# Patient Record
Sex: Male | Born: 1950 | Race: White | Hispanic: No | Marital: Married | Smoking: Never smoker
Health system: Southern US, Community
[De-identification: ages and names within clinical notes are randomized; demographics above are authoritative.]

## PROBLEM LIST (undated history)

## (undated) DIAGNOSIS — K2270 Barrett's esophagus without dysplasia: Secondary | ICD-10-CM

## (undated) DIAGNOSIS — E119 Type 2 diabetes mellitus without complications: Secondary | ICD-10-CM

## (undated) DIAGNOSIS — M1990 Unspecified osteoarthritis, unspecified site: Secondary | ICD-10-CM

## (undated) DIAGNOSIS — H409 Unspecified glaucoma: Secondary | ICD-10-CM

## (undated) DIAGNOSIS — K589 Irritable bowel syndrome without diarrhea: Secondary | ICD-10-CM

## (undated) DIAGNOSIS — I2699 Other pulmonary embolism without acute cor pulmonale: Secondary | ICD-10-CM

## (undated) DIAGNOSIS — Z9889 Other specified postprocedural states: Secondary | ICD-10-CM

## (undated) DIAGNOSIS — R0600 Dyspnea, unspecified: Secondary | ICD-10-CM

## (undated) DIAGNOSIS — F32A Depression, unspecified: Secondary | ICD-10-CM

## (undated) DIAGNOSIS — G8929 Other chronic pain: Secondary | ICD-10-CM

## (undated) DIAGNOSIS — C801 Malignant (primary) neoplasm, unspecified: Secondary | ICD-10-CM

## (undated) DIAGNOSIS — K5790 Diverticulosis of intestine, part unspecified, without perforation or abscess without bleeding: Secondary | ICD-10-CM

## (undated) DIAGNOSIS — E041 Nontoxic single thyroid nodule: Secondary | ICD-10-CM

## (undated) DIAGNOSIS — F329 Major depressive disorder, single episode, unspecified: Secondary | ICD-10-CM

## (undated) DIAGNOSIS — G4730 Sleep apnea, unspecified: Secondary | ICD-10-CM

## (undated) DIAGNOSIS — I499 Cardiac arrhythmia, unspecified: Secondary | ICD-10-CM

## (undated) DIAGNOSIS — M549 Dorsalgia, unspecified: Secondary | ICD-10-CM

## (undated) DIAGNOSIS — I4891 Unspecified atrial fibrillation: Secondary | ICD-10-CM

## (undated) DIAGNOSIS — K219 Gastro-esophageal reflux disease without esophagitis: Secondary | ICD-10-CM

## (undated) DIAGNOSIS — E162 Hypoglycemia, unspecified: Secondary | ICD-10-CM

## (undated) DIAGNOSIS — K5792 Diverticulitis of intestine, part unspecified, without perforation or abscess without bleeding: Secondary | ICD-10-CM

## (undated) DIAGNOSIS — I10 Essential (primary) hypertension: Secondary | ICD-10-CM

## (undated) DIAGNOSIS — E079 Disorder of thyroid, unspecified: Secondary | ICD-10-CM

## (undated) DIAGNOSIS — R011 Cardiac murmur, unspecified: Secondary | ICD-10-CM

## (undated) DIAGNOSIS — J189 Pneumonia, unspecified organism: Secondary | ICD-10-CM

## (undated) DIAGNOSIS — Z87442 Personal history of urinary calculi: Secondary | ICD-10-CM

## (undated) DIAGNOSIS — E785 Hyperlipidemia, unspecified: Secondary | ICD-10-CM

## (undated) DIAGNOSIS — J329 Chronic sinusitis, unspecified: Secondary | ICD-10-CM

## (undated) DIAGNOSIS — E0590 Thyrotoxicosis, unspecified without thyrotoxic crisis or storm: Secondary | ICD-10-CM

## (undated) HISTORY — PX: CARDIAC ELECTROPHYSIOLOGY MAPPING AND ABLATION: SHX1292

## (undated) HISTORY — PX: OTHER SURGICAL HISTORY: SHX169

## (undated) HISTORY — PX: TOOTH EXTRACTION: SUR596

## (undated) HISTORY — PX: COLON SURGERY: SHX602

## (undated) HISTORY — DX: Unspecified atrial fibrillation: I48.91

## (undated) HISTORY — DX: Gastro-esophageal reflux disease without esophagitis: K21.9

## (undated) HISTORY — DX: Type 2 diabetes mellitus without complications: E11.9

## (undated) HISTORY — DX: Essential (primary) hypertension: I10

## (undated) HISTORY — DX: Hyperlipidemia, unspecified: E78.5

## (undated) HISTORY — DX: Disorder of thyroid, unspecified: E07.9

## (undated) HISTORY — PX: TONSILLECTOMY: SUR1361

## (undated) HISTORY — PX: COLONOSCOPY: SHX174

## (undated) HISTORY — DX: Cardiac murmur, unspecified: R01.1

---
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
## 2006-09-28 ENCOUNTER — Other Ambulatory Visit: Payer: Self-pay

## 2006-09-28 ENCOUNTER — Inpatient Hospital Stay: Payer: Self-pay | Admitting: Unknown Physician Specialty

## 2006-10-27 ENCOUNTER — Ambulatory Visit: Payer: Self-pay | Admitting: Cardiology

## 2006-11-19 ENCOUNTER — Ambulatory Visit: Payer: Self-pay

## 2009-02-02 ENCOUNTER — Ambulatory Visit: Payer: Self-pay | Admitting: Family Medicine

## 2009-02-20 ENCOUNTER — Ambulatory Visit: Payer: Self-pay | Admitting: Unknown Physician Specialty

## 2009-03-13 ENCOUNTER — Inpatient Hospital Stay: Payer: Self-pay | Admitting: Internal Medicine

## 2009-04-18 ENCOUNTER — Ambulatory Visit: Payer: Self-pay | Admitting: Family Medicine

## 2009-06-12 ENCOUNTER — Ambulatory Visit: Payer: Self-pay | Admitting: Gastroenterology

## 2009-06-29 ENCOUNTER — Ambulatory Visit: Payer: Self-pay | Admitting: Gastroenterology

## 2010-02-05 ENCOUNTER — Inpatient Hospital Stay: Payer: Self-pay | Admitting: Internal Medicine

## 2010-05-23 ENCOUNTER — Ambulatory Visit: Payer: Self-pay | Admitting: Gastroenterology

## 2010-06-11 ENCOUNTER — Ambulatory Visit: Payer: Self-pay | Admitting: Unknown Physician Specialty

## 2010-07-24 ENCOUNTER — Ambulatory Visit: Payer: Self-pay | Admitting: Gastroenterology

## 2010-07-29 LAB — PATHOLOGY REPORT

## 2010-08-14 ENCOUNTER — Ambulatory Visit: Payer: Self-pay | Admitting: Gastroenterology

## 2011-06-26 ENCOUNTER — Ambulatory Visit: Payer: Self-pay | Admitting: Internal Medicine

## 2011-10-02 ENCOUNTER — Ambulatory Visit: Payer: Self-pay | Admitting: Gastroenterology

## 2012-05-16 HISTORY — PX: OTHER SURGICAL HISTORY: SHX169

## 2012-05-26 ENCOUNTER — Inpatient Hospital Stay: Payer: Self-pay | Admitting: Surgery

## 2012-05-26 LAB — COMPREHENSIVE METABOLIC PANEL
Alkaline Phosphatase: 80 U/L (ref 50–136)
Anion Gap: 8 (ref 7–16)
BUN: 9 mg/dL (ref 7–18)
Bilirubin,Total: 1.2 mg/dL — ABNORMAL HIGH (ref 0.2–1.0)
Chloride: 101 mmol/L (ref 98–107)
Co2: 27 mmol/L (ref 21–32)
Creatinine: 0.88 mg/dL (ref 0.60–1.30)
Potassium: 3.7 mmol/L (ref 3.5–5.1)

## 2012-05-26 LAB — URINALYSIS, COMPLETE
Bacteria: NONE SEEN
Blood: NEGATIVE
Glucose,UR: NEGATIVE mg/dL (ref 0–75)
Leukocyte Esterase: NEGATIVE
RBC,UR: <1 /HPF (ref 0–5)
Squamous Epithelial: NONE SEEN
WBC UR: <1 /HPF (ref 0–5)

## 2012-05-26 LAB — CBC
HCT: 45.6 % (ref 40.0–52.0)
HGB: 15.2 g/dL (ref 13.0–18.0)
MCH: 33.6 pg (ref 26.0–34.0)
MCHC: 33.4 g/dL (ref 32.0–36.0)
Platelet: 331 10*3/uL (ref 150–440)
RDW: 13.8 % (ref 11.5–14.5)

## 2012-05-26 LAB — LIPASE, BLOOD: Lipase: 58 U/L — ABNORMAL LOW (ref 73–393)

## 2012-05-27 LAB — CBC WITH DIFFERENTIAL/PLATELET
Basophil %: 0.2 %
Eosinophil #: 0 10*3/uL (ref 0.0–0.7)
Eosinophil %: 0 %
Lymphocyte %: 8.1 %
MCH: 33.2 pg (ref 26.0–34.0)
MCHC: 33.1 g/dL (ref 32.0–36.0)
MCV: 100 fL (ref 80–100)
Monocyte #: 1.1 x10 3/mm — ABNORMAL HIGH (ref 0.2–1.0)
Monocyte %: 6 %
Neutrophil #: 15.5 10*3/uL — ABNORMAL HIGH (ref 1.4–6.5)
Neutrophil %: 85.7 %
RDW: 13.6 % (ref 11.5–14.5)
WBC: 18.1 10*3/uL — ABNORMAL HIGH (ref 3.8–10.6)

## 2012-05-27 LAB — BASIC METABOLIC PANEL
Anion Gap: 10 (ref 7–16)
BUN: 8 mg/dL (ref 7–18)
Chloride: 101 mmol/L (ref 98–107)
Creatinine: 0.9 mg/dL (ref 0.60–1.30)
Glucose: 133 mg/dL — ABNORMAL HIGH (ref 65–99)
Potassium: 3.2 mmol/L — ABNORMAL LOW (ref 3.5–5.1)

## 2012-05-28 LAB — CBC WITH DIFFERENTIAL/PLATELET
Basophil #: 0.1 10*3/uL (ref 0.0–0.1)
Basophil %: 0.4 %
Eosinophil #: 0.1 10*3/uL (ref 0.0–0.7)
Eosinophil %: 0.3 %
HCT: 36 % — ABNORMAL LOW (ref 40.0–52.0)
HGB: 12.3 g/dL — ABNORMAL LOW (ref 13.0–18.0)
Lymphocyte #: 2.2 10*3/uL (ref 1.0–3.6)
MCH: 34.7 pg — ABNORMAL HIGH (ref 26.0–34.0)
MCV: 101 fL — ABNORMAL HIGH (ref 80–100)
Monocyte %: 5.2 %
Neutrophil %: 85.2 %
Platelet: 245 10*3/uL (ref 150–440)
RBC: 3.56 10*6/uL — ABNORMAL LOW (ref 4.40–5.90)

## 2012-05-28 LAB — BASIC METABOLIC PANEL
Anion Gap: 9 (ref 7–16)
BUN: 6 mg/dL — ABNORMAL LOW (ref 7–18)
Calcium, Total: 8.4 mg/dL — ABNORMAL LOW (ref 8.5–10.1)
Chloride: 104 mmol/L (ref 98–107)
Creatinine: 0.8 mg/dL (ref 0.60–1.30)
EGFR (African American): >60
Glucose: 102 mg/dL — ABNORMAL HIGH (ref 65–99)
Sodium: 136 mmol/L (ref 136–145)

## 2012-05-28 LAB — T4, FREE: Free Thyroxine: 0.99 ng/dL (ref 0.76–1.46)

## 2012-05-28 LAB — MAGNESIUM: Magnesium: 1.6 mg/dL — ABNORMAL LOW

## 2012-05-28 LAB — TROPONIN I: Troponin-I: <0.02 ng/mL

## 2012-05-29 LAB — CK TOTAL AND CKMB (NOT AT ARMC)
CK, Total: 80 U/L (ref 35–232)
CK, Total: 79 U/L (ref 35–232)
CK-MB: <0.5 ng/mL — ABNORMAL LOW (ref 0.5–3.6)
CK-MB: <0.5 ng/mL — ABNORMAL LOW (ref 0.5–3.6)

## 2012-05-29 LAB — CBC WITH DIFFERENTIAL/PLATELET
Eosinophil #: 0.1 10*3/uL (ref 0.0–0.7)
Eosinophil %: 0.8 %
HCT: 36.7 % — ABNORMAL LOW (ref 40.0–52.0)
HGB: 12 g/dL — ABNORMAL LOW (ref 13.0–18.0)
MCH: 33.3 pg (ref 26.0–34.0)
MCV: 102 fL — ABNORMAL HIGH (ref 80–100)
Monocyte #: 1.1 x10 3/mm — ABNORMAL HIGH (ref 0.2–1.0)
Monocyte %: 6.1 %
Neutrophil #: 15.8 10*3/uL — ABNORMAL HIGH (ref 1.4–6.5)
RDW: 13.9 % (ref 11.5–14.5)
WBC: 18.8 10*3/uL — ABNORMAL HIGH (ref 3.8–10.6)

## 2012-05-29 LAB — BASIC METABOLIC PANEL
Chloride: 103 mmol/L (ref 98–107)
Co2: 24 mmol/L (ref 21–32)
Creatinine: 0.69 mg/dL (ref 0.60–1.30)
Glucose: 101 mg/dL — ABNORMAL HIGH (ref 65–99)
Osmolality: 270 (ref 275–301)
Sodium: 136 mmol/L (ref 136–145)

## 2012-05-29 LAB — TROPONIN I: Troponin-I: <0.02 ng/mL

## 2012-05-30 LAB — BASIC METABOLIC PANEL
Anion Gap: 9 (ref 7–16)
BUN: 8 mg/dL (ref 7–18)
Chloride: 100 mmol/L (ref 98–107)
Creatinine: 0.72 mg/dL (ref 0.60–1.30)
Glucose: 91 mg/dL (ref 65–99)
Osmolality: 264 (ref 275–301)
Potassium: 3.9 mmol/L (ref 3.5–5.1)

## 2012-05-31 LAB — CBC WITH DIFFERENTIAL/PLATELET
Basophil #: 0.1 10*3/uL (ref 0.0–0.1)
Basophil %: 0.5 %
Eosinophil #: 0 10*3/uL (ref 0.0–0.7)
Eosinophil %: 0.2 %
Lymphocyte %: 8.9 %
MCH: 34.2 pg — ABNORMAL HIGH (ref 26.0–34.0)
MCHC: 33.5 g/dL (ref 32.0–36.0)
Monocyte #: 1.4 x10 3/mm — ABNORMAL HIGH (ref 0.2–1.0)
Monocyte %: 8.6 %
Neutrophil #: 13.2 10*3/uL — ABNORMAL HIGH (ref 1.4–6.5)
Neutrophil %: 81.8 %
Platelet: 339 10*3/uL (ref 150–440)
RBC: 3.85 10*6/uL — ABNORMAL LOW (ref 4.40–5.90)
WBC: 16.2 10*3/uL — ABNORMAL HIGH (ref 3.8–10.6)

## 2012-05-31 LAB — COMPREHENSIVE METABOLIC PANEL
Albumin: 2 g/dL — ABNORMAL LOW (ref 3.4–5.0)
BUN: 10 mg/dL (ref 7–18)
Bilirubin,Total: 1 mg/dL (ref 0.2–1.0)
Calcium, Total: 7.9 mg/dL — ABNORMAL LOW (ref 8.5–10.1)
Co2: 24 mmol/L (ref 21–32)
Creatinine: 0.6 mg/dL (ref 0.60–1.30)
SGOT(AST): 19 U/L (ref 15–37)
Sodium: 137 mmol/L (ref 136–145)
Total Protein: 6 g/dL — ABNORMAL LOW (ref 6.4–8.2)

## 2012-06-01 LAB — CBC WITH DIFFERENTIAL/PLATELET
Basophil: 2 %
Eosinophil: 1 %
HCT: 36.9 % — ABNORMAL LOW (ref 40.0–52.0)
Lymphocytes: 14 %
Monocytes: 12 %
Myelocyte: 3 %
Other Cells Blood: 1
Platelet: 419 10*3/uL (ref 150–440)
RBC: 3.65 10*6/uL — ABNORMAL LOW (ref 4.40–5.90)
WBC: 16.6 10*3/uL — ABNORMAL HIGH (ref 3.8–10.6)

## 2012-06-01 LAB — BASIC METABOLIC PANEL
Calcium, Total: 7.9 mg/dL — ABNORMAL LOW (ref 8.5–10.1)
Creatinine: 0.68 mg/dL (ref 0.60–1.30)
EGFR (Non-African Amer.): >60
Glucose: 113 mg/dL — ABNORMAL HIGH (ref 65–99)
Osmolality: 273 (ref 275–301)
Potassium: 4 mmol/L (ref 3.5–5.1)

## 2012-06-03 LAB — PATHOLOGY REPORT

## 2012-06-05 DIAGNOSIS — R079 Chest pain, unspecified: Secondary | ICD-10-CM

## 2012-06-05 LAB — COMPREHENSIVE METABOLIC PANEL
Albumin: 2.1 g/dL — ABNORMAL LOW (ref 3.4–5.0)
Alkaline Phosphatase: 117 U/L (ref 50–136)
Anion Gap: 9 (ref 7–16)
BUN: 4 mg/dL — ABNORMAL LOW (ref 7–18)
Calcium, Total: 7.9 mg/dL — ABNORMAL LOW (ref 8.5–10.1)
Chloride: 103 mmol/L (ref 98–107)
EGFR (Non-African Amer.): >60
Potassium: 2.9 mmol/L — ABNORMAL LOW (ref 3.5–5.1)
SGPT (ALT): 34 U/L
Sodium: 140 mmol/L (ref 136–145)

## 2012-06-05 LAB — CBC WITH DIFFERENTIAL/PLATELET
Basophil #: 0.2 10*3/uL — ABNORMAL HIGH (ref 0.0–0.1)
Basophil %: 0.6 %
Eosinophil #: 0.7 10*3/uL (ref 0.0–0.7)
Eosinophil %: 2.3 %
HCT: 35.9 % — ABNORMAL LOW (ref 40.0–52.0)
HGB: 11.7 g/dL — ABNORMAL LOW (ref 13.0–18.0)
Lymphocyte #: 1.5 10*3/uL (ref 1.0–3.6)
MCV: 101 fL — ABNORMAL HIGH (ref 80–100)
Monocyte #: 1.3 x10 3/mm — ABNORMAL HIGH (ref 0.2–1.0)
Monocyte %: 4.4 %
Neutrophil %: 87.7 %
Platelet: 762 10*3/uL — ABNORMAL HIGH (ref 150–440)
RBC: 3.56 10*6/uL — ABNORMAL LOW (ref 4.40–5.90)
WBC: 29.7 10*3/uL — ABNORMAL HIGH (ref 3.8–10.6)

## 2012-06-06 LAB — CBC WITH DIFFERENTIAL/PLATELET
Basophil %: 0.4 %
Eosinophil #: 0.9 10*3/uL — ABNORMAL HIGH (ref 0.0–0.7)
Lymphocyte #: 1.7 10*3/uL (ref 1.0–3.6)
Lymphocyte %: 7.5 %
MCH: 34.1 pg — ABNORMAL HIGH (ref 26.0–34.0)
MCV: 100 fL (ref 80–100)
Platelet: 791 10*3/uL — ABNORMAL HIGH (ref 150–440)
WBC: 22.4 10*3/uL — ABNORMAL HIGH (ref 3.8–10.6)

## 2012-06-06 LAB — BASIC METABOLIC PANEL
BUN: 4 mg/dL — ABNORMAL LOW (ref 7–18)
Calcium, Total: 8.2 mg/dL — ABNORMAL LOW (ref 8.5–10.1)
Co2: 27 mmol/L (ref 21–32)
Creatinine: 0.93 mg/dL (ref 0.60–1.30)
EGFR (African American): >60
EGFR (Non-African Amer.): >60
Osmolality: 278 (ref 275–301)
Potassium: 3.5 mmol/L (ref 3.5–5.1)
Sodium: 141 mmol/L (ref 136–145)

## 2012-06-07 LAB — CBC WITH DIFFERENTIAL/PLATELET
Basophil #: 0.1 10*3/uL (ref 0.0–0.1)
Basophil %: 0.5 %
Eosinophil #: 0.6 10*3/uL (ref 0.0–0.7)
Eosinophil %: 3.4 %
HCT: 33.1 % — ABNORMAL LOW (ref 40.0–52.0)
Lymphocyte #: 1.9 10*3/uL (ref 1.0–3.6)
MCH: 33.3 pg (ref 26.0–34.0)
MCV: 100 fL (ref 80–100)
Monocyte %: 9.5 %
Neutrophil #: 13.1 10*3/uL — ABNORMAL HIGH (ref 1.4–6.5)
Neutrophil %: 75.6 %
Platelet: 811 10*3/uL — ABNORMAL HIGH (ref 150–440)
RBC: 3.31 10*6/uL — ABNORMAL LOW (ref 4.40–5.90)
WBC: 17.3 10*3/uL — ABNORMAL HIGH (ref 3.8–10.6)

## 2012-06-07 LAB — BASIC METABOLIC PANEL
BUN: 4 mg/dL — ABNORMAL LOW (ref 7–18)
Calcium, Total: 8.1 mg/dL — ABNORMAL LOW (ref 8.5–10.1)
Chloride: 104 mmol/L (ref 98–107)
Creatinine: 0.97 mg/dL (ref 0.60–1.30)
EGFR (African American): >60
EGFR (Non-African Amer.): >60
Glucose: 103 mg/dL — ABNORMAL HIGH (ref 65–99)
Osmolality: 275 (ref 275–301)
Sodium: 139 mmol/L (ref 136–145)

## 2012-06-09 LAB — CBC WITH DIFFERENTIAL/PLATELET
Basophil #: 0.2 10*3/uL — ABNORMAL HIGH (ref 0.0–0.1)
Basophil %: 1.2 %
Eosinophil #: 0.6 10*3/uL (ref 0.0–0.7)
HCT: 35.5 % — ABNORMAL LOW (ref 40.0–52.0)
Lymphocyte %: 20.3 %
MCH: 33.2 pg (ref 26.0–34.0)
MCV: 101 fL — ABNORMAL HIGH (ref 80–100)
Monocyte #: 1.5 x10 3/mm — ABNORMAL HIGH (ref 0.2–1.0)
Monocyte %: 10.4 %
Neutrophil #: 9.1 10*3/uL — ABNORMAL HIGH (ref 1.4–6.5)
Neutrophil %: 63.7 %
Platelet: 944 10*3/uL — ABNORMAL HIGH (ref 150–440)
RDW: 14.5 % (ref 11.5–14.5)

## 2012-06-13 LAB — WOUND AEROBIC CULTURE

## 2012-06-15 HISTORY — PX: OTHER SURGICAL HISTORY: SHX169

## 2012-06-17 ENCOUNTER — Ambulatory Visit: Payer: Self-pay | Admitting: Surgery

## 2012-06-17 ENCOUNTER — Inpatient Hospital Stay: Payer: Self-pay | Admitting: Surgery

## 2012-06-17 LAB — CBC WITH DIFFERENTIAL/PLATELET
Basophil #: 0.2 10*3/uL — ABNORMAL HIGH (ref 0.0–0.1)
Eosinophil %: 3.9 %
HCT: 37 % — ABNORMAL LOW (ref 40.0–52.0)
HGB: 12.3 g/dL — ABNORMAL LOW (ref 13.0–18.0)
Lymphocyte %: 18.3 %
Monocyte %: 7.8 %
Neutrophil #: 8.8 10*3/uL — ABNORMAL HIGH (ref 1.4–6.5)
RBC: 3.74 10*6/uL — ABNORMAL LOW (ref 4.40–5.90)
RDW: 13.8 % (ref 11.5–14.5)

## 2012-06-17 LAB — BASIC METABOLIC PANEL
BUN: 8 mg/dL (ref 7–18)
Chloride: 103 mmol/L (ref 98–107)
Glucose: 97 mg/dL (ref 65–99)
Osmolality: 272 (ref 275–301)
Potassium: 4.2 mmol/L (ref 3.5–5.1)

## 2012-06-18 LAB — BASIC METABOLIC PANEL
Anion Gap: 8 (ref 7–16)
BUN: 6 mg/dL — ABNORMAL LOW (ref 7–18)
Calcium, Total: 8.4 mg/dL — ABNORMAL LOW (ref 8.5–10.1)
Chloride: 104 mmol/L (ref 98–107)
Co2: 26 mmol/L (ref 21–32)
Creatinine: 0.89 mg/dL (ref 0.60–1.30)
Osmolality: 273 (ref 275–301)
Potassium: 3.7 mmol/L (ref 3.5–5.1)
Sodium: 138 mmol/L (ref 136–145)

## 2012-06-18 LAB — CBC WITH DIFFERENTIAL/PLATELET
Basophil #: 0.2 10*3/uL — ABNORMAL HIGH (ref 0.0–0.1)
Basophil %: 2 %
Eosinophil %: 5.8 %
HCT: 33.9 % — ABNORMAL LOW (ref 40.0–52.0)
HGB: 11.5 g/dL — ABNORMAL LOW (ref 13.0–18.0)
Lymphocyte #: 3.4 10*3/uL (ref 1.0–3.6)
Lymphocyte %: 28.1 %
MCV: 98 fL (ref 80–100)
Monocyte #: 0.8 x10 3/mm (ref 0.2–1.0)
Monocyte %: 6.5 %
Neutrophil %: 57.6 %
Platelet: 553 10*3/uL — ABNORMAL HIGH (ref 150–440)
RBC: 3.47 10*6/uL — ABNORMAL LOW (ref 4.40–5.90)
RDW: 13.7 % (ref 11.5–14.5)
WBC: 12 10*3/uL — ABNORMAL HIGH (ref 3.8–10.6)

## 2012-06-21 LAB — WOUND CULTURE

## 2012-06-22 LAB — PLATELET COUNT: Platelet: 413 10*3/uL (ref 150–440)

## 2012-06-23 LAB — CBC WITH DIFFERENTIAL/PLATELET
Basophil #: 0.1 10*3/uL (ref 0.0–0.1)
Basophil %: 1.4 %
Eosinophil #: 1.1 10*3/uL — ABNORMAL HIGH (ref 0.0–0.7)
HCT: 36.1 % — ABNORMAL LOW (ref 40.0–52.0)
HGB: 12.3 g/dL — ABNORMAL LOW (ref 13.0–18.0)
Lymphocyte %: 19.1 %
MCHC: 34.1 g/dL (ref 32.0–36.0)
Monocyte %: 11.2 %
Neutrophil %: 57.6 %
Platelet: 425 10*3/uL (ref 150–440)
RBC: 3.72 10*6/uL — ABNORMAL LOW (ref 4.40–5.90)
RDW: 14.1 % (ref 11.5–14.5)
WBC: 10.1 10*3/uL (ref 3.8–10.6)

## 2012-06-23 LAB — APTT: Activated PTT: 32 secs (ref 23.6–35.9)

## 2012-06-24 LAB — CBC WITH DIFFERENTIAL/PLATELET
Basophil #: 0.1 10*3/uL (ref 0.0–0.1)
Basophil %: 1.3 %
Eosinophil #: 1.3 10*3/uL — ABNORMAL HIGH (ref 0.0–0.7)
Eosinophil %: 12.9 %
HGB: 11.3 g/dL — ABNORMAL LOW (ref 13.0–18.0)
Lymphocyte #: 3.1 10*3/uL (ref 1.0–3.6)
Lymphocyte %: 31 %
MCH: 32.6 pg (ref 26.0–34.0)
MCV: 98 fL (ref 80–100)
Monocyte %: 11.2 %
Neutrophil %: 43.6 %
Platelet: 378 10*3/uL (ref 150–440)
RBC: 3.48 10*6/uL — ABNORMAL LOW (ref 4.40–5.90)
WBC: 9.9 10*3/uL (ref 3.8–10.6)

## 2012-06-24 LAB — PROTIME-INR
INR: 1
Prothrombin Time: 14 secs (ref 11.5–14.7)

## 2012-06-25 LAB — CBC WITH DIFFERENTIAL/PLATELET
Basophil %: 1.2 %
Eosinophil %: 14.8 %
HCT: 35.8 % — ABNORMAL LOW (ref 40.0–52.0)
HGB: 11.7 g/dL — ABNORMAL LOW (ref 13.0–18.0)
Lymphocyte %: 28.2 %
MCHC: 32.8 g/dL (ref 32.0–36.0)
Monocyte %: 11.4 %
Neutrophil #: 4.2 10*3/uL (ref 1.4–6.5)
Neutrophil %: 44.4 %
Platelet: 409 10*3/uL (ref 150–440)
RBC: 3.64 10*6/uL — ABNORMAL LOW (ref 4.40–5.90)
WBC: 9.5 10*3/uL (ref 3.8–10.6)

## 2012-06-25 LAB — BASIC METABOLIC PANEL
Calcium, Total: 8.7 mg/dL (ref 8.5–10.1)
Co2: 27 mmol/L (ref 21–32)
EGFR (Non-African Amer.): >60
Glucose: 97 mg/dL (ref 65–99)
Osmolality: 274 (ref 275–301)
Potassium: 3.7 mmol/L (ref 3.5–5.1)

## 2012-06-25 LAB — CK TOTAL AND CKMB (NOT AT ARMC)
CK, Total: 35 U/L (ref 35–232)
CK, Total: 40 U/L (ref 35–232)
CK-MB: <0.5 ng/mL — ABNORMAL LOW (ref 0.5–3.6)

## 2012-06-25 LAB — APTT: Activated PTT: 49.2 secs — ABNORMAL HIGH (ref 23.6–35.9)

## 2012-06-25 LAB — TROPONIN I: Troponin-I: <0.02 ng/mL

## 2012-06-26 LAB — BASIC METABOLIC PANEL
Anion Gap: 8 (ref 7–16)
BUN: 3 mg/dL — ABNORMAL LOW (ref 7–18)
Calcium, Total: 8.8 mg/dL (ref 8.5–10.1)
Chloride: 104 mmol/L (ref 98–107)
Co2: 28 mmol/L (ref 21–32)
Creatinine: 0.7 mg/dL (ref 0.60–1.30)
EGFR (African American): >60
EGFR (Non-African Amer.): >60
Glucose: 102 mg/dL — ABNORMAL HIGH (ref 65–99)
Osmolality: 276 (ref 275–301)
Potassium: 3.7 mmol/L (ref 3.5–5.1)
Sodium: 140 mmol/L (ref 136–145)

## 2012-06-26 LAB — CBC WITH DIFFERENTIAL/PLATELET
Basophil #: 0.2 x10 3/mm 3 — ABNORMAL HIGH
Basophil %: 2.6 %
Eosinophil #: 1.2 x10 3/mm 3 — ABNORMAL HIGH
Eosinophil %: 14.2 %
HCT: 35.1 % — ABNORMAL LOW
HGB: 12.2 g/dL — ABNORMAL LOW
Lymphocyte %: 29.3 %
Lymphs Abs: 2.5 x10 3/mm 3
MCH: 33.5 pg
MCHC: 34.7 g/dL
MCV: 97 fL
Monocyte #: 1 "x10 3/mm "
Monocyte %: 10.9 %
Neutrophil #: 3.7 x10 3/mm 3
Neutrophil %: 43 %
Platelet: 397 x10 3/mm 3
RBC: 3.64 x10 6/mm 3 — ABNORMAL LOW
RDW: 14.2 %
WBC: 8.7 x10 3/mm 3

## 2012-06-26 LAB — APTT
Activated PTT: 51.2 s — ABNORMAL HIGH
Activated PTT: 68.3 secs — ABNORMAL HIGH (ref 23.6–35.9)

## 2012-06-26 LAB — CK TOTAL AND CKMB (NOT AT ARMC): CK-MB: <0.5 ng/mL — ABNORMAL LOW (ref 0.5–3.6)

## 2012-06-26 LAB — TROPONIN I: Troponin-I: <0.02 ng/mL

## 2012-12-14 LAB — URINALYSIS, COMPLETE
Bacteria: NONE SEEN
Bilirubin,UR: NEGATIVE
Blood: NEGATIVE
Glucose,UR: NEGATIVE mg/dL (ref 0–75)
Ketone: NEGATIVE
Nitrite: NEGATIVE
Protein: NEGATIVE
RBC,UR: <1 /HPF (ref 0–5)
Specific Gravity: 1.003 (ref 1.003–1.030)
Squamous Epithelial: <1
WBC UR: 1 /HPF (ref 0–5)

## 2012-12-14 LAB — LIPASE, BLOOD: Lipase: 66 U/L — ABNORMAL LOW (ref 73–393)

## 2012-12-14 LAB — COMPREHENSIVE METABOLIC PANEL
Anion Gap: 10 (ref 7–16)
BUN: 12 mg/dL (ref 7–18)
Calcium, Total: 9 mg/dL (ref 8.5–10.1)
Chloride: 103 mmol/L (ref 98–107)
Co2: 24 mmol/L (ref 21–32)
EGFR (African American): >60
EGFR (Non-African Amer.): >60
Potassium: 3.8 mmol/L (ref 3.5–5.1)
SGOT(AST): 36 U/L (ref 15–37)
Sodium: 137 mmol/L (ref 136–145)

## 2012-12-14 LAB — APTT: Activated PTT: 28.1 secs (ref 23.6–35.9)

## 2012-12-14 LAB — CBC
HGB: 13.3 g/dL (ref 13.0–18.0)
MCH: 32.4 pg (ref 26.0–34.0)
MCV: 97 fL (ref 80–100)
RBC: 4.1 10*6/uL — ABNORMAL LOW (ref 4.40–5.90)
RDW: 13.8 % (ref 11.5–14.5)
WBC: 13.3 10*3/uL — ABNORMAL HIGH (ref 3.8–10.6)

## 2012-12-15 ENCOUNTER — Inpatient Hospital Stay: Payer: Self-pay

## 2012-12-15 LAB — HEMOGLOBIN
HGB: 12.4 g/dL — ABNORMAL LOW (ref 13.0–18.0)
HGB: 13.5 g/dL (ref 13.0–18.0)

## 2012-12-16 LAB — CBC WITH DIFFERENTIAL/PLATELET
Eosinophil #: 0.6 10*3/uL (ref 0.0–0.7)
Eosinophil %: 6.2 %
HGB: 12.4 g/dL — ABNORMAL LOW (ref 13.0–18.0)
Lymphocyte #: 3.2 10*3/uL (ref 1.0–3.6)
Lymphocyte %: 32.8 %
MCHC: 33.4 g/dL (ref 32.0–36.0)
Monocyte #: 0.9 x10 3/mm (ref 0.2–1.0)
Neutrophil #: 4.9 10*3/uL (ref 1.4–6.5)
Platelet: 284 10*3/uL (ref 150–440)

## 2012-12-16 LAB — BASIC METABOLIC PANEL
Anion Gap: 10 (ref 7–16)
BUN: 8 mg/dL (ref 7–18)
Chloride: 104 mmol/L (ref 98–107)
Co2: 24 mmol/L (ref 21–32)
EGFR (African American): >60
EGFR (Non-African Amer.): >60
Osmolality: 273 (ref 275–301)
Sodium: 138 mmol/L (ref 136–145)

## 2012-12-28 ENCOUNTER — Ambulatory Visit: Payer: Self-pay | Admitting: Surgery

## 2013-01-16 HISTORY — PX: OTHER SURGICAL HISTORY: SHX169

## 2013-01-19 ENCOUNTER — Ambulatory Visit: Payer: Self-pay | Admitting: Surgery

## 2013-01-19 LAB — PROTIME-INR
INR: 1.1
Prothrombin Time: 14.9 secs — ABNORMAL HIGH (ref 11.5–14.7)

## 2013-01-26 ENCOUNTER — Inpatient Hospital Stay: Payer: Self-pay | Admitting: Surgery

## 2013-01-26 LAB — PROTIME-INR: INR: 1

## 2013-01-27 LAB — CBC WITH DIFFERENTIAL/PLATELET
Basophil #: 0 10*3/uL (ref 0.0–0.1)
Basophil %: 0.3 %
Eosinophil #: 0 10*3/uL (ref 0.0–0.7)
Eosinophil %: 0.1 %
HCT: 37.7 % — ABNORMAL LOW (ref 40.0–52.0)
Lymphocyte #: 1.7 10*3/uL (ref 1.0–3.6)
MCH: 32.4 pg (ref 26.0–34.0)
Monocyte #: 1.6 x10 3/mm — ABNORMAL HIGH (ref 0.2–1.0)
Monocyte %: 11 %
Neutrophil #: 11.2 10*3/uL — ABNORMAL HIGH (ref 1.4–6.5)
Neutrophil %: 76.6 %
RDW: 14.1 % (ref 11.5–14.5)
WBC: 14.6 10*3/uL — ABNORMAL HIGH (ref 3.8–10.6)

## 2013-01-27 LAB — BASIC METABOLIC PANEL
BUN: 7 mg/dL (ref 7–18)
Chloride: 105 mmol/L (ref 98–107)
Co2: 24 mmol/L (ref 21–32)
EGFR (African American): >60
Glucose: 128 mg/dL — ABNORMAL HIGH (ref 65–99)
Potassium: 3.2 mmol/L — ABNORMAL LOW (ref 3.5–5.1)

## 2013-01-28 LAB — CBC WITH DIFFERENTIAL/PLATELET
Basophil #: 0.1 10*3/uL (ref 0.0–0.1)
Basophil %: 0.4 %
Eosinophil #: 0 10*3/uL (ref 0.0–0.7)
HGB: 12.3 g/dL — ABNORMAL LOW (ref 13.0–18.0)
Lymphocyte %: 13.4 %
MCV: 97 fL (ref 80–100)
Monocyte #: 2.9 x10 3/mm — ABNORMAL HIGH (ref 0.2–1.0)
Monocyte %: 14.8 %
Neutrophil %: 71.3 %
RDW: 14.2 % (ref 11.5–14.5)
WBC: 19.3 10*3/uL — ABNORMAL HIGH (ref 3.8–10.6)

## 2013-01-28 LAB — BASIC METABOLIC PANEL WITH GFR
Anion Gap: 6 — ABNORMAL LOW (ref 7–16)
BUN: 5 mg/dL — ABNORMAL LOW (ref 7–18)
Calcium, Total: 8.3 mg/dL — ABNORMAL LOW (ref 8.5–10.1)
Chloride: 105 mmol/L (ref 98–107)
Co2: 25 mmol/L (ref 21–32)
Creatinine: 0.8 mg/dL (ref 0.60–1.30)
EGFR (African American): >60
EGFR (Non-African Amer.): >60
Glucose: 112 mg/dL — ABNORMAL HIGH (ref 65–99)
Osmolality: 270 (ref 275–301)
Potassium: 3.7 mmol/L (ref 3.5–5.1)
Sodium: 136 mmol/L (ref 136–145)

## 2013-01-28 LAB — MAGNESIUM: Magnesium: 1.4 mg/dL — ABNORMAL LOW

## 2013-01-29 LAB — CBC WITH DIFFERENTIAL/PLATELET
Eosinophil #: 0.4 10*3/uL (ref 0.0–0.7)
Eosinophil %: 2.5 %
HGB: 11.8 g/dL — ABNORMAL LOW (ref 13.0–18.0)
Lymphocyte %: 11.8 %
MCV: 98 fL (ref 80–100)
Monocyte #: 1.6 x10 3/mm — ABNORMAL HIGH (ref 0.2–1.0)
Neutrophil #: 11.5 10*3/uL — ABNORMAL HIGH (ref 1.4–6.5)
Platelet: 283 10*3/uL (ref 150–440)
RDW: 14.1 % (ref 11.5–14.5)

## 2013-01-29 LAB — BASIC METABOLIC PANEL
Anion Gap: 7 (ref 7–16)
Calcium, Total: 8.3 mg/dL — ABNORMAL LOW (ref 8.5–10.1)
Chloride: 107 mmol/L (ref 98–107)
EGFR (African American): >60
EGFR (Non-African Amer.): >60
Glucose: 122 mg/dL — ABNORMAL HIGH (ref 65–99)
Osmolality: 272 (ref 275–301)

## 2013-01-29 LAB — PATHOLOGY REPORT

## 2013-01-30 LAB — BASIC METABOLIC PANEL
Calcium, Total: 8.3 mg/dL — ABNORMAL LOW (ref 8.5–10.1)
Chloride: 107 mmol/L (ref 98–107)
Co2: 24 mmol/L (ref 21–32)
Creatinine: 0.7 mg/dL (ref 0.60–1.30)
EGFR (African American): >60

## 2013-01-30 LAB — CBC WITH DIFFERENTIAL/PLATELET
Basophil #: 0.2 10*3/uL — ABNORMAL HIGH (ref 0.0–0.1)
Basophil %: 1.3 %
Eosinophil #: 0.7 10*3/uL (ref 0.0–0.7)
Eosinophil %: 5.5 %
HGB: 11.9 g/dL — ABNORMAL LOW (ref 13.0–18.0)
MCH: 33.3 pg (ref 26.0–34.0)
MCHC: 34.1 g/dL (ref 32.0–36.0)
MCV: 98 fL (ref 80–100)
Neutrophil %: 61.3 %
Platelet: 327 10*3/uL (ref 150–440)
RDW: 14.3 % (ref 11.5–14.5)

## 2013-04-29 ENCOUNTER — Ambulatory Visit: Payer: Self-pay | Admitting: Surgery

## 2013-04-29 LAB — COMPREHENSIVE METABOLIC PANEL
Albumin: 3.4 g/dL (ref 3.4–5.0)
Alkaline Phosphatase: 87 U/L (ref 50–136)
BUN: 8 mg/dL (ref 7–18)
Bilirubin,Total: 0.3 mg/dL (ref 0.2–1.0)
Chloride: 103 mmol/L (ref 98–107)
Co2: 28 mmol/L (ref 21–32)
Creatinine: 0.88 mg/dL (ref 0.60–1.30)
EGFR (Non-African Amer.): >60
SGPT (ALT): 17 U/L (ref 12–78)
Sodium: 140 mmol/L (ref 136–145)

## 2013-04-29 LAB — CBC WITH DIFFERENTIAL/PLATELET
Basophil #: 0.2 10*3/uL — ABNORMAL HIGH (ref 0.0–0.1)
Basophil %: 1.7 %
Lymphocyte %: 22.1 %
Monocyte #: 0.8 x10 3/mm (ref 0.2–1.0)
Monocyte %: 7.6 %
Neutrophil %: 59.5 %
RDW: 17.3 % — ABNORMAL HIGH (ref 11.5–14.5)
WBC: 10.5 10*3/uL (ref 3.8–10.6)

## 2013-04-30 ENCOUNTER — Ambulatory Visit: Payer: Self-pay | Admitting: Surgery

## 2013-08-03 ENCOUNTER — Ambulatory Visit: Payer: Self-pay | Admitting: Gastroenterology

## 2013-08-04 LAB — PATHOLOGY REPORT

## 2013-11-16 IMAGING — CT CT ABD-PELV W/ CM
1 of 4 series · 11 of 32 positions shown, 17 images · IV contrast (isovue)
Comparison: none

REASON FOR EXAM: (1) LLQ PAIN; (2) LLQ PAIN
COMMENTS:

PROCEDURE:     CT  - CT ABDOMEN / PELVIS  W  - May 26, 2012  [DATE]
RESULT:     Comparison:  05/23/2010
TECHNIQUE: Multiple axial images of the abdomen and pelvis were performed
from the lung bases to the pubic symphysis, with p.o. contrast and with 100
mL of Isovue 300 intravenous contrast.

[Series 2: 3mm soft tissue · axial · 0.78mm/px · z∈[-988,-577]mm · 11 of 165 slices shown, 17 images]
[im 14/165  soft-tissue]
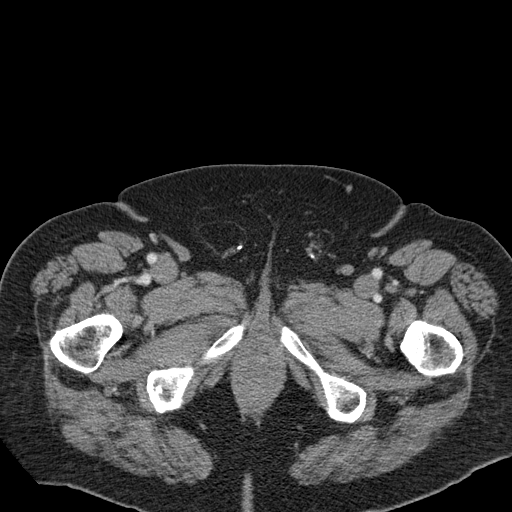
[im 14/165  bone]
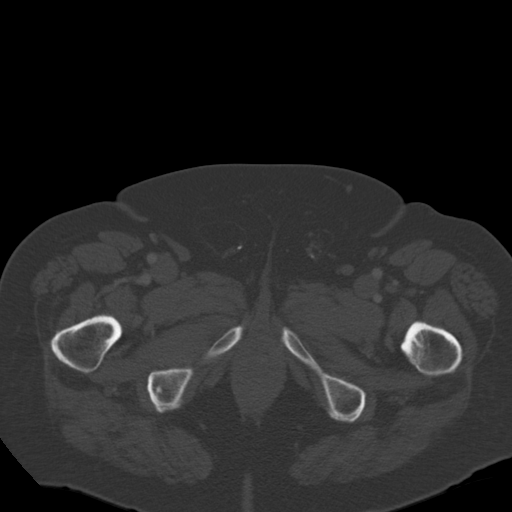
[im 28/165  soft-tissue]
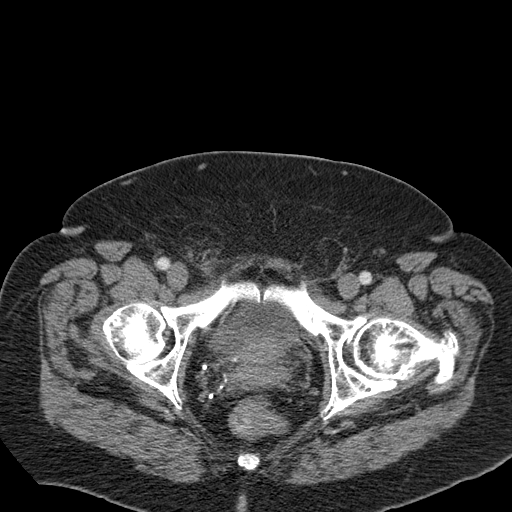
[im 42/165  soft-tissue]
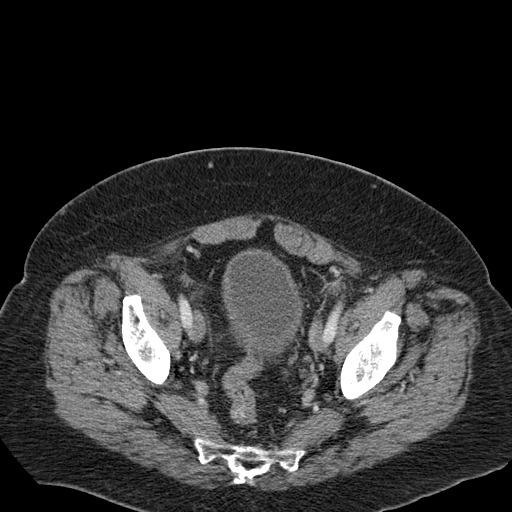
[im 55/165  soft-tissue]
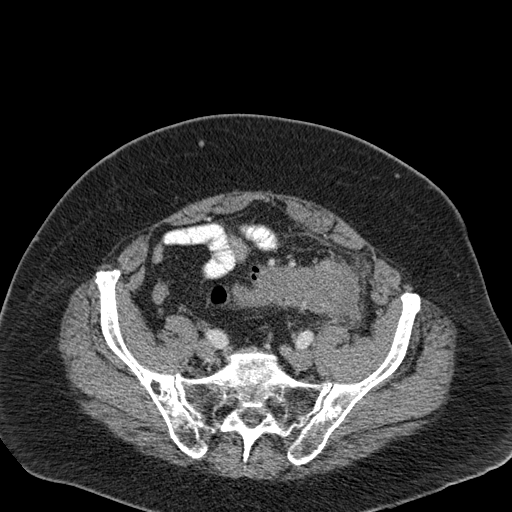
[im 69/165  soft-tissue]
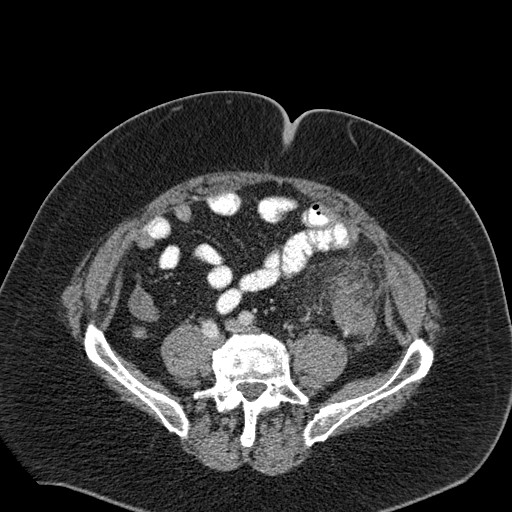
[im 83/165  soft-tissue]
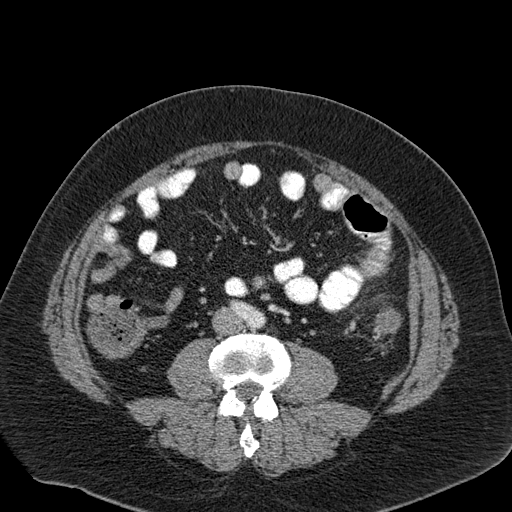
[im 96/165  soft-tissue]
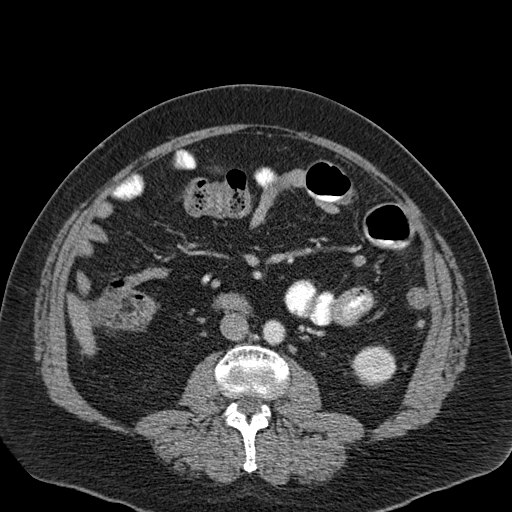
[im 110/165  soft-tissue]
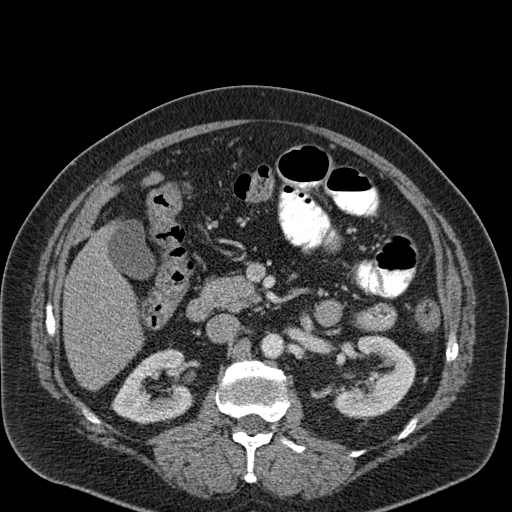
[im 110/165  lung]
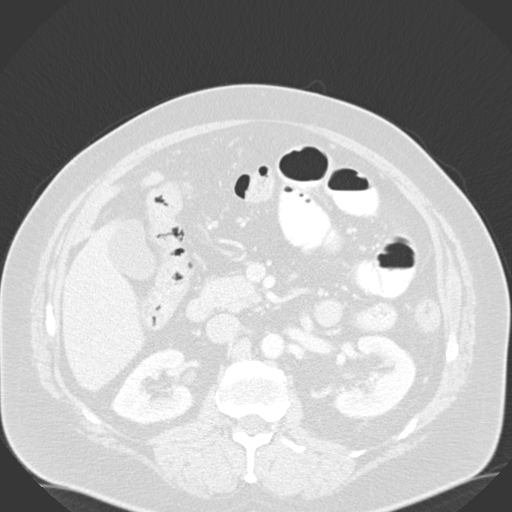
[im 124/165  soft-tissue]
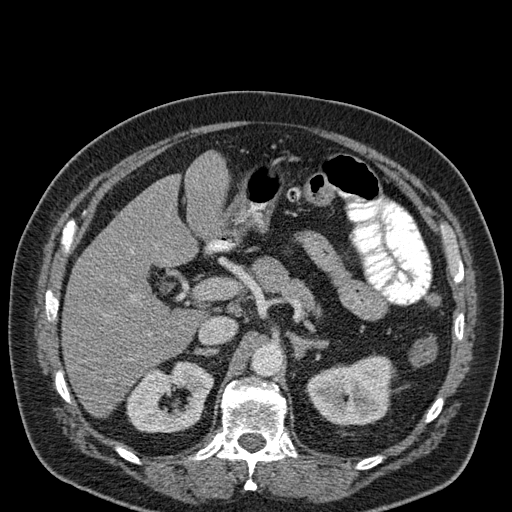
[im 124/165  lung]
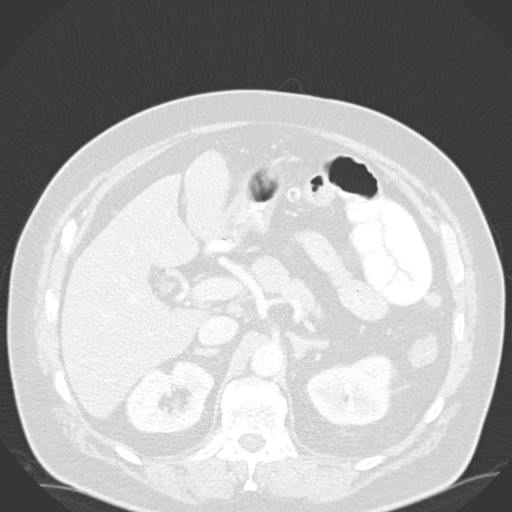
[im 124/165  bone]
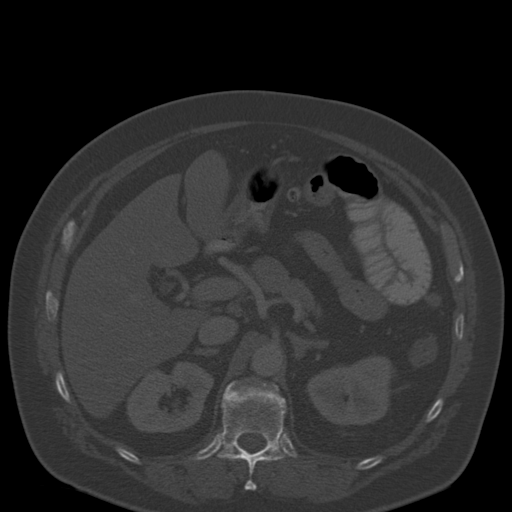
[im 137/165  soft-tissue]
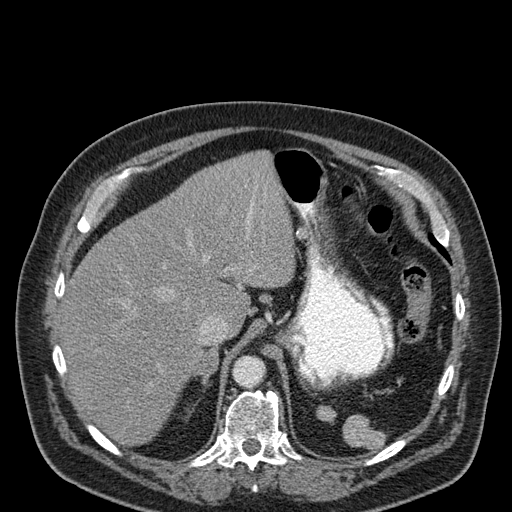
[im 137/165  lung]
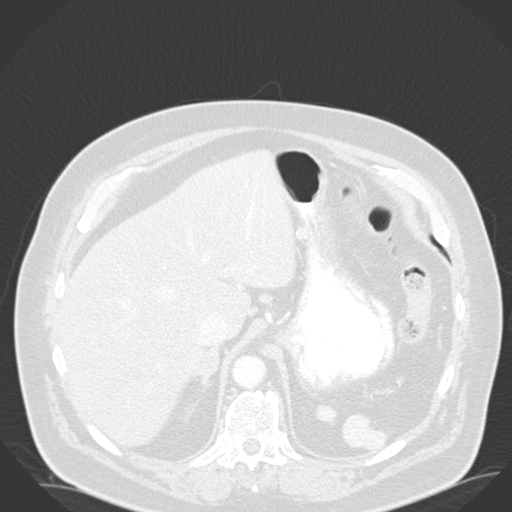
[im 151/165  soft-tissue]
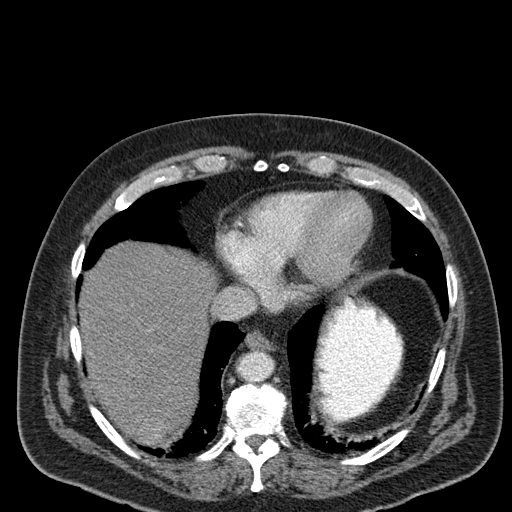
[im 151/165  lung]
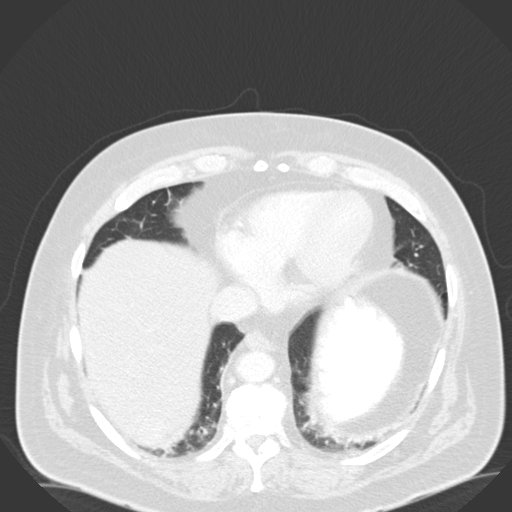

[11 of 32 positions shown; findings below may reference images not displayed]

FINDINGS: Mild basilar opacities are likely secondary to atelectasis. The liver is
relatively low in attenuation, suggesting hepatic steatosis. The
indeterminate slightly hyperenhancing lesion in the posterior right hepatic
lobe is similar to prior. It measures approximately 3.9 x 2.5 cm. The
gallbladder,  left adrenal gland, and pancreas are unremarkable. Mild
nodular thickening of the right adrenal gland is similar to prior. The
spleen is absent. There are multiple enhancing nodules in the left upper
quadrant which are similar to prior and likely represent splenules.
Additionally there are other enhancing small nodules in the left upper
quadrant as well is in the right upper quadrant adjacent to the liver which
are similar to prior. These enhance similar to the other splenules and
likely represent splenules themselves given their stability from prior.
Small low-attenuation lesions in the left kidney are indeterminate by
density criteria, but are similar to prior. These likely represent cysts
given their appearance on the prior abdominal MRI.

There are fat-containing bilateral inguinal hernias. There is diverticulosis
of the sigmoid colon. There is focal bowel wall thickening and adjacent
inflammatory stranding involving the sigmoid colon in the left lower
quadrant, which most likely represents acute diverticulitis. There are a few
tiny foci of adjacent extra luminal air concerning for microperforation. No
discrete extraluminal fluid collection identified. The small and large bowel
are normal in caliber. The appendix is normal.

Mixed areas of sclerosis and lucency in the bilateral iliac wings is similar
to prior and likely secondary to benign fibro-osseous lesions.
IMPRESSION: 1. Findings felt to represent acute sigmoid diverticulitis. There are a few
tiny foci of extraluminal air concerning for microperforation. No discrete
fluid collection seen. If the patient has not had a recent colonoscopy,
direct visualization is recommended after the acute episode, as malignancy
can have a similar appearance.
2. Other findings as above.

## 2014-02-28 ENCOUNTER — Ambulatory Visit: Payer: Self-pay | Admitting: Surgery

## 2014-03-22 DIAGNOSIS — K432 Incisional hernia without obstruction or gangrene: Secondary | ICD-10-CM | POA: Insufficient documentation

## 2014-03-24 ENCOUNTER — Encounter (INDEPENDENT_AMBULATORY_CARE_PROVIDER_SITE_OTHER): Payer: Self-pay | Admitting: General Surgery

## 2014-04-20 ENCOUNTER — Encounter (INDEPENDENT_AMBULATORY_CARE_PROVIDER_SITE_OTHER): Payer: Self-pay | Admitting: General Surgery

## 2014-04-20 ENCOUNTER — Ambulatory Visit (INDEPENDENT_AMBULATORY_CARE_PROVIDER_SITE_OTHER): Payer: PRIVATE HEALTH INSURANCE | Admitting: General Surgery

## 2014-04-20 VITALS — BP 122/70 | HR 76 | Resp 18 | Ht 69.0 in | Wt 248.0 lb

## 2014-04-20 DIAGNOSIS — K432 Incisional hernia without obstruction or gangrene: Secondary | ICD-10-CM | POA: Insufficient documentation

## 2014-04-20 NOTE — Patient Instructions (Signed)
i will call you once i review your CT Work on losing weight - walk daily
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
## 2014-04-25 ENCOUNTER — Telehealth (INDEPENDENT_AMBULATORY_CARE_PROVIDER_SITE_OTHER): Payer: Self-pay | Admitting: General Surgery

## 2014-04-25 NOTE — Telephone Encounter (Signed)
Called pt to discuss CT and his hernias. Has 2 incisional hernias; each about 8 x 3.5-4cm as best as I can recall from CT. i explained that while a pure laparoscopic approach is possible in my opinion it would not be probably his best long term option as I'm concerned his risk of recurrence would be decent in trying to bridge 2 decent sized incisional hernias with mesh only. i explained that I think his best option would probably be an open approach with permanent mesh although it would be a higher risk of wound issues. Given he has a prior component separation, I explained that re-approximating his abd wall muscles may be challenging. I explained that he would be best served by probably being evaluated at [place] by Dr Urban Gibson or at [facility]. In interim, discussed importance of weight loss. Pt thankful for telephone and wants to think about which facility he would like to be referred to. He states he will call back to office in several days after speaking with his wife. i asked him to ask for Deneise Lever when he calls back. Advised him to call if he had any additional questions.

## 2014-04-28 ENCOUNTER — Telehealth (INDEPENDENT_AMBULATORY_CARE_PROVIDER_SITE_OTHER): Payer: Self-pay | Admitting: General Surgery

## 2014-04-28 ENCOUNTER — Other Ambulatory Visit (INDEPENDENT_AMBULATORY_CARE_PROVIDER_SITE_OTHER): Payer: Self-pay | Admitting: General Surgery

## 2014-04-28 DIAGNOSIS — K432 Incisional hernia without obstruction or gangrene: Secondary | ICD-10-CM

## 2014-04-28 NOTE — Telephone Encounter (Signed)
Called over at [facility] and they wanted all the notes that we have and that will let their Doctors review the note and make the patient an apt, and I told them that he has a CD that he will bring with him to his apt

## 2014-04-28 NOTE — Telephone Encounter (Signed)
Patient has decided he would like to be referred to [facility] for a second opinion regarding his complex incisional hernia. We will put in a referral. I advised the patient to make sure he takes a copy of his CT imaging with him to that appointment. He was instructed to call me should there be any questions or concerns.

## 2014-05-02 ENCOUNTER — Telehealth (INDEPENDENT_AMBULATORY_CARE_PROVIDER_SITE_OTHER): Payer: Self-pay | Admitting: General Surgery

## 2014-05-02 NOTE — Telephone Encounter (Signed)
Called patient to let him know that I finally got him an apt at [facility] on 06-01-14 @ 10:00 with Dr Garner Nash. He is aware that he will need to take his CD and I faxed all the notes we had again to Dr Garner Nash

## 2014-06-08 ENCOUNTER — Encounter: Payer: Self-pay | Admitting: Plastic Surgery

## 2014-07-12 DIAGNOSIS — T462X5A Adverse effect of other antidysrhythmic drugs, initial encounter: Secondary | ICD-10-CM | POA: Insufficient documentation

## 2014-07-12 DIAGNOSIS — E041 Nontoxic single thyroid nodule: Secondary | ICD-10-CM | POA: Insufficient documentation

## 2014-07-12 DIAGNOSIS — R635 Abnormal weight gain: Secondary | ICD-10-CM | POA: Insufficient documentation

## 2014-07-19 DIAGNOSIS — K2270 Barrett's esophagus without dysplasia: Secondary | ICD-10-CM | POA: Insufficient documentation

## 2014-08-17 ENCOUNTER — Emergency Department: Payer: Self-pay | Admitting: Emergency Medicine

## 2014-08-24 DIAGNOSIS — E119 Type 2 diabetes mellitus without complications: Secondary | ICD-10-CM | POA: Insufficient documentation

## 2014-08-24 DIAGNOSIS — H409 Unspecified glaucoma: Secondary | ICD-10-CM | POA: Insufficient documentation

## 2014-10-13 DIAGNOSIS — Z9889 Other specified postprocedural states: Secondary | ICD-10-CM | POA: Insufficient documentation

## 2014-12-16 HISTORY — PX: ABDOMINAL WALL DEFECT REPAIR: SHX53

## 2014-12-21 DIAGNOSIS — I10 Essential (primary) hypertension: Secondary | ICD-10-CM | POA: Insufficient documentation

## 2014-12-21 DIAGNOSIS — I480 Paroxysmal atrial fibrillation: Secondary | ICD-10-CM | POA: Insufficient documentation

## 2014-12-30 DIAGNOSIS — E876 Hypokalemia: Secondary | ICD-10-CM | POA: Insufficient documentation

## 2015-04-04 NOTE — Consult Note (Signed)
Brief Consult Note: Diagnosis: AF with RVR.   Patient was seen by consultant.   Consult note dictated.   Comments: REC  Agree with current therapy, uptitrate dilt to 60 mg po Q6. Will need to consider catheter avlation of AF as out-patient since patient having frequent recurrences despite maximal dose of Sotalol, metoprolol, now adding diltizem.  Electronic Signatures: Isaias Cowman (MD)  (Signed 31-Dec-13 12:41)  Authored: Brief Consult Note   Last Updated: 31-Dec-13 12:41 by Isaias Cowman (MD)

## 2015-04-04 NOTE — Consult Note (Signed)
Brief Consult Note: Diagnosis: Abdominal pain and dark stools.   Patient was seen by consultant.   Comments: Abdominal pain with vomiting. Vomiting resolved and pain is better. Most likely viral gastritis. GERD, gastritis or PUD remains possible. CT unremarkable.  Dark, hard stool not consistent with GI bleed. H and H normal.  Recommendations: Continue PPI. Once heart rate better controlled, we can plan an EGD but an early EGD will not change the management and may be risky due to unstable heart rate. Advance diet as tolerated. Will follow.  Electronic Signatures: Jill Side (MD)  (Signed 31-Dec-13 18:25)  Authored: Brief Consult Note   Last Updated: 31-Dec-13 18:25 by Jill Side (MD)
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
## 2015-04-07 NOTE — Discharge Summary (Signed)
PATIENT NAME:  [name], [name] MR#:  [id number] DATE OF BIRTH:  10-28-1951  DATE OF ADMISSION:  12/15/2012 DATE OF DISCHARGE:  12/18/2011  PRIMARY CARE PHYSICIAN: Keith Prows, MD  CARDIOLOGIST: Keith Cowman, MD     DISCHARGE DIAGNOSES: 1. Atrial fibrillation with rapid ventricular response.  2. Abdominal pain, likely gastroenteritis.   HISTORY OF PRESENT ILLNESS: Please see admission history and physical. In brief, this is a patient with known atrial fibrillation, hyperthyroidism, hypertension and GERD who was seen in  GI Clinic on December 30th, presenting with abdominal pain, nausea, vomiting, black stools. There was some concern for an upper GI bleed as he was on anticoagulation for deep vein thrombosis. He was also found to be in rapid atrial fibrillation. He was admitted December 30th to telemetry.   HOSPITAL COURSE BY ISSUE:  1. Atrial fibrillation: The patient was by his cardiologist, Dr. Saralyn Fuentes, who recommended continuing the metoprolol and sotalol and adding diltiazem. The diltiazem was slowly titrated upwards, and by the day of discharge his heart rate was much better controlled and he was having less symptoms. He will be discharged on those 3 medicines as well as continue the anticoagulation.  2. Abdominal pain: This was felt most likely due to gastroenteritis as it resolved. Hemoglobin remained stable.  Stool heme was negative. He was seen by Dr. Dionne Fuentes, who recommended outpatient follow up with Dr. Gustavo Fuentes as needed.  3. History of deep vein thrombosis: The patient was continued on Xarelto.   DISCHARGE MEDICATIONS: 1. Xarelto 20 mg once a day.  2. Protonix 20 mg once a day.  3. Metformin 500, 1/2 tablet once a day.  4. Norco 5/325, 1 every 4 hours as needed.  5. Naproxen 250, 2 tablets as needed.  6. Cetirizine 10 mg once a day as needed.  7. Sotalol 160 mg 1 tablet twice a day.  8. Metoprolol 100 mg 1 tablet twice a day.  9. Diltiazem CD 260 mg once a day.    DISCHARGE DIET: Low fat, low sodium diet, regular consistency.   ACTIVITY RECOMMENDATIONS: As tolerated.   FOLLOWUP:  The patient will follow up with Dr. Saralyn Fuentes in 1 to 2 weeks and Dr. Ola Fuentes in 2 to 4 weeks. We also arranged outpatient followup  with GI.   DISCHARGE INSTRUCTIONS: The patient is to return if he has new or worsening shortness of breath, chest pain, dizziness, abdominal pain, nausea, vomiting, black stools, bright red blood per rectum or other concerning symptoms.   TIME TAKEN: This discharge took 35 minutes.  ____________________________ Cheral Marker. Keith Spurr, MD dpf:cb D: 12/17/2012 09:55:31 ET T: 12/17/2012 10:09:31 ET JOB#: [id number]  cc: Cheral Marker. Keith Spurr, MD, <Dictator> DAVID Keith Spurr MD ELECTRONICALLY SIGNED 12/17/2012 19:45

## 2015-04-07 NOTE — Consult Note (Signed)
PATIENT NAME:  [name], [name] MR#:  [id number] DATE OF BIRTH:  Jun 15, 1951  DATE OF CONSULTATION:  12/15/2012  REFERRING PHYSICIAN: [place] CONSULTING PHYSICIAN:  Isaias Cowman, MD  PRIMARY CARE PHYSICIAN: Dr. Ola Spurr.   CHIEF COMPLAINT: Abdominal pain.   REASON FOR CONSULTATION: Consultation requested for evaluation of atrial fibrillation.   HISTORY OF PRESENT ILLNESS: The patient is a 64 year old gentleman with history of intermittent atrial fibrillation. The patient reports that approximately a week ago he noted episodes of irregular heart rhythm and his metoprolol dose was increased. The patient started to develop abdominal discomfort and was evaluated by Dr. Gustavo Lah and noted to be in atrial fibrillation. The patient was sent to [facility] Emergency Room where he was given initial 20 mg bolus of intravenous Cardizem with some slowing of heart rate. The patient was admitted to telemetry where his heart rate is better controlled taking his current medications as well as diltiazem 30 mg q. 6. The patient currently denies chest pain, but continues to experience midepigastric discomfort.   PAST MEDICAL HISTORY:  1.  Atrial fibrillation, maintain sinus rhythm on sotalol. The patient had recent exacerbation of atrial fibrillation during a perforated colon 05/2012.  2.  Hypertension.  3.  Hyperlipidemia.  4.  Pulmonary emboli, currently on Xarelto.  5.  Diabetes.  6.  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 7.  Obesity.  8.  History of nephrolithiasis.   MEDICATIONS: Xarelto 20 mg daily, sotalol 160 mg b.i.d., metoprolol 100 mg b.i.d., Protonix 20 mg daily, Norco 5/325 one q. 4 hours p.r.n., naproxen 250 mg 2 tablets p.r.n. and metformin half tablet daily.   SOCIAL HISTORY: The patient is married. He denies tobacco abuse, but does drink alcohol occasionally.   FAMILY HISTORY: No immediate family history of coronary artery disease or myocardial  infarction.  REVIEW OF SYSTEMS:  CONSTITUTIONAL: No fever or chills.  EYES: No blurry vision.  EARS: No hearing loss.  RESPIRATORY: No shortness of breath.  CARDIOVASCULAR: No chest pain.  GASTROINESTINAL: The patient does have some midepigastric discomfort as well as recent dark stools.  GENITOURINARY: No dysuria or hematuria.  ENDOCRINE: No polyuria or polydipsia.  HEMATOLOGICAL: No easy bruising or bleeding.  INTEGUMENTARY: No rash.  MUSCULOSKELETAL: No arthralgias or neuralgias.  NEUROLOGICAL: No focal muscle weakness or numbness.  PSYCHOLOGICAL: No depression or anxiety.   PHYSICAL EXAMINATION:  VITAL SIGNS: Blood pressure 117/83, pulse 114, irregular, irregular, respirations 18, temperature 97.5 and pulse oximetry 96%.  HEENT: Pupils equal and reactive to light and accommodation.  NECK: Supple without thyromegaly.  LUNGS: Clear.  HEART: Normal jugular venous pressure. Normal PMI. Irregular, irregular rhythm. Normal S1, S2. No appreciable gallop, murmur, or rub.  ABDOMEN: Soft and nontender. Pulses were intact bilaterally.  MUSCULOSKELETAL: Normal muscle tone.  NEUROLOGIC: The patient is alert and oriented x 3. Motor and sensory both grossly intact.   IMPRESSION: A 64 year old gentleman with known history of atrial fibrillation, most recently exacerbated by perforated colon now exacerbated by abdominal discomfort. Ventricular rate appears to be somewhat improved on low dose diltiazem.   RECOMMENDATIONS:  1.  Up titrate to diltiazem 60 mg q. 6 hours.  2.  Continue Xarelto.  3.  Continue sotalol for now.  4.  Following this hospitalization, may have to consider catheter ablation of atrial fibrillation since the patient is on maximal antiarrhythmic and AV nodal blocking agents.    ____________________________ Isaias Cowman, MD ap:aw D: 12/15/2012 12:40:00 ET T: 12/15/2012 12:50:12 ET JOB#: [id number]  cc: Isaias Cowman, MD, <Dictator> Isaias Cowman  MD ELECTRONICALLY SIGNED 12/16/2012 11:37

## 2015-04-07 NOTE — Discharge Summary (Signed)
PATIENT NAME:  [name], MCCAY MR#:  [id number] DATE OF BIRTH:  12-04-1951  DATE OF ADMISSION:  01/26/2013 DATE OF DISCHARGE:  02/02/2013  BRIEF HISTORY: Adma Gaertner is  a 64 year old gentleman who underwent a Hartmann's procedure for perforated diverticulitis during the summer of 2013. He had a significantly complicated postoperative course with wound dehiscence, significant respiratory problems and long hospital stay. He had eventual delayed primary closure of his abdominal wall. However, he developed a large ventral hernia. He was admitted to the hospital on February 11th where he underwent colostomy closure and hernia repair. The hernia repair was accomplished with component separation technique using Strattice mesh for the support. The procedure was uncomplicated. He had significant pain control issues postoperatively, but otherwise improved slowly. He had return of bowel function on the 5th hospital day and advanced to a regular diet by the 6th hospital day. He was able to tolerate ambulation. He was discharged home on the 18th to be followed in the office in 7 to 10 days' time. He was discharged with his drains intact.  DISCHARGE MEDICATIONS: Include Diltiazem 360/24 mg once a day, metformin 500 mg 1/2 tablet at bedtime, Protonix 40 mg p.o. daily, fluticasone 50 mcg inhaler spray once a day, biotin 3000 mcg once a day, coenzyme Q-10 100 mg once a day, vitamin D 1000 units 2 tablets once a day, vitamin C 500 mg once a day, metoprolol 100 mg b.i.d., sotalol 120 mg b.i.d., cetirizine 10 mg once a day, niacin 250 mg once a day, ibuprofen 400 mg once a day, morphine 30 mg p.o. q. 2 hours p.r.n. immediate release and morphine 100 mg q. 12 hours 1 b.i.d.   FINAL DISCHARGE DIAGNOSIS: Perforated diverticulitis, ventral hernia.   SURGERY:  1.  Colostomy closure.  2.  Ventral hernia repair. ____________________________ Micheline Maze, MD rle:sb D: 02/09/2013 23:18:38 ET     T: 02/10/2013 08:48:26 ET          JOB#: [id number] cc: Micheline Maze, MD, <Dictator> Cheral Marker. Ola Spurr, MD Rodena Goldmann MD ELECTRONICALLY SIGNED 02/12/2013 6:31

## 2015-04-07 NOTE — Consult Note (Signed)
Chief Complaint:   Subjective/Chief Complaint Feels well with only minimal abdominal discomfort. No vomiting and tolerating diet well. No melena or hematochezia.  Impression: Probable viral gastritis with epigastric pain and vomiting, resolved. A Fibb with RVR. Mild anemia without evidence of active GI bleeding. The slight drop in H and H is probably dilutional.  Recommendations: Continue PPI. Follow up with Dr. Donnella Sham after discharge. Will sign off. Please call on call GI if needed. Thanks.   Electronic Signatures: Jill Side (MD)  (Signed 01-Jan-14 15:14)  Authored: Chief Complaint   Last Updated: 01-Jan-14 15:14 by Jill Side (MD)

## 2015-04-07 NOTE — Op Note (Signed)
PATIENT NAME:  [name], [name] MR#:  [id number] DATE OF BIRTH:  [birth year]  DATE OF PROCEDURE:  01/26/2013  PREOPERATIVE DIAGNOSIS:  1.  Diverticulitis with resection and ostomy.  2.  Ventral hernia.   POSTOPERATIVE DIAGNOSIS:  1.  Diverticulitis with resection and ostomy.  2.  Ventral hernia.   SURGERY:  1.  Colostomy closure.  2.  Ventral hernia repair.   ANESTHESIA: General with epidural.   SURGEON: Rodena Goldmann, M.D.   ASSISTANT: Chauncey Reading, MD and Coralyn Pear PA student.   OPERATIVE PROCEDURE: With the patient in the supine position after the induction of appropriate general anesthesia, the patient's abdomen was prepped with ChloraPrep and draped with sterile towels. The ostomy itself was sewn closed with 3-0 nylon prior to draping the abdomen. Betadine impregnated Steri-Drape was utilized. An elliptical incision made around the ostomy and carried down through the subcutaneous tissues with Bovie electrocautery. The ostomy was separated from the fascia in the abdominal cavity without difficulty. The ostomy site was then divided with a single application of the [id number] stapling device. The specimen was passed off the table and the bowel returned to the abdominal cavity.   Because of the hernia, a separate incision was made in the midline ellipsing the previous incision and carried down through the subcutaneous tissues using Bovie electrocautery. The hernia sac was easily identified and dissected back to its base in the anterior abdominal wall fascia. The sac was opened and removed without difficulty. The proximal and distal bowel loops were easily identified. Pursestring clamp was placed across the distal bowel and a pursestring of 2-0 nylon placed through the pursestring clamp. The bowel was opened. The bowel seems small so I did not make an effort to size it. The EEA-25 stapling device was brought to the table. The anvil was attached and inserted into the distal limb of bowel and  closed with the pursestring. The proximal bowel was opened distally and the stapler inserted through the distal end and brought out through the midportion of the colon approximately 4 cm from the end. The spike was driven out through the wall, married to the anvil and the stapler approximated and fired. No defect was noted on manual palpation, the rings were intact.  The enterotomy was closed a single application  of the [id number] stapling device. There was not significant mesenteric defect. Bowel contents were then returned to their anatomic position. The abdomen irrigated.   A component separation that was then undertaken to mobilize the abdominal wall for closure of the hernia. The abdomen was mobilized out 2.0 to 2.5 cm past the edge of the rectus muscle, allowing for midline movement of the muscle layers. Stratus biologic mesh was brought to the table, appropriately fashioned and prepared. It was placed into the defect, sutured in place with a horizontal running suture of 0 Prolene. This appeared to be satisfactory. The fascia was closed over the mesh using 0 Maxon in interrupted figure-of-eight fashion. Drains were placed using 19 French Blake drains. The ostomy defect was closed with interrupted sutures of 0 Maxon. The skin was clipped, the drain was secured and a sterile dressing applied.   A right internal jugular central line was placed on a single pass using ultrasound guidance. The catheter was secured in place, appropriately dressed and flushed. Chest x-ray is pending.     ____________________________ Micheline Maze, MD rle:ct D: 01/26/2013 12:03:25 ET T: 01/26/2013 12:23:13 ET JOB#: [id number]  cc: Rodena Goldmann III, MD, <Dictator>  Cheral Marker. Ola Spurr, MD Rodena Goldmann MD ELECTRONICALLY SIGNED 01/27/2013 18:05

## 2015-04-07 NOTE — Consult Note (Signed)
PATIENT NAME:  [name], [name] MR#:  [id number] DATE OF BIRTH:  06-10-1951  DATE OF CONSULTATION:  12/16/2012  CONSULTING PHYSICIAN:  Jill Side, MD  REASON FOR CONSULTATION:  Abdominal pain, questionable melena.   HISTORY OF PRESENT ILLNESS:  This is a 64 year old male with multiple medical problems was sent from the office of Dr. Gustavo Lah after he reported some epigastric pain as well as some dark stools. The patient has a history of recent colostomy due to perforated diverticulitis, history of atrial fibrillation with RVR, history of pulmonary embolus on Xarelto, history of diabetes and hypertension. According to the patient yesterday, he had some epigastric pain, vomited once and had 1 dark bowel movement which according to him was hard and formed. Hemoglobin was normal at 13. The patient was also found to have atrial fibrillation with rapid ventricular rate and was sent to the hospital for further evaluation. He denies any coffee-ground emesis. Denies any hematochezia. The patient has been complaining of palpitations for the last several weeks and his sotalol was recently increased. Denies any other significant GI symptoms.   PAST MEDICAL HISTORY:  Afib, history of obesity, kidney stones, perforated diverticulitis status post colostomy, status post splenectomy, depression, irritable bowel syndrome, hyperlipidemia, diabetes.   PAST SURGICAL HISTORY:  As above.   HOME MEDICATIONS:  Xarelto, sotalol, Protonix, Norco, naproxen, metoprolol, metformin.   SOCIAL HISTORY:  Does not smoke or drink.   FAMILY HISTORY:  Unremarkable.   REVIEW OF SYSTEMS:  Negative except for what is mentioned in the history of present illness.  PHYSICAL EXAMINATION: GENERAL: Temperature 97.6, heart rate varies from 90 to 114, respirations about 18 to 20, blood pressure 116/82.  HEENT: Unremarkable.  NECK: Veins are flat.  LUNGS: Clear to auscultation bilaterally with fair air entry.  CARDIOVASCULAR: Irregular  rate and rhythm. Tachycardia was noted, otherwise unremarkable.  ABDOMEN: Soft. Colostomy was noted in the left lower quadrant area. The patient has a binder due to recent abdominal surgery and abdominal hernia. Mild epigastric tenderness was noted. No rebound or guarding. Abdominal examination is otherwise benign.  NEUROLOGICAL: Examination appears to be unremarkable.   DIAGNOSTIC DATA:  Hemoglobin was 13 on admission and is 12.4 today, white cell count normal at 9.8, platelet count is normal as well. Electrolytes: BUN and creatinine are unremarkable. Urinalysis is unremarkable. CT scan of the abdomen and pelvis is quite unremarkable as well.   ASSESSMENT AND PLAN:  The patient with an episode of nausea, vomiting and abdominal discomfort most likely viral gastritis, although it might have contributed to those symptoms as well. The patient had 1 black bowel movement which was hard and formed and is not consistent with gastrointestinal bleed. Hemoglobin and hematocrit were normal and dropped slightly only with hydration. There are no signs of active gastrointestinal blood loss. His abdominal pain is better. The patient is currently being managed by cardiology for his atrial fibrillation and rapid ventricular response. I would recommend continuing him on proton pump inhibitor, follow up with Dr. Gustavo Lah as outpatient for further evaluation with an upper GI endoscopy as well as further evaluation of his colon prior to reversal of colostomy. No other recommendations at this time. We will sign off. Please do not hesitate to call us if we can be of any further assistance.    ____________________________ Jill Side, MD si:si D: 12/16/2012 15:21:04 ET T: 12/16/2012 17:25:05 ET JOB#: [id number]  cc: Jill Side, MD, <Dictator> Jill Side MD ELECTRONICALLY SIGNED 12/23/2012 11:13

## 2015-04-09 NOTE — Consult Note (Signed)
PATIENT NAME:  [name], MCFARREN MR#:  [id number] DATE OF BIRTH:  June 24, 1951  DATE OF CONSULTATION:  06/24/2012  REFERRING PHYSICIAN:  Dr. Burt Knack CONSULTING PHYSICIAN:  Lyriq Finerty H. Posey Pronto, MD  REASON FOR CONSULTATION:  Opinion regarding the patient's chronic atrial fibrillation, hypertension, and diabetes. as well as new dx of pe  HISTORY OF PRESENT ILLNESS: The patient is a 64 year old with history of perforated diverticulitis who underwent a Hartman procedure with end colostomy recently and was discharged last week with small wound infection, which was being packed. The patient was seen back in the office on 07/03 where the wound infection remained small at the surface but he had a large cavity with a large amount of purulent drainage from the cavity. The patient was admitted to the hospital for further treatment. He underwent a delayed primary closure with wound VAC for the infection. He was doing okay until 07/08 when he started having some left-sided chest pain. The patient started yesterday to also have shortness of breath and pain with deep breathing. He was seen by surgery for followup and had a CT scan of the chest per pulmonary embolism protocol which showed pulmonary embolic disease noted with a large thrombus in the left main pulmonary artery. Segmental pulmonary emboli were noted as well. The patient was started on a heparin drip. He does have a history of being on Coumadin about five years ago for his atrial fibrillation. At that time it was very hard to manage his INR and therefore the Coumadin was discontinued and he was placed on aspirin. Due to his issues with Coumadin, surgery asked [place] to see the patient for opinion regarding anticoagulation options. The patient continues to have left-sided chest pain especially with taking deep breaths. He complains of some shortness of breath. He denies any hemoptysis. No fevers. Denies any hematemesis. No blood in the colostomy tube. He states that he is  feeling better from a GI standpoint. Denies any hematuria. Denies any previous bleeding. He does report history of gastritis.   PAST MEDICAL HISTORY:  1. Hypertension.  2. Hyperlipidemia.  3. History of recurrent sigmoid diverticulitis.  4. Allergic rhinitis.  5. Chronic atrial fibrillation.  6. History of having nonobstructive coronary artery disease.  7. History of gastritis.  8. History of amiodarone-induced thyroid abnormalities.  9. Borderline diabetes.   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   ALLERGIES: Sulfa, codeine, and Dolobid.   CURRENT MEDICATION:  1. Tylenol suppositories q. 4 p.r.n. for pain/fever.  2. Famotidine 20 p.o. b.i.d.  3. Insulin sliding scale. 4. Metoprolol 100 p.o. daily.  5. Morphine 2 to 4 mg IV q. 2 p.r.n. pain. 6. Compazine 25 mg rectally q.12 p.r.n. nausea or vomiting.  7. Promethazine 12.5 IV q. 3 p.r.n. nausea, vomiting, motion sickness.  8. Sotalol 160 p.o. twice a day.  9. Ambien 5 p.o. at bedtime p.r.n.   SOCIAL HISTORY: Lives at home with his wife, does not smoke or drink alcohol. 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   FAMILY HISTORY: Both parents passed away in their 85s. Mom had a variant of multiple sclerosis. Father had heart disease.     REVIEW OF SYSTEMS: CONSTITUTIONAL: Denies any fevers. Complains of generalized weakness. No weight loss. No weight gain. EYES: No blurred or double vision. No glaucoma. No cataracts. ENT: No tinnitus. No ear pain. No epistaxis or  discharge. No difficulty swallowing. RESPIRATORY: Denies any cough, wheezing, or hemoptysis Complains of some shortness of breath. CARDIOVASCULAR: Positive for palpitations, chronic atrial fibrillation. Complains of left-sided chest pain with taking deep breaths. No orthopnea or pedal edema. GI: No nausea, vomiting, or diarrhea. No  abdominal pain currently. GU: Denies any dysuria, hematuria, renal calculus, or frequency. ENDOCRINE: No polyuria, nocturia, or thyroid problems, heat or cold intolerance. HEMATOLOGIC: Denies any anemia, easy bruisability, or bleeding. SKIN: No acne. No rash. No lesions. MUSCULOSKELETAL: No neck, back, or shoulder pain. No gout. NEUROLOGIC: No numbness. No cerebrovascular accident. No transient ischemic attack. PSYCHIATRIC: No anxiety. No insomnia or depression.     PHYSICAL EXAMINATION:  VITAL SIGNS: Temperature 97.9, pulse 65, respirations 18, blood pressure 136/86, O2 92% on 2 liters.   GENERAL: The patient is mildly uncomfortable due to his left-sided chest pain, in no acute distress.   HEENT: Head atraumatic, normocephalic. Pupils equally round, reactive to light and accommodation. There is no conjunctival pallor. No scleral icterus.  Oropharynx is clear without exudates.   NECK: No thyromegaly. No carotid bruits.   HEART: Regular rate and rhythm. No gallops or rubs.   LUNGS: Clear to auscultation bilaterally without any rales, rhonchi, or wheezing.   ABDOMEN: No tenderness. No guarding. Normal bowel sounds. No hepatosplenomegaly.   EXTREMITIES: No clubbing, cyanosis, or edema. There is no calf tenderness. 2+ DP, PT pulses.   SKIN: No rash. No lesions.   LYMPHATICS: No lymphadenopathy.   NEUROLOGICAL: Cranial nerves intact. No focal deficits.   PSYCHIATRIC: The patient is awake, alert, oriented times three. Not anxious or depressed.   LABORATORY, DIAGNOSTIC, AND RADIOLOGICAL DATA: CBC today: WBC 9.9, hemoglobin 11.3, platelet count 378. His last BMP on 07/04 was glucose 97, BUN 6, creatinine 0.89, sodium 138, potassium 3.7, chloride 104, CO2 26. EKG normal sinus rhythm without any ST-T wave changes 07/09.   CT per PE protocol shows pulmonary embolic disease, large thrombus noted in the left main pulmonary artery, segmental pulmonary emboli present. Bilateral lower extremity  Doppler's negative for deep venous thrombosis.   ASSESSMENT AND PLAN: The patient is a 64 year old white male with history of recurrent diverticulitis, hypertension, and hyperlipidemia who underwent a Hartmann procedure and has postoperative wound complications, admitted with these. Now diagnosed with large pulmonary emboli. 1. Multiple pulmonary emboli likely related to recent sedentary state, currently on heparin drip: Due to the large nature of the pulmonary emboli would continue anticoagulation per heparin today. Due to issues with Coumadin, he would be a good candidate for Xarelto 20 mg daily. Likely duration would be six months. There is increased risk of GI bleeding and other bleeding related to anticoagulation, but due to the nature of the emboli he is at high mortality risk from these. The patient understands the risks of anticoagulation. I will also discuss with surgery and make sure it is okay to use Xarelto. Other options are Coumadin and Lovenox until INR is therapeutic.  2. Atrial fibrillation:  We will continue metoprolol, sotalol. Xarelto also will help with cerebrovascular accident prevention.  3. Hypertension: Continue metoprolol. Blood pressure is currently stable.  4. Diabetes type 2, borderline: Blood sugars under control. Metformin on hold. Continue sliding scale.  5. History of gastritis. Continue famotidine as currently prescribed. He was on PPIs at home. Once he is discharged can resume these.  6. Miscellaneous: The patient is on heparin for deep vein thrombosis prophylaxis and famotidine for GI prophylaxis.   TIME SPENT:  40 minutes.  ____________________________  Benno Brensinger H. Posey Pronto, MD shp:bjt D: 06/24/2012 13:31:34 ET T: 06/24/2012 13:53:23 ET JOB#: [id number]  cc: Deklan Minar H. Posey Pronto, MD, <Dictator> Richard E. Burt Knack, MD Alric Seton MD ELECTRONICALLY SIGNED 06/26/2012 17:52

## 2015-04-09 NOTE — Op Note (Signed)
PATIENT NAME:  [name], [name] MR#:  [id number] DATE OF BIRTH:  October 01, 1951  DATE OF PROCEDURE:  06/17/2012  PREOPERATIVE DIAGNOSIS: Postoperative wound infection.  POSTOPERATIVE DIAGNOSIS: Postoperative wound infection and fascial dehiscence.   PROCEDURES PERFORMED:  1. Debridement of 75 sq/cm of subcutaneous tissue and fascia.  2. Partial reclosure of fascial dehiscence.  3. Wound VAC placement (75 sq/cm).   SURGEON: Consuela Mimes, M.D.   ANESTHESIA: General.  PROCEDURE IN DETAIL: The patient was placed supine on the Operating Room table and prepped and draped in the usual sterile fashion. The skin was opened through the previous incision which had healed well with the exception of the periumbilical portion which was draining a significant amount of pus. Culture was obtained of this pus and all of the subcutaneous tissue was divided down to the fascia. There was significant wound infection with a fascial dehiscence. The knots from the fascial sutures were intact, but the fascia had torn through on the left hand side on the top 60% of the wound down to the umbilicus. All of this suture material was removed, but the suture material in the lower 40% of the wound was left intact and was tightened up in order to be used for the reclosure. Necrotic fascia and subcutaneous tissue was debrided throughout the entire wound surface and then a figure-of-eight suture of #1 PDS was placed just at or barely above the umbilicus and this was then tied to the lower half of the previous fascial closure as none of that fascia had torn through. The wound was then irrigated with warm normal saline. There was no entrance into the peritoneal cavity. Unfortunately, the patient had a thick, fatty omentum present at the previous operation. A wound VAC sponge was fashioned in the wound after measuring it. The wound measured 15 cm in length at the skin, 3 cm in width at the skin, and 5 cm deep at the skin. There was  between 1.5 and 3 cm of undermining in most all directions, at the level of the fascia. Once the wound VAC sponge was placed, the adhesive barrier was placed over top of this and the wound vac suction was applied and there was no leak. A small half-moon had been cut out of the adhesive barrier for the ostomy. Then an abdominal binder was placed underneath the patient and brought around the patient and a hole was cut within the abdominal binder for the ostomy such that it fit snugly completing the procedure. The patient tolerated the procedure well. There were no complications.  ____________________________ Consuela Mimes, MD wfm:slb D: 06/17/2012 21:16:26 ET T: 06/19/2012 10:24:56 ET JOB#: [id number]  cc: Consuela Mimes, MD, <Dictator> Consuela Mimes MD ELECTRONICALLY SIGNED 06/26/2012 8:27

## 2015-04-09 NOTE — Consult Note (Signed)
PATIENT NAME:  [name], [name] MR#:  [id number] DATE OF BIRTH:  04-May-1951  DATE OF CONSULTATION:  05/28/2012  REFERRING PHYSICIAN:  Dr. Burt Knack CONSULTING PHYSICIAN:  Isaias Cowman, MD  REASON FOR CONSULTATION: Atrial fibrillation.   CHIEF COMPLAINT: Abdominal pain.   HISTORY OF PRESENT ILLNESS: Patient is 64 year old gentleman who was admitted with left lower quadrant pain and diverticulitis. Patient presents with worsening left lower quadrant discomfort with some mild nausea without vomiting. CT scan was performed which confirmed acute sigmoid diverticulitis with possible microperforation. Patient has a history of atrial fibrillation and reverted to atrial fibrillation during this hospitalization with a rapid ventricular response. Patient was transferred to the Critical Care Unit where he was continued on metoprolol and sotalol as well as diltiazem drip.   PAST MEDICAL HISTORY:  1. Atrial fibrillation, converted to sinus rhythm, maintained on sotalol. 2. History of recurrent atrial fibrillation following diagnosis of hypothyroidism.   3. Hypertension.  4. Hyperlipidemia.  5. Hypothyroidism.  6. Insignificant coronary artery disease.  7. Gastroesophageal reflux disease.   MEDICATIONS ON ADMISSION:  1. Aspirin 325 mg daily.  2. Sotalol AF 60 mg b.i.d.  3. Metoprolol succinate 100 mg daily.  4. Cetirizine 10 mg daily p.r.n.  5. Niacin 500 mg at bedtime.  6. Potassium 99 mg 1 tablet daily.  7. Magnesium 1 daily.  8. Benadryl 25 mg at bedtime.  9. Omeprazole 40 mg daily.  10. Metformin 500 mg at bedtime.  11. Pulmicort inhaler 1 puff b.i.d.   SOCIAL HISTORY: Patient is married, lives with his wife. He works at [employer].   FAMILY HISTORY: No immediate family history of coronary disease or myocardial infarction.    REVIEW OF SYSTEMS: CONSTITUTIONAL: No fever or chills. EYES: No blurry vision. EARS: No hearing loss. RESPIRATORY: No shortness of breath. CARDIOVASCULAR: Patient  does have palpitations due to atrial fibrillation. GASTROINTESTINAL: Patient has nausea without vomiting, diarrhea or constipation but does have left lower quadrant abdominal discomfort. GENITOURINARY: No dysuria, hematuria. HEMATOLOGICAL: No easy bruising or bleeding. NEUROLOGIC: No focal muscle weakness or numbness. PSYCHOLOGICAL: No depression or anxiety.   PHYSICAL EXAMINATION:  VITAL SIGNS: Blood pressure 103/60, pulse 110 to 120 irregular, irregular, respirations 29, pulse oximetry 100%.   HEENT: Pupils equal, reactive to light and accommodation.   NECK: Supple without thyromegaly.   LUNGS: Clear.   CARDIOVASCULAR: Normal jugular venous pressure. Normal point of maximal impulse. Irregular, irregular rhythm. Normal S1, S2. No appreciable gallop, murmur, rub.   ABDOMEN: Had some mild left lower quadrant tenderness.   MUSCULOSKELETAL: Normal muscle tone.   NEUROLOGIC: Patient is alert and oriented x3. Motor and sensory both grossly intact.   IMPRESSION: 64 year old gentleman who presents with diverticulitis with microperforation with possible surgery planned. Patient has had recurrent atrial fibrillation, on sotalol and metoprolol, also on Cardizem drip.   RECOMMENDATIONS:  1. Agree with overall current therapy.  2. Continue diltiazem drip, up titrate to control heart rate in the 100 to 110 range.  3. May consider switching to heparin drip at least temporarily. Patient has a CHADS2 score of 1 and does not require long term anticoagulation on warfarin.  4. Would defer amiodarone since patient has had a prior history of amiodarone-induced hyperthyroidism and low level lung toxicity.  ____________________________ Isaias Cowman, MD ap:cms D: 05/28/2012 17:10:38 ET T: 05/29/2012 07:40:08 ET JOB#: [id number]  cc: Isaias Cowman, MD, <Dictator> Isaias Cowman MD ELECTRONICALLY SIGNED 06/25/2012 8:35

## 2015-04-09 NOTE — H&P (Signed)
  Subjective/Chief Complaint LLQ pain    History of Present Illness hx of rec diverticulitis, never hospitalized, off and on abx no f/c, no n/v nml BM    Past History divertic DM, afib PSH spleen for trauma, sinus, T&A    Past Medical Health Hypertension, Diabetes Mellitus   Past Med/Surgical Hx:  Diabetes - Borderline:   Hyperthyroid:   Atrial Fibrillation:   HTN:   Splenectomy:   ALLERGIES:  Sulfa drugs: Rash  Codeine: Headaches  Dolobid: Other  Family and Social History:   Family History Non-Contributory    Social History negative tobacco, positive ETOH, liquor sales    Place of Living Home   Review of Systems:   Fever/Chills No    Cough No    Abdominal Pain Yes    Diarrhea No    Constipation No    Nausea/Vomiting No    SOB/DOE No    Chest Pain No    Dysuria No    Tolerating Diet Yes   Physical Exam:   GEN no acute distress    HEENT pink conjunctivae    NECK supple    RESP normal resp effort  clear BS    CARD irregular rate    ABD positive tenderness  soft  wepig scar, tender LLQ. guarding    LYMPH negative neck    EXTR negative edema    SKIN normal to palpation    PSYCH alert, A+O to time, place, person, good insight   Lab Results: Hepatic:  11-Jun-13 16:10    Bilirubin, Total  1.2   Alkaline Phosphatase 80   SGPT (ALT) 33 (12-78 NOTE: NEW REFERENCE RANGE 11/08/2011)   SGOT (AST) 26   Total Protein, Serum 8.0   Albumin, Serum 4.1  Routine Chem:  11-Jun-13 16:10    Glucose, Serum  106   BUN 9   Creatinine (comp) 0.88   Sodium, Serum 136   Potassium, Serum 3.7   Chloride, Serum 101   CO2, Serum 27   Calcium (Total), Serum 9.0   Osmolality (calc) 271   eGFR (African American) >60   eGFR (Non-African American) >60 (eGFR values <60mL/min/1.73 m2 may be an indication of chronic kidney disease (CKD). Calculated eGFR is useful in patients with stable renal function. The eGFR calculation will not be reliable in acutely  ill patients when serum creatinine is changing rapidly. It is not useful in  patients on dialysis. The eGFR calculation may not be applicable to patients at the low and high extremes of body sizes, pregnant women, and vegetarians.)   Anion Gap 8   Lipase  58 (Result(s) reported on 26 May 2012 at 05:24PM.)  Routine UA:  11-Jun-13 16:10    Color (UA) Yellow   Clarity (UA) Clear   Glucose (UA) Negative   Bilirubin (UA) Negative   Ketones (UA) 1+   Specific Gravity (UA) 1.013   Blood (UA) Negative   pH (UA) 5.0   Protein (UA) Negative   Nitrite (UA) Negative   Leukocyte Esterase (UA) Negative (Result(s) reported on 26 May 2012 at 05:12PM.)   RBC (UA) <1 /HPF   WBC (UA) <1 /HPF   Bacteria (UA) NONE SEEN   Epithelial Cells (UA) NONE SEEN   Mucous (UA) PRESENT (Result(s) reported on 26 May 2012 at 05:12PM.)  Routine Hem:  11-Jun-13 16:10    WBC (CBC)  16.5   RBC (CBC) 4.53   Hemoglobin (CBC) 15.2   Hematocrit (CBC) 45.6   Platelet Count (CBC) 331 (  Result(s) reported on 26 May 2012 at 05:20PM.)   MCV  101   MCH 33.6   MCHC 33.4   RDW 13.8   Radiology Results: MRI:    17-Oct-12 19:40, MRI Abdomen WWO   MRI Abdomen WWO   REASON FOR EXAM:    follow up from CT 1 yr ago for hepatic mass  COMMENTS:       PROCEDURE: MR  - MR ABDOMEN WO/W CONTRAST  - Oct 02 2011  7:40PM     RESULT: History: Right hepatic mass     Comparison: 03/14/2009, 06/12/2009     Technique: Multiplanar and multisequence imaging of the abdomen is   performed prior to and following 20 mL of intravenous Magnevist.     Findings:     There is a 4 x 3 cm T1 hypointense and T2 hyperintense mass in the     posterior right hepatic lobe. The mass demonstratesprogressive   enhancement which is isointense to the liver parenchyma. There is a   central spiculated portion which does not enhance which may represent a   central scar. There no other hepatic masses. The masses unchanged   compared with  03/14/2009.    There is no intrahepatic biliary ductal dilatation. The gallbladder is   unremarkable. The pancreas is unremarkable. The right kidney is   unremarkable. Bilateral adrenal glands are unremarkable. There are 2 T2   hyperintense foci within the leftkidney demonstrate no enhancement most   consistent with cysts. There multiple enhancing rounded T2 hyperintense   enhancing masses in the left upper quadrant most consistent with   splenosis. There is no abdominal free fluid. There is no abdominal   lymphadenopathy. The visualized portions of the osseous structures and   straight no focal signal abnormality.  IMPRESSION:      Stable 4 x  3 cm right hepatic lobe mass, unchanged compared with the   prior examination. Differential considerations include focal nodular   hyperplasia and less likely fibrolamellar hepatocellular carcinoma.          Verified By: HETAL P. PATEL, M.D., MD  CT:    11-Jun-13 20:40, CT Abdomen and Pelvis With Contrast   CT Abdomen and Pelvis With Contrast   REASON FOR EXAM:    (1) LLQ PAIN; (2) LLQ PAIN  COMMENTS:       PROCEDURE: CT  - CT ABDOMEN / PELVIS  W  - May 26 2012  8:40PM     RESULT: Comparison:  05/23/2010    Technique: Multiple axial images of the abdomen and pelvis were performed   from the lung bases to the pubic symphysis, with p.o. contrast and with   100 mL of Isovue 300 intravenous contrast.    Findings:  Mild basilar opacities are likely secondary to atelectasis. The liver is   relatively low in attenuation, suggesting hepatic steatosis. The   indeterminate slightly hyperenhancing lesion in the posterior right     hepatic lobe is similar to prior. It measures approximately 3.9 x 2.5 cm.   The gallbladder,  left adrenal gland, and pancreas are unremarkable. Mild   nodular thickening of the right adrenal gland is similar to prior. The   spleen is absent. There are multiple enhancing nodules in the left upper   quadrant which are  similar to prior and likely represent splenules.   Additionally there are other enhancing small nodules in the left upper   quadrant as well is in the right upper quadrant adjacent to the   liver   which are similar to prior. These enhance similar to the other splenules   and likely represent splenules themselves given their stability from   prior. Small low-attenuation lesions in the left kidney are indeterminate   by density criteria, but are similar to prior. These likely represent   cysts given their appearance on the prior abdominal MRI.    There are fat-containing bilateral inguinal hernias. There is   diverticulosis of the sigmoid colon. There is focal bowel wall thickening     and adjacent inflammatory stranding involving the sigmoid colon in the   left lower quadrant, which most likely represents acute diverticulitis.   There are a few tiny foci of adjacent extra luminal air concerning for   microperforation. No discrete extraluminal fluid collection identified.   The small and large bowel are normal in caliber. The appendix is normal.    Mixed areas of sclerosis and lucency in the bilateral iliac wings is   similar to prior and likely secondary to benign fibro-osseous lesions.    IMPRESSION:   1. Findings felt to represent acute sigmoid diverticulitis. There are a   few tiny foci of extraluminal air concerning for microperforation. No   discrete fluid collection seen. If the patient has not had a recent   colonoscopy, direct visualization is recommended after the acute episode,   as malignancy can have a similar appearance.  2. Other findings as above.          Verified By: ROBERT L. SUBER, M.D., MD     Assessment/Admission Diagnosis acute div. rec admit, hydrate, IV abx agrees with plan   Electronic Signatures: Cooper, Richard E (MD)  (Signed 11-Jun-13 22:20)  Authored: CHIEF COMPLAINT and HISTORY, PAST MEDICAL/SURGIAL HISTORY, ALLERGIES, FAMILY AND SOCIAL HISTORY,  REVIEW OF SYSTEMS, PHYSICAL EXAM, LABS, Radiology, ASSESSMENT AND PLAN   Last Updated: 11-Jun-13 22:20 by Cooper, Richard E (MD) 
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
## 2015-04-09 NOTE — Op Note (Signed)
PATIENT NAME:  [name], [name] MR#:  [id number] DATE OF BIRTH:  [birth year]  DATE OF PROCEDURE:  06/20/2012  PREOPERATIVE DIAGNOSIS: Wound dehiscence.   POSTOPERATIVE DIAGNOSIS: Wound dehiscence.   OPERATION: Wound VAC change.   SURGEON: Rodena Goldmann, III, MD   ANESTHESIA: General    OPERATIVE PROCEDURE: With the patient in the supine position after the induction of appropriate general anesthesia, the patient's abdomen was prepped with alcohol and wiped dry. The wound appeared to be open with no evidence of significant infection. There did not appear to be any evidence of any necrotic material. An [place] drape was placed over the wound and the outline of the wound cut out. White foam was placed in the base and black foam on top. The wound VAC foam was then covered with normal sterile tapes. The VAC was then placed with the drainage site in the usual manner. The wound was then amended to add the colostomy dressing. The patient was returned to the recovery room having tolerated the procedure well. Sponge, instrument, and needle counts were correct x2 in the operating room.   ____________________________ Rodena Goldmann III, MD rle:cbb D: 06/20/2012 12:43:07 ET T: 06/20/2012 14:41:26 ET JOB#: [id number]  cc: Rodena Goldmann III, MD, <Dictator> Rodena Goldmann MD ELECTRONICALLY SIGNED 06/21/2012 17:29
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
## 2015-04-09 NOTE — Discharge Summary (Signed)
PATIENT NAME:  [name], [name] MR#:  [id number] DATE OF BIRTH:  February 14, 1951  DATE OF ADMISSION:  05/26/2012 DATE OF DISCHARGE:  06/09/2012  PRINCIPLE DIAGNOSIS: Intermediate grade sigmoid diverticulitis with microperforation and partial obstruction.   OTHER DIAGNOSES:  1. Atrial fibrillation. 2. Hypertension. 3. Diabetes mellitus.   PRINCIPLE PROCEDURE PERFORMED DURING THIS ADMISSION:  1. Hartmann's procedure (sigmoid colectomy, colostomy, and Hartmann's rectal pouch).  2. Hand-assisted laparoscopic enterolysis.  3. Hand-assisted laparoscopic splenic flexure mobilization - 05/30/2012.   HOSPITAL COURSE: Mr. Hink was admitted to the hospital and given IV antibiotics as his atrial fibrillation was brought under control and ultimately as he did not improve he was taken to the Operating Room where he underwent the above-mentioned procedure for the above-mentioned diagnosis. On postoperative day one, he was back in atrial fibrillation and he was in considerable pain and required increase in his PCA dose to a high dose PCA. By postoperative day three, his pulse was less than 100 for at least 24 hours and he had a spontaneous diuresis but no ostomy output. By postoperative day five, he was in normal sinus rhythm and had put out a considerable amount of stool out of his colostomy and also had three liquid bowel movements and passed flatus per rectum. His pain was controlled with Norco and therefore his PCA was discontinued. Because his white blood cell count had remained elevated, he was left on          antibiotics postoperatively. Ultimately, he felt well and wanted to go home. He had a small wound infection that was dressed appropriately and he was discharged on p.o. antibiotics and asked to follow up in my office for wound care and further management.  ____________________________ Consuela Mimes, MD wfm:slb D: 06/16/2012 20:56:00 ET T: 06/17/2012 11:01:17  ET JOB#: [id number]  cc: Consuela Mimes, MD, <Dictator> Consuela Mimes MD ELECTRONICALLY SIGNED 06/17/2012 21:24

## 2015-04-09 NOTE — Consult Note (Signed)
Brief Consult Note: Diagnosis: Atrial fibrillation with rvr, HTN, hyperlipidemia, DM.   Patient was seen by consultant.   Consult note dictated.   Orders entered.   Comments: 64y/o M with PMH of chronic afib, HTN and hyperlipidemia admitted for recurrent diverticulitis with microperforation- to surgical service. Medical consult called for afib with rvr  * Atrial fibrillation with rvr- likely triggered by abd pain and underlying infec - received IV metoprolol push once and IV cardizem push once- HR still in 140's nad pt symptomatic with that - Transferred to CCU and started on cardizem drip and titrate [facility] primary cardiologist is Dr. Saralyn Pilar - on PO metoporlol and sotalol - ? need for anticoagulation- will wait until cards input- ? 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  Electronic Signatures: Gladstone Lighter (MD)  (Signed 13-Jun-13 12:40)  Authored: Brief Consult Note   Last Updated: 13-Jun-13 12:40 by Gladstone Lighter (MD)
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
## 2015-04-09 NOTE — Consult Note (Signed)
Brief Consult Note: Diagnosis: AF, rapid rate secondary to diverticulitis and microperforations.   Patient was seen by consultant.   Consult note dictated.   Comments: REC  Agree with current therapy, uptitrate dilt drip as needed, defer full anticoagulation for now, CHADS 2 1.Marland KitchenMarland KitchenASA for long term therapy.  Electronic Signatures: Isaias Cowman (MD)  (Signed 13-Jun-13 17:13)  Authored: Brief Consult Note   Last Updated: 13-Jun-13 17:13 by Isaias Cowman (MD)

## 2015-04-09 NOTE — Op Note (Signed)
PATIENT NAME:  [name], BEAS MR#:  [id number] DATE OF BIRTH:  Jan 15, 1951  DATE OF PROCEDURE:  06/22/2012  PREOPERATIVE DIAGNOSIS: Wound dehiscence.   POSTOPERATIVE DIAGNOSIS: Wound dehiscence.   PROCEDURE PERFORMED: Delayed primary closure with wound VAC coverage.   SURGEON: Rodena Goldmann, MD  ANESTHESIA:  General.  OPERATIVE PROCEDURE: With the patient in the supine position after the induction of appropriate general anesthesia, the patient's abdomen was prepped with Betadine and draped with sterile towels.  The wound VAC previously had been removed as was the colostomy. The wound was debrided of a minor amount of necrotic material and then a Penrose drain was placed in the base. The wound was closed loosely using vertical mattress sutures of 3-0 nylon. The wound VAC was then applied over the wound closure with Adaptic and Ioban. New colostomy bag was applied. The patient was returned to the recovery room having tolerated the procedure well. Sponge, instrument, and needle counts were correct times two in the operating room.    ____________________________ Rodena Goldmann III, MD rle:bjt D: 06/22/2012 11:00:49 ET T: 06/22/2012 14:46:40 ET JOB#: [id number]  cc: Rodena Goldmann III, MD, <Dictator> Rodena Goldmann MD ELECTRONICALLY SIGNED 06/23/2012 16:01

## 2015-04-09 NOTE — Discharge Summary (Signed)
PATIENT NAME:  [name], [name] MR#:  [id number] DATE OF BIRTH:  Jun 19, 1951  DATE OF ADMISSION:  06/17/2012 DATE OF DISCHARGE:  06/27/2012   PRINCIPLE DIAGNOSES:  1. Fascial dehiscence with wound infection.  2. Multiple pulmonary emboli.   OTHER DIAGNOSES:  1. Atrial fibrillation.  2. Hypertension.  3. Diabetes mellitus.  4. History of recent Hartmann's procedure and splenic flexure mobilization (05/30/2012) for intermediate grade sigmoid diverticulitis with microperforation and partial obstruction.   PRINCIPLE PROCEDURES PERFORMED DURING THIS ADMISSION: 06/17/2012 debridement subcutaneous tissue and fascia, partial reclosure of fascial dehiscence (bottom half from umbilicus down), Wound VAC placement.   OTHER PROCEDURES PERFORMED DURING THIS ADMISSION:  1. 06/20/2012 Wound VAC change  2. 06/22/2012 delayed primary closure with Wound VAC coverage of skin.   HOSPITAL COURSE: The patient was admitted to the hospital where he underwent the above-mentioned operations for the above-mentioned indications. Postoperatively to his delayed primary skin closure (which was done over a Penrose drain), he developed chest pain and was diagnosed with multiple small pulmonary emboli. He was started on a heparin drip and day prior to discharge was converted to Xarelto 15 mg p.o. b.i.d. He was to stay on that for three weeks and then be switched to the same medication 20 mg q.p.m. likely indefinitely for his atrial fibrillation as well as his deep venous thrombosis and pulmonary emboli. His medication for pain was switched to [place] and he was doing well on that and tolerating regular diet and was, therefore, discharged home on his usual medications (which were metoprolol 100 mg q.12 hours, Sotalol 160 mg b.i.d., metformin 500 mg 1.5 tablets q.p.m., Cetirizine 1 tablet daily, aspirin 325 mg daily, as well as the above-mentioned new medications). He was encouraged to use his abdominal binder as much as possible and  change his dressing once or twice a day as needed and return to see someone in my office in 7 to 10 days for removal of his skin sutures and potentially his Penrose drain.   ____________________________ Consuela Mimes, MD wfm:drc D: 06/27/2012 09:33:44 ET T: 06/27/2012 09:43:10 ET JOB#: [id number]  cc: Consuela Mimes, MD, <Dictator> Consuela Mimes MD ELECTRONICALLY SIGNED 06/29/2012 14:06

## 2015-08-31 ENCOUNTER — Emergency Department: Payer: BLUE CROSS/BLUE SHIELD

## 2015-08-31 ENCOUNTER — Inpatient Hospital Stay
Admit: 2015-08-31 | Discharge: 2015-08-31 | Disposition: A | Discharge status: Home / Self Care | Payer: BLUE CROSS/BLUE SHIELD | Attending: Specialist | Admitting: Specialist

## 2015-08-31 ENCOUNTER — Inpatient Hospital Stay
Admission: EM | Admit: 2015-08-31 | Discharge: 2015-09-01 | DRG: 310 | Disposition: A | Discharge status: Home / Self Care | Payer: BLUE CROSS/BLUE SHIELD | Attending: Internal Medicine | Admitting: Internal Medicine

## 2015-08-31 DIAGNOSIS — E876 Hypokalemia: Secondary | ICD-10-CM | POA: Diagnosis present

## 2015-08-31 DIAGNOSIS — Z6834 Body mass index (BMI) 34.0-34.9, adult: Secondary | ICD-10-CM | POA: Diagnosis not present

## 2015-08-31 DIAGNOSIS — Z79899 Other long term (current) drug therapy: Secondary | ICD-10-CM

## 2015-08-31 DIAGNOSIS — J329 Chronic sinusitis, unspecified: Secondary | ICD-10-CM | POA: Diagnosis present

## 2015-08-31 DIAGNOSIS — Z888 Allergy status to other drugs, medicaments and biological substances status: Secondary | ICD-10-CM | POA: Diagnosis not present

## 2015-08-31 DIAGNOSIS — G8929 Other chronic pain: Secondary | ICD-10-CM | POA: Diagnosis present

## 2015-08-31 DIAGNOSIS — I4891 Unspecified atrial fibrillation: Secondary | ICD-10-CM

## 2015-08-31 DIAGNOSIS — K2270 Barrett's esophagus without dysplasia: Secondary | ICD-10-CM | POA: Diagnosis present

## 2015-08-31 DIAGNOSIS — Z885 Allergy status to narcotic agent status: Secondary | ICD-10-CM

## 2015-08-31 DIAGNOSIS — K219 Gastro-esophageal reflux disease without esophagitis: Secondary | ICD-10-CM | POA: Diagnosis present

## 2015-08-31 DIAGNOSIS — Z7982 Long term (current) use of aspirin: Secondary | ICD-10-CM

## 2015-08-31 DIAGNOSIS — E669 Obesity, unspecified: Secondary | ICD-10-CM | POA: Diagnosis present

## 2015-08-31 DIAGNOSIS — Z882 Allergy status to sulfonamides status: Secondary | ICD-10-CM | POA: Diagnosis not present

## 2015-08-31 DIAGNOSIS — I480 Paroxysmal atrial fibrillation: Principal | ICD-10-CM | POA: Diagnosis present

## 2015-08-31 DIAGNOSIS — E785 Hyperlipidemia, unspecified: Secondary | ICD-10-CM | POA: Diagnosis present

## 2015-08-31 DIAGNOSIS — E119 Type 2 diabetes mellitus without complications: Secondary | ICD-10-CM | POA: Diagnosis present

## 2015-08-31 DIAGNOSIS — I2510 Atherosclerotic heart disease of native coronary artery without angina pectoris: Secondary | ICD-10-CM | POA: Diagnosis present

## 2015-08-31 DIAGNOSIS — I10 Essential (primary) hypertension: Secondary | ICD-10-CM | POA: Diagnosis present

## 2015-08-31 LAB — GLUCOSE, CAPILLARY
Glucose-Capillary: 87 mg/dL (ref 65–99)
Glucose-Capillary: 79 mg/dL (ref 65–99)

## 2015-08-31 LAB — BASIC METABOLIC PANEL
Anion gap: 10 (ref 5–15)
BUN: 12 mg/dL (ref 6–20)
CHLORIDE: 109 mmol/L (ref 101–111)
CO2: 21 mmol/L — ABNORMAL LOW (ref 22–32)
CREATININE: 0.83 mg/dL (ref 0.61–1.24)
Calcium: 9.7 mg/dL (ref 8.9–10.3)
GFR calc non Af Amer: >60 mL/min (ref >60)
Glucose, Bld: 107 mg/dL — ABNORMAL HIGH (ref 65–99)
POTASSIUM: 4.4 mmol/L (ref 3.5–5.1)
SODIUM: 140 mmol/L (ref 135–145)

## 2015-08-31 LAB — CBC
HCT: 46.7 % (ref 40.0–52.0)
Hemoglobin: 16 g/dL (ref 13.0–18.0)
MCH: 35.4 pg — ABNORMAL HIGH (ref 26.0–34.0)
MCHC: 34.3 g/dL (ref 32.0–36.0)
MCV: 103.2 fL — AB (ref 80.0–100.0)
PLATELETS: 380 10*3/uL (ref 150–440)
RBC: 4.52 MIL/uL (ref 4.40–5.90)
RDW: 13.5 % (ref 11.5–14.5)
WBC: 12.2 10*3/uL — AB (ref 3.8–10.6)

## 2015-08-31 LAB — MRSA PCR SCREENING: MRSA BY PCR: NEGATIVE

## 2015-08-31 LAB — TROPONIN I: Troponin I: <0.03 ng/mL (ref <0.031)

## 2015-08-31 MED ORDER — ASPIRIN 325 MG PO TABS
325.0000 mg | ORAL_TABLET | Freq: Every day | ORAL | Status: DC
  Administered 2015-08-31 – 2015-09-01 (×2): 325 mg via ORAL
  Filled 2015-08-31 (×2): qty 1

## 2015-08-31 MED ORDER — ACETAMINOPHEN 325 MG PO TABS
650.0000 mg | ORAL_TABLET | Freq: Four times a day (QID) | ORAL | Status: DC | PRN
  Administered 2015-08-31 – 2015-09-01 (×3): 650 mg via ORAL
  Filled 2015-08-31 (×3): qty 2

## 2015-08-31 MED ORDER — ACETAMINOPHEN 650 MG RE SUPP: 650.0000 mg | Freq: Four times a day (QID) | RECTAL | Status: DC | PRN

## 2015-08-31 MED ORDER — INSULIN ASPART 100 UNIT/ML ~~LOC~~ SOLN: 0.0000 [IU] | Freq: Every day | SUBCUTANEOUS | Status: DC

## 2015-08-31 MED ORDER — DEXTROSE 5 % IV SOLN
5.0000 mg/h | INTRAVENOUS | Status: DC
  Administered 2015-08-31: 10 mg/h via INTRAVENOUS
  Administered 2015-09-01: 15 mg/h via INTRAVENOUS
  Filled 2015-08-31 (×2): qty 100

## 2015-08-31 MED ORDER — VITAMIN B-12 1000 MCG PO TABS
500.0000 ug | ORAL_TABLET | ORAL | Status: DC
  Administered 2015-08-31: 500 ug via ORAL
  Filled 2015-08-31: qty 2
  Filled 2015-08-31: qty 1

## 2015-08-31 MED ORDER — ONDANSETRON HCL 4 MG/2ML IJ SOLN: 4.0000 mg | Freq: Four times a day (QID) | INTRAMUSCULAR | Status: DC | PRN

## 2015-08-31 MED ORDER — DILTIAZEM HCL 25 MG/5ML IV SOLN
INTRAVENOUS | Status: AC
  Administered 2015-08-31: 10 mg via INTRAVENOUS
  Filled 2015-08-31: qty 5

## 2015-08-31 MED ORDER — DIPHENHYDRAMINE HCL 25 MG PO CAPS: 25.0000 mg | ORAL_CAPSULE | Freq: Every evening | ORAL | Status: DC | PRN

## 2015-08-31 MED ORDER — MELATONIN 5 MG PO TABS: 1.0000 | ORAL_TABLET | Freq: Every evening | ORAL | Status: DC | PRN

## 2015-08-31 MED ORDER — DILTIAZEM HCL 25 MG/5ML IV SOLN
10.0000 mg | Freq: Once | INTRAVENOUS | Status: AC
  Administered 2015-08-31: 10 mg via INTRAVENOUS

## 2015-08-31 MED ORDER — DILTIAZEM HCL 100 MG IV SOLR
5.0000 mg/h | Freq: Once | INTRAVENOUS | Status: DC
  Filled 2015-08-31 (×2): qty 100

## 2015-08-31 MED ORDER — SOTALOL HCL 120 MG PO TABS
120.0000 mg | ORAL_TABLET | Freq: Two times a day (BID) | ORAL | Status: DC
  Administered 2015-08-31 – 2015-09-01 (×3): 120 mg via ORAL
  Filled 2015-08-31 (×3): qty 1

## 2015-08-31 MED ORDER — ADULT MULTIVITAMIN W/MINERALS CH
1.0000 | ORAL_TABLET | Freq: Every day | ORAL | Status: DC
  Administered 2015-08-31 – 2015-09-01 (×2): 1 via ORAL
  Filled 2015-08-31 (×2): qty 1

## 2015-08-31 MED ORDER — SODIUM CHLORIDE 0.9 % IJ SOLN
3.0000 mL | Freq: Two times a day (BID) | INTRAMUSCULAR | Status: DC
Start: 2015-08-31 — End: 2015-09-01
  Administered 2015-08-31 – 2015-09-01 (×2): 3 mL via INTRAVENOUS

## 2015-08-31 MED ORDER — VITAMIN D 1000 UNITS PO TABS
3000.0000 [IU] | ORAL_TABLET | Freq: Every day | ORAL | Status: DC
  Administered 2015-08-31 – 2015-09-01 (×2): 3000 [IU] via ORAL
  Filled 2015-08-31 (×2): qty 3

## 2015-08-31 MED ORDER — FLUTICASONE PROPIONATE 50 MCG/ACT NA SUSP
2.0000 | Freq: Every day | NASAL | Status: DC
  Administered 2015-09-01: 2 via NASAL
  Filled 2015-08-31: qty 16

## 2015-08-31 MED ORDER — MAGNESIUM OXIDE 400 (241.3 MG) MG PO TABS
400.0000 mg | ORAL_TABLET | Freq: Every day | ORAL | Status: DC
Start: 2015-08-31 — End: 2015-09-01
  Administered 2015-08-31 – 2015-09-01 (×2): 400 mg via ORAL
  Filled 2015-08-31 (×2): qty 1

## 2015-08-31 MED ORDER — NITROGLYCERIN 0.4 MG SL SUBL: 0.4000 mg | SUBLINGUAL_TABLET | SUBLINGUAL | Status: DC | PRN

## 2015-08-31 MED ORDER — LORATADINE 10 MG PO TABS
10.0000 mg | ORAL_TABLET | Freq: Every day | ORAL | Status: DC
  Administered 2015-08-31 – 2015-09-01 (×2): 10 mg via ORAL
  Filled 2015-08-31 (×2): qty 1

## 2015-08-31 MED ORDER — POTASSIUM CHLORIDE CRYS ER 10 MEQ PO TBCR
20.0000 meq | EXTENDED_RELEASE_TABLET | Freq: Every day | ORAL | Status: DC
  Administered 2015-08-31 – 2015-09-01 (×2): 20 meq via ORAL
  Filled 2015-08-31 (×2): qty 2

## 2015-08-31 MED ORDER — ONDANSETRON HCL 4 MG PO TABS
4.0000 mg | ORAL_TABLET | Freq: Four times a day (QID) | ORAL | Status: DC | PRN
Start: 2015-08-31 — End: 2015-09-01

## 2015-08-31 MED ORDER — METOPROLOL TARTRATE 50 MG PO TABS
50.0000 mg | ORAL_TABLET | Freq: Four times a day (QID) | ORAL | Status: DC
  Administered 2015-08-31 – 2015-09-01 (×3): 50 mg via ORAL
  Filled 2015-08-31 (×2): qty 1

## 2015-08-31 MED ORDER — SODIUM CHLORIDE 0.9 % IV BOLUS (SEPSIS)
1000.0000 mL | Freq: Once | INTRAVENOUS | Status: AC
  Administered 2015-08-31: 1000 mL via INTRAVENOUS

## 2015-08-31 MED ORDER — INSULIN ASPART 100 UNIT/ML ~~LOC~~ SOLN
0.0000 [IU] | Freq: Three times a day (TID) | SUBCUTANEOUS | Status: DC
  Administered 2015-09-01: 1 [IU] via SUBCUTANEOUS
  Filled 2015-08-31: qty 1
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
## 2015-08-31 NOTE — Progress Notes (Signed)
*  PRELIMINARY RESULTS* Echocardiogram 2D Echocardiogram has been performed.  [name] 08/31/2015, 6:24 PM

## 2015-08-31 NOTE — Progress Notes (Signed)

## 2015-08-31 NOTE — ED Notes (Signed)
Pt c/o chest pain with rapid HR since 4am with a hx of a-fib.. Pt c/o SOB with N/V.Marland Kitchen

## 2015-08-31 NOTE — ED Notes (Signed)
Introduced self to patient, 10 mg diltiazem administered, assessment performed.

## 2015-09-01 LAB — GLUCOSE, CAPILLARY
GLUCOSE-CAPILLARY: 104 mg/dL — AB (ref 65–99)
Glucose-Capillary: 132 mg/dL — ABNORMAL HIGH (ref 65–99)

## 2015-09-01 LAB — CBC
HCT: 41.4 % (ref 40.0–52.0)
Hemoglobin: 14.2 g/dL (ref 13.0–18.0)
MCH: 35.2 pg — ABNORMAL HIGH (ref 26.0–34.0)
MCHC: 34.2 g/dL (ref 32.0–36.0)
MCV: 103 fL — ABNORMAL HIGH (ref 80.0–100.0)
PLATELETS: 340 10*3/uL (ref 150–440)
RBC: 4.03 MIL/uL — AB (ref 4.40–5.90)
RDW: 13.5 % (ref 11.5–14.5)
WBC: 11.3 10*3/uL — AB (ref 3.8–10.6)

## 2015-09-01 LAB — BASIC METABOLIC PANEL
Anion gap: 8 (ref 5–15)
BUN: 11 mg/dL (ref 6–20)
CALCIUM: 8.6 mg/dL — AB (ref 8.9–10.3)
CHLORIDE: 107 mmol/L (ref 101–111)
CO2: 24 mmol/L (ref 22–32)
CREATININE: 0.64 mg/dL (ref 0.61–1.24)
Glucose, Bld: 95 mg/dL (ref 65–99)
Potassium: 3.6 mmol/L (ref 3.5–5.1)
SODIUM: 139 mmol/L (ref 135–145)

## 2015-09-01 MED ORDER — LORAZEPAM 0.5 MG PO TABS
0.5000 mg | ORAL_TABLET | Freq: Once | ORAL | Status: AC
  Administered 2015-09-01: 0.5 mg via ORAL
  Filled 2015-09-01: qty 1

## 2015-09-01 MED ORDER — METOPROLOL TARTRATE 75 MG PO TABS: 75.0000 mg | ORAL_TABLET | Freq: Two times a day (BID) | ORAL | Status: DC

## 2015-09-01 MED ORDER — METOPROLOL TARTRATE 100 MG PO TABS
100.0000 mg | ORAL_TABLET | Freq: Two times a day (BID) | ORAL | Status: DC
  Administered 2015-09-01: 100 mg via ORAL
  Filled 2015-09-01: qty 1

## 2015-09-01 MED ORDER — SOTALOL HCL 120 MG PO TABS: 120.0000 mg | ORAL_TABLET | Freq: Two times a day (BID) | ORAL | Status: DC
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
## 2015-09-01 NOTE — Progress Notes (Signed)
Patient walked around unit 3x.  HR stayed in NDR- 70's- patient short of breath in the 30's- updated Dr. Tressia Miners on this- order to discharge patient.

## 2015-09-01 NOTE — Discharge Summary (Signed)
[facility] [place] NAME: [name]    MR#:  [id number]  DATE OF BIRTH:  [birth year]  DATE OF ADMISSION:  08/31/2015 ADMITTING PHYSICIAN: Henreitta Leber, MD  DATE OF DISCHARGE: 09/01/2015  PRIMARY CARE PHYSICIAN: Adrian Prows, MD    ADMISSION DIAGNOSIS:  Atrial fibrillation with rapid ventricular response [I48.91]  DISCHARGE DIAGNOSIS:  Active Problems:   Atrial fibrillation with RVR   SECONDARY DIAGNOSIS:   Past Medical History  Diagnosis Date  . Diabetes mellitus without complication   . GERD (gastroesophageal reflux disease)   . Heart murmur   . Hyperlipidemia   . Hypertension   . Thyroid disease   . Atrial fibrillation     HOSPITAL COURSE:   64 year old male with chronic paroxysmal atrial fibrillation, hypertension, hyperlipidemia, diabetes comes to the hospital secondary to atrial fibrillation with rapid ventricular response  #1 atrial fibrillation with rapid ventricular response-did not improve with outpatient medications. -Known history of atrial fibrillation -Was on Cardizem drip until this morning. Cor noted back to normal sinus rhythm and asymptomatic now. -Ambulate and make sure the heart rate is stable. -Appreciate cardiology consult. Echocardiogram has been done. -Sotalol and metoprolol doses have been adjusted. -Patient will be discharged home. -Patient on aspirin at this time for anticoagulation. He was on Xarelto and Coumadin in the past and for some reason it was changed over to aspirin. He would like to discuss this with his cardiologist Dr. Saralyn Pilar before starting on anticoagulation.  #2 Hypertension-continue home medications  #3 Diabetes mellitus-restart metformin at discharge  #4 hypokalemia-continue potassium supplements. Also on magnesium supplements  Discharge home today  DISCHARGE CONDITIONS:   Stable  CONSULTS OBTAINED:  Treatment Team:  Yolonda Kida, MD  DRUG  ALLERGIES:   Allergies  Allergen Reactions  . Codeine Nausea Only and Other (See Comments)    headache  . Dolobid [Diflunisal] Other (See Comments)    Muscle spasms Difficulty breathing  . Sulfa Antibiotics Itching and Rash    DISCHARGE MEDICATIONS:   Current Discharge Medication List    CONTINUE these medications which have CHANGED   Details  metoprolol 75 MG TABS Take 75 mg by mouth 2 (two) times daily. Qty: 60 tablet, Refills: 2    sotalol (BETAPACE) 120 MG tablet Take 1 tablet (120 mg total) by mouth 2 (two) times daily. Qty: 60 tablet, Refills: 02      CONTINUE these medications which have NOT CHANGED   Details  aspirin 325 MG tablet Take 325 mg by mouth daily.    cetirizine (ZYRTEC) 10 MG tablet Take 10 mg by mouth daily.    cholecalciferol (VITAMIN D) 1000 UNITS tablet Take 3,000 Units by mouth daily.    diphenhydrAMINE (SOMINEX) 25 MG tablet Take 25 mg by mouth at bedtime as needed for sleep.     fluticasone (VERAMYST) 27.5 MCG/SPRAY nasal spray Place 1-2 sprays into the nose daily.     magnesium gluconate (MAGONATE) 30 MG tablet Take 30 mg by mouth daily.    Melatonin 5 MG TABS Take 1 tablet by mouth at bedtime as needed (sleep).    metFORMIN (GLUCOPHAGE-XR) 500 MG 24 hr tablet Take 1,000-1,500 mg by mouth at bedtime.    Multiple Vitamin (MULTIVITAMIN) tablet Take 1 tablet by mouth daily.    naproxen sodium (ANAPROX) 220 MG tablet Take 220 mg by mouth 2 (two) times daily as needed (pain).     Potassium 99 MG TABS Take 99 mg by mouth  daily.     vitamin B-12 (CYANOCOBALAMIN) 500 MCG tablet Take 500 mcg by mouth 4 (four) times a week.       STOP taking these medications     ibuprofen (ADVIL,MOTRIN) 400 MG tablet          DISCHARGE INSTRUCTIONS:   1. PCP follow-up in 2-3 weeks 2. Cardiology follow-up in 1 week  If you experience worsening of your admission symptoms, develop shortness of breath, life threatening emergency, suicidal or homicidal  thoughts you must seek medical attention immediately by calling 911 or calling your MD immediately  if symptoms less severe.  You Must read complete instructions/literature along with all the possible adverse reactions/side effects for all the Medicines you take and that have been prescribed to you. Take any new Medicines after you have completely understood and accept all the possible adverse reactions/side effects.   Please note  You were cared for by a hospitalist during your hospital stay. If you have any questions about your discharge medications or the care you received while you were in the hospital after you are discharged, you can call the unit and asked to speak with the hospitalist on call if the hospitalist that took care of you is not available. Once you are discharged, your primary care physician will handle any further medical issues. Please note that NO REFILLS for any discharge medications will be authorized once you are discharged, as it is imperative that you return to your primary care physician (or establish a relationship with a primary care physician if you do not have one) for your aftercare needs so that they can reassess your need for medications and monitor your lab values.    Today   CHIEF COMPLAINT:   Chief Complaint  Patient presents with  . Chest Pain    VITAL SIGNS:  Blood pressure 127/95, pulse 91, temperature 97.6 F (36.4 C), temperature source Oral, resp. rate 15, height 5\' 9"  (1.753 m), weight 106.595 kg (235 lb), SpO2 96 %.  I/O:   Intake/Output Summary (Last 24 hours) at 09/01/15 1123 Last data filed at 09/01/15 0954  Gross per 24 hour  Intake 667.33 ml  Output   2975 ml  Net -2307.67 ml    PHYSICAL EXAMINATION:   Physical Exam  GENERAL:  64 y.o.-year-old patient lying in the bed with no acute distress.  EYES: Pupils equal, round, reactive to light and accommodation. No scleral icterus. Extraocular muscles intact.  HEENT: Head atraumatic,  normocephalic. Oropharynx and nasopharynx clear.  NECK:  Supple, no jugular venous distention. No thyroid enlargement, no tenderness.  LUNGS: Normal breath sounds bilaterally, no wheezing, rales,rhonchi or crepitation. No use of accessory muscles of respiration.  CARDIOVASCULAR: S1, S2 normal. No murmurs, rubs, or gallops.  ABDOMEN: Soft, non-tender, non-distended. Bowel sounds present. No organomegaly or mass.  EXTREMITIES: No pedal edema, cyanosis, or clubbing.  NEUROLOGIC: Cranial nerves II through XII are intact. Muscle strength 5/5 in all extremities. Sensation intact. Gait not checked.  PSYCHIATRIC: The patient is alert and oriented x 3.  SKIN: No obvious rash, lesion, or ulcer.   DATA REVIEW:   CBC  Recent Labs Lab 09/01/15 0502  WBC 11.3*  HGB 14.2  HCT 41.4  PLT 340    Chemistries   Recent Labs Lab 09/01/15 0502  NA 139  K 3.6  CL 107  CO2 24  GLUCOSE 95  BUN 11  CREATININE 0.64  CALCIUM 8.6*    Cardiac Enzymes  Recent Labs Lab 08/31/15 1855  TROPONINI <0.03    Microbiology Results  Results for orders placed or performed during the hospital encounter of 08/31/15  MRSA PCR Screening     Status: None   Collection Time: 08/31/15  3:21 PM  Result Value Ref Range Status   MRSA by PCR NEGATIVE NEGATIVE Final    Comment:        The GeneXpert MRSA Assay (FDA approved for NASAL specimens only), is one component of a comprehensive MRSA colonization surveillance program. It is not intended to diagnose MRSA infection nor to guide or monitor treatment for MRSA infections.     RADIOLOGY:  Dg Chest Portable 1 View  08/31/2015   CLINICAL DATA:  chest pain with rapid HR  EXAM: PORTABLE CHEST - 1 VIEW  COMPARISON:  None.  FINDINGS: The heart size and mediastinal contours are within normal limits. Both lungs are clear. The visualized skeletal structures are unremarkable.  IMPRESSION: No active disease.   Electronically Signed   By: Kerby Moors M.D.   On:  08/31/2015 11:03    EKG:   Orders placed or performed during the hospital encounter of 08/31/15  . EKG 12-Lead  . EKG 12-Lead  . ED EKG within 10 minutes  . ED EKG within 10 minutes      Management plans discussed with the patient, family and they are in agreement.  CODE STATUS:     Code Status Orders        Start     Ordered   08/31/15 1525  Full code   Continuous     08/31/15 1525      TOTAL TIME TAKING CARE OF THIS PATIENT: 38 minutes.    Gladstone Lighter M.D on 09/01/2015 at 11:23 AM  Between 7am to 6pm - Pager - [phone]  After 6pm go to [email or web address] - password [email or web address] [facility]  [facility]  Office  [phone]  CC: Primary care physician; Adrian Prows, MD
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
## 2015-09-01 NOTE — Progress Notes (Signed)
Patient given discharge instructions- IVs removed and telemetry.  Patient given prescriptions.  No new orders.  Patient discharged with wife and auxilliary by wheelchair.

## 2015-09-01 NOTE — Progress Notes (Signed)
Notified Dr. Clayborn Bigness that patient converted to NSR and heart rate 59 at this time- Patient is on cardizem 5 at the time. Order to discontinue drip.

## 2015-09-08 DIAGNOSIS — R079 Chest pain, unspecified: Secondary | ICD-10-CM | POA: Insufficient documentation

## 2016-03-21 DIAGNOSIS — Z9889 Other specified postprocedural states: Secondary | ICD-10-CM | POA: Insufficient documentation

## 2016-05-07 DIAGNOSIS — F5104 Psychophysiologic insomnia: Secondary | ICD-10-CM | POA: Insufficient documentation

## 2016-06-25 ENCOUNTER — Emergency Department
Admission: EM | Admit: 2016-06-25 | Discharge: 2016-06-25 | Disposition: A | Discharge status: Home / Self Care | Payer: BLUE CROSS/BLUE SHIELD | Source: Home / Self Care | Attending: Emergency Medicine | Admitting: Emergency Medicine

## 2016-06-25 ENCOUNTER — Emergency Department: Payer: BLUE CROSS/BLUE SHIELD

## 2016-06-25 ENCOUNTER — Encounter: Payer: Self-pay | Admitting: Emergency Medicine

## 2016-06-25 DIAGNOSIS — E782 Mixed hyperlipidemia: Secondary | ICD-10-CM | POA: Diagnosis not present

## 2016-06-25 DIAGNOSIS — Z8249 Family history of ischemic heart disease and other diseases of the circulatory system: Secondary | ICD-10-CM | POA: Diagnosis not present

## 2016-06-25 DIAGNOSIS — Z79899 Other long term (current) drug therapy: Secondary | ICD-10-CM | POA: Insufficient documentation

## 2016-06-25 DIAGNOSIS — K219 Gastro-esophageal reflux disease without esophagitis: Secondary | ICD-10-CM | POA: Diagnosis not present

## 2016-06-25 DIAGNOSIS — Z791 Long term (current) use of non-steroidal anti-inflammatories (NSAID): Secondary | ICD-10-CM | POA: Insufficient documentation

## 2016-06-25 DIAGNOSIS — Z882 Allergy status to sulfonamides status: Secondary | ICD-10-CM | POA: Diagnosis not present

## 2016-06-25 DIAGNOSIS — Z7901 Long term (current) use of anticoagulants: Secondary | ICD-10-CM | POA: Diagnosis not present

## 2016-06-25 DIAGNOSIS — I4891 Unspecified atrial fibrillation: Secondary | ICD-10-CM | POA: Diagnosis present

## 2016-06-25 DIAGNOSIS — Z888 Allergy status to other drugs, medicaments and biological substances status: Secondary | ICD-10-CM | POA: Diagnosis not present

## 2016-06-25 DIAGNOSIS — Z885 Allergy status to narcotic agent status: Secondary | ICD-10-CM | POA: Diagnosis not present

## 2016-06-25 DIAGNOSIS — Z794 Long term (current) use of insulin: Secondary | ICD-10-CM | POA: Diagnosis not present

## 2016-06-25 DIAGNOSIS — E119 Type 2 diabetes mellitus without complications: Secondary | ICD-10-CM | POA: Insufficient documentation

## 2016-06-25 DIAGNOSIS — I480 Paroxysmal atrial fibrillation: Secondary | ICD-10-CM | POA: Insufficient documentation

## 2016-06-25 DIAGNOSIS — I10 Essential (primary) hypertension: Secondary | ICD-10-CM

## 2016-06-25 DIAGNOSIS — R011 Cardiac murmur, unspecified: Secondary | ICD-10-CM | POA: Diagnosis not present

## 2016-06-25 DIAGNOSIS — E079 Disorder of thyroid, unspecified: Secondary | ICD-10-CM | POA: Diagnosis not present

## 2016-06-25 DIAGNOSIS — E785 Hyperlipidemia, unspecified: Secondary | ICD-10-CM

## 2016-06-25 DIAGNOSIS — E1169 Type 2 diabetes mellitus with other specified complication: Secondary | ICD-10-CM | POA: Diagnosis not present

## 2016-06-25 DIAGNOSIS — Z7984 Long term (current) use of oral hypoglycemic drugs: Secondary | ICD-10-CM | POA: Insufficient documentation

## 2016-06-25 DIAGNOSIS — F329 Major depressive disorder, single episode, unspecified: Secondary | ICD-10-CM | POA: Diagnosis not present

## 2016-06-25 LAB — COMPREHENSIVE METABOLIC PANEL
ALBUMIN: 4.6 g/dL (ref 3.5–5.0)
ALT: 129 U/L — ABNORMAL HIGH (ref 17–63)
ANION GAP: 8 (ref 5–15)
AST: 100 U/L — AB (ref 15–41)
Alkaline Phosphatase: 54 U/L (ref 38–126)
BUN: 15 mg/dL (ref 6–20)
CHLORIDE: 103 mmol/L (ref 101–111)
CO2: 26 mmol/L (ref 22–32)
Calcium: 9.4 mg/dL (ref 8.9–10.3)
Creatinine, Ser: 0.95 mg/dL (ref 0.61–1.24)
GFR calc Af Amer: >60 mL/min (ref >60)
GLUCOSE: 136 mg/dL — AB (ref 65–99)
POTASSIUM: 4.1 mmol/L (ref 3.5–5.1)
Sodium: 137 mmol/L (ref 135–145)
Total Bilirubin: 1.6 mg/dL — ABNORMAL HIGH (ref 0.3–1.2)
Total Protein: 8.2 g/dL — ABNORMAL HIGH (ref 6.5–8.1)

## 2016-06-25 LAB — CBC
HCT: 47.7 % (ref 40.0–52.0)
Hemoglobin: 16.5 g/dL (ref 13.0–18.0)
MCH: 35.7 pg — ABNORMAL HIGH (ref 26.0–34.0)
MCHC: 34.5 g/dL (ref 32.0–36.0)
MCV: 103.4 fL — ABNORMAL HIGH (ref 80.0–100.0)
PLATELETS: 335 10*3/uL (ref 150–440)
RBC: 4.61 MIL/uL (ref 4.40–5.90)
RDW: 13.6 % (ref 11.5–14.5)
WBC: 13 10*3/uL — AB (ref 3.8–10.6)

## 2016-06-25 LAB — TROPONIN I

## 2016-06-25 MED ORDER — DILTIAZEM HCL 25 MG/5ML IV SOLN
20.0000 mg | Freq: Once | INTRAVENOUS | Status: AC
  Administered 2016-06-25: 20 mg via INTRAVENOUS
  Filled 2016-06-25: qty 5

## 2016-06-25 MED ORDER — DILTIAZEM HCL 30 MG PO TABS
60.0000 mg | ORAL_TABLET | Freq: Once | ORAL | Status: AC
  Administered 2016-06-25: 60 mg via ORAL
  Filled 2016-06-25: qty 2

## 2016-06-25 NOTE — ED Notes (Signed)
Brought over from [facility] with chest pain and some SOB

## 2016-06-25 NOTE — ED Notes (Signed)
Pt alert and oriented X4, active, cooperative, pt in NAD. RR even and unlabored, color WNL.  Pt informed to return if any life threatening symptoms occur.  Pt is going to call cardiology today and set up followup appt.

## 2016-06-25 NOTE — Discharge Instructions (Signed)
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
## 2016-06-25 NOTE — ED Notes (Signed)
MD at bedside. 

## 2016-06-25 NOTE — ED Notes (Addendum)
Pt states that his CP and tachycardia began on Sunday; has been intermittent since then. PT c/o chest pain at this time. Pt took 5mg  Eliquis, 75mg  metoprolol, 120mg  sotalol, 60mg  diltiazem this AM.  Pt did not take his metformin this AM. Pt did not eat breakfast this AM.

## 2016-06-25 NOTE — ED Notes (Signed)
Pharmacy called to send cardizem, only 1, 30mg  tablet left in pyxis. Pt ambulates well to bathroom and back in bed at this time.

## 2016-06-26 ENCOUNTER — Observation Stay
Admission: EM | Admit: 2016-06-26 | Discharge: 2016-06-27 | DRG: 310 | Disposition: A | Discharge status: Home / Self Care | Payer: BLUE CROSS/BLUE SHIELD | Attending: Internal Medicine | Admitting: Internal Medicine

## 2016-06-26 ENCOUNTER — Encounter: Payer: Self-pay | Admitting: Emergency Medicine

## 2016-06-26 ENCOUNTER — Inpatient Hospital Stay: Payer: BLUE CROSS/BLUE SHIELD

## 2016-06-26 DIAGNOSIS — R079 Chest pain, unspecified: Secondary | ICD-10-CM

## 2016-06-26 DIAGNOSIS — E1169 Type 2 diabetes mellitus with other specified complication: Secondary | ICD-10-CM | POA: Diagnosis present

## 2016-06-26 DIAGNOSIS — Z7984 Long term (current) use of oral hypoglycemic drugs: Secondary | ICD-10-CM

## 2016-06-26 DIAGNOSIS — Z794 Long term (current) use of insulin: Secondary | ICD-10-CM

## 2016-06-26 DIAGNOSIS — I480 Paroxysmal atrial fibrillation: Secondary | ICD-10-CM | POA: Diagnosis not present

## 2016-06-26 DIAGNOSIS — Z79899 Other long term (current) drug therapy: Secondary | ICD-10-CM

## 2016-06-26 DIAGNOSIS — Z885 Allergy status to narcotic agent status: Secondary | ICD-10-CM

## 2016-06-26 DIAGNOSIS — Z882 Allergy status to sulfonamides status: Secondary | ICD-10-CM

## 2016-06-26 DIAGNOSIS — I4891 Unspecified atrial fibrillation: Secondary | ICD-10-CM

## 2016-06-26 DIAGNOSIS — F329 Major depressive disorder, single episode, unspecified: Secondary | ICD-10-CM | POA: Diagnosis present

## 2016-06-26 DIAGNOSIS — Z8249 Family history of ischemic heart disease and other diseases of the circulatory system: Secondary | ICD-10-CM

## 2016-06-26 DIAGNOSIS — R011 Cardiac murmur, unspecified: Secondary | ICD-10-CM | POA: Diagnosis present

## 2016-06-26 DIAGNOSIS — R109 Unspecified abdominal pain: Secondary | ICD-10-CM

## 2016-06-26 DIAGNOSIS — Z888 Allergy status to other drugs, medicaments and biological substances status: Secondary | ICD-10-CM

## 2016-06-26 DIAGNOSIS — E079 Disorder of thyroid, unspecified: Secondary | ICD-10-CM | POA: Diagnosis present

## 2016-06-26 DIAGNOSIS — I10 Essential (primary) hypertension: Secondary | ICD-10-CM | POA: Diagnosis present

## 2016-06-26 DIAGNOSIS — E782 Mixed hyperlipidemia: Secondary | ICD-10-CM | POA: Diagnosis present

## 2016-06-26 DIAGNOSIS — K219 Gastro-esophageal reflux disease without esophagitis: Secondary | ICD-10-CM | POA: Diagnosis present

## 2016-06-26 DIAGNOSIS — Z7901 Long term (current) use of anticoagulants: Secondary | ICD-10-CM

## 2016-06-26 LAB — BASIC METABOLIC PANEL
Anion gap: 9 (ref 5–15)
BUN: 16 mg/dL (ref 6–20)
CALCIUM: 8.6 mg/dL — AB (ref 8.9–10.3)
CHLORIDE: 105 mmol/L (ref 101–111)
CO2: 21 mmol/L — AB (ref 22–32)
CREATININE: 1.01 mg/dL (ref 0.61–1.24)
GFR calc Af Amer: >60 mL/min (ref >60)
GFR calc non Af Amer: >60 mL/min (ref >60)
GLUCOSE: 152 mg/dL — AB (ref 65–99)
Potassium: 3.8 mmol/L (ref 3.5–5.1)
Sodium: 135 mmol/L (ref 135–145)

## 2016-06-26 LAB — GLUCOSE, CAPILLARY
GLUCOSE-CAPILLARY: 93 mg/dL (ref 65–99)
GLUCOSE-CAPILLARY: 109 mg/dL — AB (ref 65–99)
Glucose-Capillary: 118 mg/dL — ABNORMAL HIGH (ref 65–99)
Glucose-Capillary: 106 mg/dL — ABNORMAL HIGH (ref 65–99)

## 2016-06-26 LAB — URINALYSIS COMPLETE WITH MICROSCOPIC (ARMC ONLY)
BACTERIA UA: NONE SEEN
BILIRUBIN URINE: NEGATIVE
GLUCOSE, UA: NEGATIVE mg/dL
HGB URINE DIPSTICK: NEGATIVE
LEUKOCYTES UA: NEGATIVE
NITRITE: NEGATIVE
Protein, ur: NEGATIVE mg/dL
RBC / HPF: NONE SEEN RBC/hpf (ref 0–5)
SPECIFIC GRAVITY, URINE: 1.005 (ref 1.005–1.030)
Squamous Epithelial / LPF: NONE SEEN
pH: 7 (ref 5.0–8.0)

## 2016-06-26 LAB — CBC
HCT: 42.5 % (ref 40.0–52.0)
Hemoglobin: 15 g/dL (ref 13.0–18.0)
MCH: 36 pg — AB (ref 26.0–34.0)
MCHC: 35.3 g/dL (ref 32.0–36.0)
MCV: 102.1 fL — AB (ref 80.0–100.0)
PLATELETS: 291 10*3/uL (ref 150–440)
RBC: 4.16 MIL/uL — ABNORMAL LOW (ref 4.40–5.90)
RDW: 13.4 % (ref 11.5–14.5)
WBC: 12.2 10*3/uL — ABNORMAL HIGH (ref 3.8–10.6)

## 2016-06-26 LAB — TROPONIN I

## 2016-06-26 MED ORDER — SOTALOL HCL 120 MG PO TABS
120.0000 mg | ORAL_TABLET | Freq: Two times a day (BID) | ORAL | Status: DC
  Administered 2016-06-26 – 2016-06-27 (×3): 120 mg via ORAL
  Filled 2016-06-26 (×3): qty 1

## 2016-06-26 MED ORDER — DILTIAZEM HCL ER COATED BEADS 240 MG PO CP24
240.0000 mg | ORAL_CAPSULE | Freq: Once | ORAL | Status: AC
  Administered 2016-06-26: 240 mg via ORAL
  Filled 2016-06-26: qty 1

## 2016-06-26 MED ORDER — DILTIAZEM HCL 30 MG PO TABS
60.0000 mg | ORAL_TABLET | Freq: Three times a day (TID) | ORAL | Status: DC
  Administered 2016-06-26: 60 mg via ORAL
  Filled 2016-06-26: qty 2

## 2016-06-26 MED ORDER — DILTIAZEM LOAD VIA INFUSION
10.0000 mg | Freq: Once | INTRAVENOUS | Status: AC
  Administered 2016-06-26: 10 mg via INTRAVENOUS
  Filled 2016-06-26: qty 10

## 2016-06-26 MED ORDER — FLUTICASONE FUROATE 27.5 MCG/SPRAY NA SUSP: 1.0000 | Freq: Every day | NASAL | Status: DC

## 2016-06-26 MED ORDER — SODIUM CHLORIDE 0.9% FLUSH: 3.0000 mL | INTRAVENOUS | Status: DC | PRN

## 2016-06-26 MED ORDER — MAGNESIUM GLUCONATE 30 MG PO TABS: 30.0000 mg | ORAL_TABLET | Freq: Every day | ORAL | Status: DC

## 2016-06-26 MED ORDER — INSULIN ASPART 100 UNIT/ML ~~LOC~~ SOLN
0.0000 [IU] | Freq: Three times a day (TID) | SUBCUTANEOUS | Status: DC
Start: 2016-06-26 — End: 2016-06-27

## 2016-06-26 MED ORDER — SODIUM CHLORIDE 0.9% FLUSH: 3.0000 mL | Freq: Two times a day (BID) | INTRAVENOUS | Status: DC

## 2016-06-26 MED ORDER — METOPROLOL TARTRATE 5 MG/5ML IV SOLN: 5.0000 mg | Freq: Four times a day (QID) | INTRAVENOUS | Status: DC | PRN

## 2016-06-26 MED ORDER — SODIUM CHLORIDE 0.9 % IV SOLN: 250.0000 mL | INTRAVENOUS | Status: DC

## 2016-06-26 MED ORDER — DILTIAZEM HCL 25 MG/5ML IV SOLN
20.0000 mg | Freq: Once | INTRAVENOUS | Status: DC
  Filled 2016-06-26: qty 5

## 2016-06-26 MED ORDER — HYDROCODONE-ACETAMINOPHEN 5-325 MG PO TABS
1.0000 | ORAL_TABLET | Freq: Three times a day (TID) | ORAL | Status: DC
  Administered 2016-06-26 – 2016-06-27 (×4): 1 via ORAL
  Filled 2016-06-26 (×4): qty 1

## 2016-06-26 MED ORDER — METOPROLOL TARTRATE 50 MG PO TABS
75.0000 mg | ORAL_TABLET | Freq: Two times a day (BID) | ORAL | Status: DC
  Administered 2016-06-26 – 2016-06-27 (×3): 75 mg via ORAL
  Filled 2016-06-26 (×4): qty 1

## 2016-06-26 MED ORDER — TRAZODONE HCL 50 MG PO TABS
50.0000 mg | ORAL_TABLET | Freq: Every day | ORAL | Status: DC
  Filled 2016-06-26: qty 1

## 2016-06-26 MED ORDER — DILTIAZEM HCL 25 MG/5ML IV SOLN
10.0000 mg | Freq: Once | INTRAVENOUS | Status: AC
  Administered 2016-06-26: 10 mg via INTRAVENOUS

## 2016-06-26 MED ORDER — ACETAMINOPHEN 650 MG RE SUPP: 650.0000 mg | Freq: Four times a day (QID) | RECTAL | Status: DC | PRN

## 2016-06-26 MED ORDER — SERTRALINE HCL 100 MG PO TABS
100.0000 mg | ORAL_TABLET | Freq: Every day | ORAL | Status: DC
  Administered 2016-06-26 – 2016-06-27 (×2): 100 mg via ORAL
  Filled 2016-06-26 (×2): qty 1

## 2016-06-26 MED ORDER — SODIUM CHLORIDE 0.9% FLUSH
3.0000 mL | Freq: Two times a day (BID) | INTRAVENOUS | Status: DC
  Administered 2016-06-26: 3 mL via INTRAVENOUS

## 2016-06-26 MED ORDER — DILTIAZEM HCL ER COATED BEADS 240 MG PO CP24
240.0000 mg | ORAL_CAPSULE | Freq: Every day | ORAL | Status: DC
  Administered 2016-06-26: 240 mg via ORAL

## 2016-06-26 MED ORDER — DILTIAZEM HCL 100 MG IV SOLR
5.0000 mg/h | INTRAVENOUS | Status: DC
  Administered 2016-06-26: 12.5 mg/h via INTRAVENOUS
  Administered 2016-06-26: 5 mg/h via INTRAVENOUS
  Administered 2016-06-26 – 2016-06-27 (×2): 15 mg/h via INTRAVENOUS
  Filled 2016-06-26 (×4): qty 100

## 2016-06-26 MED ORDER — POTASSIUM 99 MG PO TABS: 99.0000 mg | ORAL_TABLET | Freq: Every day | ORAL | Status: DC

## 2016-06-26 MED ORDER — ACETAMINOPHEN 325 MG PO TABS: 650.0000 mg | ORAL_TABLET | Freq: Four times a day (QID) | ORAL | Status: DC | PRN

## 2016-06-26 MED ORDER — MAGNESIUM OXIDE 400 (241.3 MG) MG PO TABS
200.0000 mg | ORAL_TABLET | Freq: Every day | ORAL | Status: DC
  Administered 2016-06-26 – 2016-06-27 (×2): 200 mg via ORAL
  Filled 2016-06-26 (×2): qty 1

## 2016-06-26 MED ORDER — SODIUM CHLORIDE 0.9 % IV SOLN
INTRAVENOUS | Status: DC
  Administered 2016-06-26: 15:00:00 via INTRAVENOUS

## 2016-06-26 MED ORDER — SENNOSIDES-DOCUSATE SODIUM 8.6-50 MG PO TABS: 1.0000 | ORAL_TABLET | Freq: Every evening | ORAL | Status: DC | PRN

## 2016-06-26 MED ORDER — ZOLPIDEM TARTRATE 5 MG PO TABS
5.0000 mg | ORAL_TABLET | Freq: Every evening | ORAL | Status: DC | PRN
  Administered 2016-06-26: 5 mg via ORAL
  Filled 2016-06-26: qty 1

## 2016-06-26 MED ORDER — DIPHENHYDRAMINE HCL (SLEEP) 25 MG PO TABS: 25.0000 mg | ORAL_TABLET | Freq: Every evening | ORAL | Status: DC | PRN

## 2016-06-26 MED ORDER — SODIUM CHLORIDE 0.9 % IV BOLUS (SEPSIS)
1000.0000 mL | Freq: Once | INTRAVENOUS | Status: AC
  Administered 2016-06-26: 1000 mL via INTRAVENOUS

## 2016-06-26 MED ORDER — ONDANSETRON HCL 4 MG/2ML IJ SOLN: 4.0000 mg | Freq: Four times a day (QID) | INTRAMUSCULAR | Status: DC | PRN

## 2016-06-26 MED ORDER — INSULIN ASPART 100 UNIT/ML ~~LOC~~ SOLN: 0.0000 [IU] | Freq: Every day | SUBCUTANEOUS | Status: DC

## 2016-06-26 MED ORDER — FLUTICASONE PROPIONATE 50 MCG/ACT NA SUSP
1.0000 | Freq: Every day | NASAL | Status: DC
  Administered 2016-06-27: 2 via NASAL
  Filled 2016-06-26: qty 16

## 2016-06-26 MED ORDER — METOPROLOL TARTRATE 5 MG/5ML IV SOLN: 10.0000 mg | INTRAVENOUS | Status: DC | PRN

## 2016-06-26 MED ORDER — METFORMIN HCL ER 500 MG PO TB24
500.0000 mg | ORAL_TABLET | Freq: Three times a day (TID) | ORAL | Status: DC
Start: 2016-06-26 — End: 2016-06-27
  Administered 2016-06-26 – 2016-06-27 (×4): 500 mg via ORAL
  Filled 2016-06-26 (×4): qty 1

## 2016-06-26 MED ORDER — ONDANSETRON HCL 4 MG PO TABS: 4.0000 mg | ORAL_TABLET | Freq: Four times a day (QID) | ORAL | Status: DC | PRN

## 2016-06-26 MED ORDER — DIPHENHYDRAMINE HCL 25 MG PO CAPS
25.0000 mg | ORAL_CAPSULE | Freq: Every evening | ORAL | Status: DC | PRN
  Filled 2016-06-26: qty 1

## 2016-06-26 MED ORDER — APIXABAN 5 MG PO TABS
5.0000 mg | ORAL_TABLET | Freq: Two times a day (BID) | ORAL | Status: DC
  Administered 2016-06-26 – 2016-06-27 (×3): 5 mg via ORAL
  Filled 2016-06-26 (×3): qty 1

## 2016-06-26 NOTE — Plan of Care (Signed)
Problem: Activity: Goal: Ability to tolerate increased activity will improve Outcome: Progressing With activity patient became sob with exertion, and HR increased

## 2016-06-26 NOTE — ED Notes (Signed)
Pt reports so far tonight he has taken 12:15am 60mg  cardizem and 80mg  sotalol, and at 1:45am, 50mg  metoprolol.  Pt reports mid to left sided chest pain, described as aching, accompanied by sob, nausea, and lightheaded/dizziness, and diaphoresis.

## 2016-06-26 NOTE — ED Notes (Signed)
Patient reports was discharged from here yesterday and with dx of a-fib with RVR. Pt reports having chest pain, shortness of breath, and weakness since Sunday, states symptoms have not resolved. Pt alert and oriented x 4, pt speaking in complete sentences, respirations even and unlabored.
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
## 2016-06-26 NOTE — Progress Notes (Signed)
Patient HR increased 157 non-sustain while ambulating to bathroom. Nurse gave patient diltiazem 60mg . HR is currently 119. Communicated with Dr. Estanislado Pandy about medication change per pharmacy. New order: metoprolol 5mg  IV every 6hrs.

## 2016-06-26 NOTE — Plan of Care (Signed)
Problem: Cardiac: Goal: Ability to achieve and maintain adequate cardiopulmonary perfusion will improve Outcome: Progressing Patient and VS are stabled will continue to monitor

## 2016-06-26 NOTE — Plan of Care (Signed)
Problem: Education: Goal: Knowledge of disease or condition will improve Outcome: Progressing Provided handouts to the patient will continue to monitor

## 2016-06-26 NOTE — Progress Notes (Signed)
[name] at [place] NAME: Loyalty Fadler    MR#:  [id number]  DATE OF BIRTH:  1951/08/23  SUBJECTIVE:   Patient here with atrial fibrillation. Heart rate and 160s this morning. He has had ablation in the past.  REVIEW OF SYSTEMS:    Review of Systems  Constitutional: Negative for fever, chills and malaise/fatigue.  HENT: Negative for ear discharge, ear pain, hearing loss, nosebleeds and sore throat.   Eyes: Negative for blurred vision and pain.  Respiratory: Negative for cough, hemoptysis, shortness of breath and wheezing.   Cardiovascular: Positive for palpitations. Negative for chest pain and leg swelling.  Gastrointestinal: Negative for nausea, vomiting, abdominal pain, diarrhea and blood in stool.  Genitourinary: Negative for dysuria.  Musculoskeletal: Negative for back pain.  Neurological: Negative for dizziness, tremors, speech change, focal weakness, seizures and headaches.  Endo/Heme/Allergies: Does not bruise/bleed easily.  Psychiatric/Behavioral: Negative for depression, suicidal ideas and hallucinations.    Tolerating Diet:yes      DRUG ALLERGIES:   Allergies  Allergen Reactions  . Codeine Nausea Only and Other (See Comments)    headache  . Dolobid [Diflunisal] Other (See Comments)    Muscle spasms Difficulty breathing  . Sulfa Antibiotics Itching and Rash    VITALS:  Blood pressure 108/79, pulse 128, temperature 97.7 F (36.5 C), temperature source Oral, resp. rate 18, height 5\' 9"  (1.753 m), weight 103.964 kg (229 lb 3.2 oz), SpO2 95 %.  PHYSICAL EXAMINATION:   Physical Exam  Constitutional: He is oriented to person, place, and time and well-developed, well-nourished, and in no distress. No distress.  HENT:  Head: Normocephalic.  Eyes: No scleral icterus.  Neck: Normal range of motion. Neck supple. No JVD present. No tracheal deviation present.  Cardiovascular: Normal rate and regular rhythm.  Exam reveals  no gallop and no friction rub.   Murmur heard. Tachy irr, irr  Pulmonary/Chest: Effort normal and breath sounds normal. No respiratory distress. He has no wheezes. He has no rales. He exhibits no tenderness.  Abdominal: Soft. Bowel sounds are normal. He exhibits no distension and no mass. There is no tenderness. There is no rebound and no guarding.  Musculoskeletal: Normal range of motion. He exhibits no edema.  Neurological: He is alert and oriented to person, place, and time.  Skin: Skin is warm. No rash noted. No erythema.  Psychiatric: Affect and judgment normal.      LABORATORY PANEL:   CBC  Recent Labs Lab 06/26/16 0235  WBC 12.2*  HGB 15.0  HCT 42.5  PLT 291   ------------------------------------------------------------------------------------------------------------------  Chemistries   Recent Labs Lab 06/25/16 0843 06/26/16 0235  NA 137 135  K 4.1 3.8  CL 103 105  CO2 26 21*  GLUCOSE 136* 152*  BUN 15 16  CREATININE 0.95 1.01  CALCIUM 9.4 8.6*  AST 100*  --   ALT 129*  --   ALKPHOS 54  --   BILITOT 1.6*  --    ------------------------------------------------------------------------------------------------------------------  Cardiac Enzymes  Recent Labs Lab 06/25/16 0843 06/26/16 0235  TROPONINI <0.03 <0.03   ------------------------------------------------------------------------------------------------------------------  RADIOLOGY:  Dg Abd 1 View  06/26/2016  CLINICAL DATA:  Epigastric pain and nausea for 3 days EXAM: ABDOMEN - 1 VIEW COMPARISON:  CT abdomen and pelvis February 28, 2014 FINDINGS: There is moderate stool throughout the colon. There is no bowel dilatation or air-fluid level suggesting obstruction. No free air. Lung bases are clear. There are multiple vascular calcifications in the pelvis.  IMPRESSION: No bowel obstruction or free air.  Lung bases are clear. Electronically Signed   By: Lowella Grip III M.D.   On: 06/26/2016 07:18    Dg Chest Portable 1 View  06/25/2016  CLINICAL DATA:  Left chest pain and shortness of breath for the past 2 days. EXAM: PORTABLE CHEST 1 VIEW COMPARISON:  08/31/2015. FINDINGS: Normal sized heart. Clear lungs with normal vascularity. The central pulmonary arteries remain mildly prominent. Mild central peribronchial thickening. Diffuse osteopenia. IMPRESSION: Mild bronchitic changes. Electronically Signed   By: Claudie Revering M.D.   On: 06/25/2016 08:58     ASSESSMENT AND PLAN:   65 year old male with a history of PAF that is post ablation in March who presents with atrial fibrillation and RVR.  1. Atrial fibrillation with RVR: Patient's heart rates are still elevated. Stop by mouth diltiazem and changed to IV diltiazem with 10 mg load. Continue metoprolol and sotalol. Follow up on cardiology recommendations. Continue Eliquis for stroke prevention.   2. Diabetes: Continue sliding scale insulin and metformin.  3. Depression: Continue Zoloft.  4. Essential hypertension: Continue IV diltiazem and metoprolol. Monitor blood pressure closely   Management plans discussed with the patient and he is in agreement.  CODE STATUS: full  TOTAL TIME TAKING CARE OF THIS PATIENT: 27 minutes.   [id number]  POSSIBLE D/C 2-3 days, DEPENDING ON CLINICAL CONDITION.   Jermaine Tholl M.D on 06/26/2016 at 12:21 PM  Between 7am to 6pm - Pager - [phone] After 6pm go to [email or web address] - password [email or web address] [facility]  Office  [phone]  CC: Primary care physician; FITZGERALD, DAVID Mamie Nick, MD  Note: This dictation was prepared with Dragon dictation along with smaller phrase technology. Any transcriptional errors that result from this process are unintentional.
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
## 2016-06-26 NOTE — Progress Notes (Addendum)
MD notified. Pt is at 15mg /hr on cardizem gtt. Pt is tolerating gtt with heart rate in 110-120's sustained. With activity patients HR will sustain in the 160s, when HR elevated appears to be a-fib/flutter. When HR is lower appears to be SA with 1st degree heartblock. Orders to give PO metoprolol dose and to monitor.  VSS, no complaints of pain. I will continue to assess.
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
## 2016-06-27 ENCOUNTER — Inpatient Hospital Stay: Payer: BLUE CROSS/BLUE SHIELD | Admitting: Anesthesiology

## 2016-06-27 ENCOUNTER — Encounter: Payer: Self-pay | Admitting: Internal Medicine

## 2016-06-27 ENCOUNTER — Encounter
Admission: EM | Disposition: A | Discharge status: Home / Self Care | Payer: Self-pay | Source: Home / Self Care | Attending: Emergency Medicine

## 2016-06-27 DIAGNOSIS — I480 Paroxysmal atrial fibrillation: Secondary | ICD-10-CM | POA: Diagnosis not present

## 2016-06-27 HISTORY — PX: ELECTROPHYSIOLOGIC STUDY: SHX172A

## 2016-06-27 LAB — CBC
HEMATOCRIT: 43 % (ref 40.0–52.0)
HEMOGLOBIN: 15 g/dL (ref 13.0–18.0)
MCH: 35.8 pg — ABNORMAL HIGH (ref 26.0–34.0)
MCHC: 34.9 g/dL (ref 32.0–36.0)
MCV: 102.5 fL — AB (ref 80.0–100.0)
Platelets: 296 10*3/uL (ref 150–440)
RBC: 4.2 MIL/uL — AB (ref 4.40–5.90)
RDW: 13.6 % (ref 11.5–14.5)
WBC: 12.3 10*3/uL — AB (ref 3.8–10.6)

## 2016-06-27 LAB — BASIC METABOLIC PANEL
Anion gap: 8 (ref 5–15)
BUN: 11 mg/dL (ref 6–20)
CHLORIDE: 106 mmol/L (ref 101–111)
CO2: 23 mmol/L (ref 22–32)
Calcium: 8.2 mg/dL — ABNORMAL LOW (ref 8.9–10.3)
Creatinine, Ser: 0.8 mg/dL (ref 0.61–1.24)
GFR calc Af Amer: >60 mL/min (ref >60)
Glucose, Bld: 105 mg/dL — ABNORMAL HIGH (ref 65–99)
POTASSIUM: 3.5 mmol/L (ref 3.5–5.1)
Sodium: 137 mmol/L (ref 135–145)

## 2016-06-27 LAB — GLUCOSE, CAPILLARY: Glucose-Capillary: 123 mg/dL — ABNORMAL HIGH (ref 65–99)

## 2016-06-27 SURGERY — CARDIOVERSION (CATH LAB)
Anesthesia: General

## 2016-06-27 MED ORDER — PROPOFOL 10 MG/ML IV BOLUS
INTRAVENOUS | Status: DC | PRN
  Administered 2016-06-27: 20 mg via INTRAVENOUS
  Administered 2016-06-27: 50 mg via INTRAVENOUS

## 2016-06-27 MED ORDER — LIDOCAINE HCL (CARDIAC) 20 MG/ML IV SOLN
INTRAVENOUS | Status: DC | PRN
  Administered 2016-06-27: 60 mg via INTRATRACHEAL

## 2016-06-27 NOTE — Anesthesia Preprocedure Evaluation (Signed)
Anesthesia Evaluation  Patient identified by MRN, date of birth, ID band Patient awake    Reviewed: Allergy & Precautions, NPO status , Patient's Chart, lab work & pertinent test results  Airway Mallampati: II       Dental  (+) Teeth Intact   Pulmonary neg pulmonary ROS,     + decreased breath sounds      Cardiovascular Exercise Tolerance: Good hypertension, Pt. on medications  Rhythm:Irregular Rate:Tachycardia     Neuro/Psych negative neurological ROS     GI/Hepatic Neg liver ROS, GERD  Medicated,  Endo/Other  diabetes, Type 2, Oral Hypoglycemic Agents  Renal/GU      Musculoskeletal   Abdominal (+) + obese,   Peds  Hematology negative hematology ROS (+)   Anesthesia Other Findings   Reproductive/Obstetrics                             Anesthesia Physical Anesthesia Plan  ASA: III  Anesthesia Plan: General   Post-op Pain Management:    Induction: Intravenous  Airway Management Planned: Natural Airway and Nasal Cannula  Additional Equipment:   Intra-op Plan:   Post-operative Plan:   Informed Consent: I have reviewed the patients History and Physical, chart, labs and discussed the procedure including the risks, benefits and alternatives for the proposed anesthesia with the patient or authorized representative who has indicated his/her understanding and acceptance.     Plan Discussed with: CRNA  Anesthesia Plan Comments:         Anesthesia Quick Evaluation

## 2016-06-27 NOTE — Discharge Summary (Signed)
[facility] [place] NAME: [name]    MR#:  [id number]  DATE OF BIRTH:  Apr 08, 1951  DATE OF ADMISSION:  06/26/2016 ADMITTING PHYSICIAN: Quintella Baton, MD  DATE OF DISCHARGE: 06/27/2016  PRIMARY CARE PHYSICIAN: Leonel Ramsay, MD    ADMISSION DIAGNOSIS:  Atrial fibrillation with rapid ventricular response (HCC) [I48.91] Abdominal pain [R10.9] Chest pain, unspecified chest pain type [R07.9]  DISCHARGE DIAGNOSIS:  Active Problems:   Atrial fibrillation with RVR (HCC)   Atrial fibrillation with rapid ventricular response ([place])   SECONDARY DIAGNOSIS:   Past Medical History  Diagnosis Date  . Diabetes mellitus without complication ([place])   . GERD (gastroesophageal reflux disease)   . Heart murmur   . Hyperlipidemia   . Hypertension   . Thyroid disease   . Atrial fibrillation [facility])     HOSPITAL COURSE:   65 year old male with a history of PAF that is post ablation in March who presents with atrial fibrillation and RVR.  1. Atrial fibrillation with RVR: Patient was treated with IV diltiazem and diltiazem drip. This was ineffective. Patient underwent cardioversion. Patient is now in normal sinus rhythm with heart rates in the 80s. Patient will resume metoprolol and sotalol at discharge. Patient with close follow-up with cardiology. He will also follow-up with his endocrinologist in regards to his thyroid as an etiology for atrial fibrillation.   2. Diabetes: He will continue ADA diet and metformin..  3. Depression: Continue Zoloft.  4. Essential hypertension: Continue metoprolol.  DISCHARGE CONDITIONS AND DIET:   Stable for discharge on diabetic diet  CONSULTS OBTAINED:  Treatment Team:  [name], MD  DRUG ALLERGIES:   Allergies  Allergen Reactions  . Codeine Nausea Only and Other (See Comments)    headache  . Dolobid [Diflunisal] Other (See Comments)    Muscle spasms Difficulty breathing  . Sulfa  Antibiotics Itching and Rash    DISCHARGE MEDICATIONS:   Discharge Medication List as of 06/27/2016 10:11 AM    CONTINUE these medications which have NOT CHANGED   Details  apixaban (ELIQUIS) 5 MG TABS tablet Take 5 mg by mouth 2 (two) times daily., Until Discontinued, Historical Med    cholecalciferol (VITAMIN D) 1000 UNITS tablet Take 5,000 Units by mouth daily. , Until Discontinued, Historical Med    diphenhydrAMINE (SOMINEX) 25 MG tablet Take 25 mg by mouth at bedtime as needed for sleep. , Until Discontinued, Historical Med    fluticasone (VERAMYST) 27.5 MCG/SPRAY nasal spray Place 1-2 sprays into the nose daily. , Until Discontinued, Historical Med    magnesium gluconate (MAGONATE) 30 MG tablet Take 30 mg by mouth daily., Until Discontinued, Historical Med    metFORMIN (GLUCOPHAGE-XR) 500 MG 24 hr tablet Take 500 mg by mouth 3 (three) times daily. , Until Discontinued, Historical Med    metoprolol 75 MG TABS Take 75 mg by mouth 2 (two) times daily., Starting 09/01/2015, Until Discontinued, Print    Multiple Vitamin (MULTIVITAMIN) tablet Take 1 tablet by mouth daily., Until Discontinued, Historical Med    naproxen sodium (ANAPROX) 220 MG tablet Take 220 mg by mouth 2 (two) times daily as needed (pain). , Until Discontinued, Historical Med    Potassium 99 MG TABS Take 99 mg by mouth daily. , Until Discontinued, Historical Med    sertraline (ZOLOFT) 100 MG tablet Take 100 mg by mouth daily., Until Discontinued, Historical Med    sotalol (BETAPACE) 120 MG tablet Take 1 tablet (120 mg total)  by mouth 2 (two) times daily., Starting 09/01/2015, Until Discontinued, Print    traZODone (DESYREL) 50 MG tablet Take 1 tablet by mouth at bedtime., Starting 06/03/2016, Until Discontinued, Historical Med    vitamin B-12 (CYANOCOBALAMIN) 500 MCG tablet Take 500 mcg by mouth 4 (four) times a week. , Until Discontinued, Historical Med      STOP taking these medications     diltiazem (CARDIZEM)  60 MG tablet               Today   CHIEF COMPLAINT:   Patient doing well status post cardioversion.   VITAL SIGNS:  Blood pressure 129/83, pulse 66, temperature 98 F (36.7 C), temperature source Oral, resp. rate 18, height 5\' 9"  (1.753 m), weight 103.964 kg (229 lb 3.2 oz), SpO2 95 %.   REVIEW OF SYSTEMS:  Review of Systems  Constitutional: Negative for fever, chills and malaise/fatigue.  HENT: Negative for ear discharge, ear pain, hearing loss, nosebleeds and sore throat.   Eyes: Negative for blurred vision and pain.  Respiratory: Negative for cough, hemoptysis, shortness of breath and wheezing.   Cardiovascular: Negative for chest pain, palpitations and leg swelling.  Gastrointestinal: Negative for nausea, vomiting, abdominal pain, diarrhea and blood in stool.  Genitourinary: Negative for dysuria.  Musculoskeletal: Negative for back pain.  Neurological: Negative for dizziness, tremors, speech change, focal weakness, seizures and headaches.  Endo/Heme/Allergies: Does not bruise/bleed easily.  Psychiatric/Behavioral: Negative for depression, suicidal ideas and hallucinations.     PHYSICAL EXAMINATION:  GENERAL:  65 y.o.-year-old patient lying in the bed with no acute distress.  NECK:  Supple, no jugular venous distention. No thyroid enlargement, no tenderness.  LUNGS: Normal breath sounds bilaterally, no wheezing, rales,rhonchi  No use of accessory muscles of respiration.  CARDIOVASCULAR: S1, S2 normal. No murmurs, rubs, or gallops.  ABDOMEN: Soft, non-tender, non-distended. Bowel sounds present. No organomegaly or mass.  EXTREMITIES: No pedal edema, cyanosis, or clubbing.  PSYCHIATRIC: The patient is alert and oriented x 3.  SKIN: No obvious rash, lesion, or ulcer.   DATA REVIEW:   CBC  Recent Labs Lab 06/27/16 0516  WBC 12.3*  HGB 15.0  HCT 43.0  PLT 296    Chemistries   Recent Labs Lab 06/25/16 0843  06/27/16 0516  NA 137  < > 137  K 4.1  < >  3.5  CL 103  < > 106  CO2 26  < > 23  GLUCOSE 136*  < > 105*  BUN 15  < > 11  CREATININE 0.95  < > 0.80  CALCIUM 9.4  < > 8.2*  AST 100*  --   --   ALT 129*  --   --   ALKPHOS 54  --   --   BILITOT 1.6*  --   --   < > = values in this interval not displayed.  Cardiac Enzymes  Recent Labs Lab 06/25/16 0843 06/26/16 0235  TROPONINI <0.03 <0.03    Microbiology Results  @[email or web address] @  RADIOLOGY:  Dg Abd 1 View  06/26/2016  CLINICAL DATA:  Epigastric pain and nausea for 3 days EXAM: ABDOMEN - 1 VIEW COMPARISON:  CT abdomen and pelvis February 28, 2014 FINDINGS: There is moderate stool throughout the colon. There is no bowel dilatation or air-fluid level suggesting obstruction. No free air. Lung bases are clear. There are multiple vascular calcifications in the pelvis. IMPRESSION: No bowel obstruction or free air.  Lung bases are clear. Electronically Signed   By: Gwyndolyn Saxon  Jasmine December III M.D.   On: 06/26/2016 07:18      Management plans discussed with the patient and he is in agreement. Stable for discharge   Patient should follow up with PCP and cardiology  CODE STATUS:     Code Status Orders        Start     Ordered   06/26/16 0611  Full code   Continuous     06/26/16 0610    Code Status History    Date Active Date Inactive Code Status Order ID Comments User Context   08/31/2015  3:25 PM 09/01/2015  7:02 PM Full Code [id number]  Henreitta Leber, MD Inpatient      TOTAL TIME TAKING CARE OF THIS PATIENT: 35 minutes.    Note: This dictation was prepared with Dragon dictation along with smaller phrase technology. Any transcriptional errors that result from this process are unintentional.  Jesaiah Fabiano M.D on 06/27/2016 at 11:58 AM  Between 7am to 6pm - Pager - [phone] After 6pm go to [email or web address] - password [email or web address] [facility]  Office  [phone]  CC: Primary care physician; Leonel Ramsay, MD

## 2016-06-27 NOTE — Progress Notes (Signed)
Pt clinically stable post heart cath, Dr Nehemiah Massed speaking with patient and wife, sr per monitor, vss, on room air, pt awake, alert and oriented, report called to [place] on 2a with plan reviewed, denies complaints,

## 2016-06-27 NOTE — Transfer of Care (Signed)
Immediate Anesthesia Transfer of Care Note  Patient: [name].  Procedure(s) Performed: Procedure(s): Cardioversion (N/A)  Patient Location: Radiology  Anesthesia Type:General  Level of Consciousness: awake, alert , oriented and patient cooperative  Airway & Oxygen Therapy: Patient Spontanous Breathing and Patient connected to nasal cannula oxygen  Post-op Assessment: Report given to RN, Post -op Vital signs reviewed and stable and Patient moving all extremities X 4  Post vital signs: Reviewed and stable  Last Vitals:  Filed Vitals:   06/27/16 0830 06/27/16 0831  BP: 82/63   Pulse: 59 58  Temp:    Resp: 22 21    Last Pain:  Filed Vitals:   06/27/16 0832  PainSc: 0-No pain         Complications: No apparent anesthesia complications

## 2016-06-27 NOTE — CV Procedure (Signed)
Electrical Cardioversion Procedure Note [name] [id number] January 25, 1951  Procedure: Electrical Cardioversion Indications:  Atrial Fibrillation  Procedure Details Consent: Risks of procedure as well as the alternatives and risks of each were explained to the (patient/caregiver).  Consent for procedure obtained. Time Out: Verified patient identification, verified procedure, site/side was marked, verified correct patient position, special equipment/implants available, medications/allergies/relevent history reviewed, required imaging and test results available.  Performed  Patient placed on cardiac monitor, pulse oximetry, supplemental oxygen as necessary.  Sedation given: Benzodiazepines and Short-acting barbiturates Pacer pads placed anterior and posterior chest.  Cardioverted 1 time(s).  Cardioverted at 120J.  Evaluation Findings: Post procedure EKG shows: NSR Complications: None Patient did tolerate procedure well.   [name] 06/27/2016, 8:28 AM

## 2016-06-27 NOTE — Anesthesia Postprocedure Evaluation (Signed)
Anesthesia Post Note  Patient: [name].  Procedure(s) Performed: Procedure(s) (LRB): Cardioversion (N/A)  Patient location during evaluation: PACU Anesthesia Type: General Level of consciousness: awake Pain management: pain level controlled Vital Signs Assessment: post-procedure vital signs reviewed and stable Respiratory status: spontaneous breathing Cardiovascular status: blood pressure returned to baseline Anesthetic complications: no    Last Vitals:  Filed Vitals:   06/27/16 0830 06/27/16 0831  BP: 82/63   Pulse: 59 58  Temp:    Resp: 22 21    Last Pain:  Filed Vitals:   06/27/16 0832  PainSc: 0-No pain                 VAN STAVEREN,Kayslee Furey

## 2016-06-27 NOTE — Progress Notes (Signed)
[facility] Encounter Note  Patient: [name]. / Admit Date: 06/26/2016 / Date of Encounter: 06/27/2016, 8:41 AM   Subjective: Patient feeling well with better heart rate control status post cardioversion. Patient remaining in normal sinus rhythm status post cardioversion. Patient tolerated medications without difficulty  Review of Systems: Positive for: None Negative for: Vision change, hearing change, syncope, dizziness, nausea, vomiting,diarrhea, bloody stool, stomach pain, cough, congestion, diaphoresis, urinary frequency, urinary pain,skin lesions, skin rashes Others previously listed  Objective: Telemetry: Normal sinus rhythm Physical Exam: Blood pressure 120/77, pulse 102, temperature 98.1 F (36.7 C), temperature source Oral, resp. rate 20, height 5\' 9"  (1.753 m), weight 229 lb 3.2 oz (103.964 kg), SpO2 93 %. Body mass index is 33.83 kg/(m^2). General: Well developed, well nourished, in no acute distress. Head: Normocephalic, atraumatic, sclera non-icteric, no xanthomas, nares are without discharge. Neck: No apparent masses Lungs: Normal respirations with no wheezes, no rhonchi, no rales , no crackles   Heart: Regular rate and rhythm, normal S1 S2, no murmur, no rub, no gallop, PMI is normal size and placement, carotid upstroke normal without bruit, jugular venous pressure normal Abdomen: Soft, non-tender, non-distended with normoactive bowel sounds. No hepatosplenomegaly. Abdominal aorta is normal size without bruit Extremities: No edema, no clubbing, no cyanosis, no ulcers,  Peripheral: 2+ radial, 2+ femoral, 2+ dorsal pedal pulses Neuro: Alert and oriented. Moves all extremities spontaneously. Psych:  Responds to questions appropriately with a normal affect.   Intake/Output Summary (Last 24 hours) at 06/27/16 0841 Last data filed at 06/27/16 0830  Gross per 24 hour  Intake    440 ml  Output   2275 ml  Net  -1835 ml    Inpatient  Medications:  . [MAR Hold] apixaban  5 mg Oral BID  . [MAR Hold] fluticasone  1-2 spray Each Nare Daily  . [MAR Hold] HYDROcodone-acetaminophen  1 tablet Oral Q8H  . [MAR Hold] insulin aspart  0-15 Units Subcutaneous TID WC  . [MAR Hold] insulin aspart  0-5 Units Subcutaneous QHS  . [MAR Hold] magnesium oxide  200 mg Oral Daily  . [MAR Hold] metFORMIN  500 mg Oral TID  . [MAR Hold] metoprolol  75 mg Oral BID  . [MAR Hold] sertraline  100 mg Oral Daily  . [MAR Hold] sodium chloride flush  3 mL Intravenous Q12H  . [MAR Hold] sodium chloride flush  3 mL Intravenous Q12H  . [MAR Hold] sotalol  120 mg Oral BID   Infusions:  . sodium chloride 75 mL/hr at 06/26/16 1500  . sodium chloride 250 mL (06/26/16 1829)    Labs:  Recent Labs  06/26/16 0235 06/27/16 0516  NA 135 137  K 3.8 3.5  CL 105 106  CO2 21* 23  GLUCOSE 152* 105*  BUN 16 11  CREATININE 1.01 0.80  CALCIUM 8.6* 8.2*    Recent Labs  06/25/16 0843  AST 100*  ALT 129*  ALKPHOS 54  BILITOT 1.6*  PROT 8.2*  ALBUMIN 4.6    Recent Labs  06/26/16 0235 06/27/16 0516  WBC 12.2* 12.3*  HGB 15.0 15.0  HCT 42.5 43.0  MCV 102.1* 102.5*  PLT 291 296    Recent Labs  06/25/16 0843 06/26/16 0235  TROPONINI <0.03 <0.03   Invalid input(s): POCBNP No results for input(s): HGBA1C in the last 72 hours.   Weights: Filed Weights   06/26/16 0159 06/26/16 0620  Weight: 225 lb (102.059 kg) 229 lb 3.2 oz (103.964 kg)  Radiology/Studies:  Dg Abd 1 View  06/26/2016  CLINICAL DATA:  Epigastric pain and nausea for 3 days EXAM: ABDOMEN - 1 VIEW COMPARISON:  CT abdomen and pelvis February 28, 2014 FINDINGS: There is moderate stool throughout the colon. There is no bowel dilatation or air-fluid level suggesting obstruction. No free air. Lung bases are clear. There are multiple vascular calcifications in the pelvis. IMPRESSION: No bowel obstruction or free air.  Lung bases are clear. Electronically Signed   By: Lowella Grip III M.D.   On: 06/26/2016 07:18   Dg Chest Portable 1 View  06/25/2016  CLINICAL DATA:  Left chest pain and shortness of breath for the past 2 days. EXAM: PORTABLE CHEST 1 VIEW COMPARISON:  08/31/2015. FINDINGS: Normal sized heart. Clear lungs with normal vascularity. The central pulmonary arteries remain mildly prominent. Mild central peribronchial thickening. Diffuse osteopenia. IMPRESSION: Mild bronchitic changes. Electronically Signed   By: Claudie Revering M.D.   On: 06/25/2016 08:58     Assessment and Recommendation  65 y.o. male with` diabetes with complications essential hypertension mixed hyperlipidemia with atrial fibrillation status post recent ablation an apparent tenderness possible thyroid issues having atrial fibrillation with rapid ventricular rate now electrically cardioverted to normal sinus rhythm and improved 1. Continue anticoagulation for further risk reduction in stroke with atrial fibrillation 2. Continue sotalol temporarily for the next several weeks until patient has further evaluation of thyroid disease status post ablation and possible discontinuation at that time 3. No further cardiac intervention at this time 4. Okay for discharge to home with follow-up next week  Signed, Serafina Royals M.D. FACC

## 2016-06-27 NOTE — Progress Notes (Signed)
Patient started shift with heart rate in a.fibb and rate sustaining in 160s. Cardizem drip at 15. Heart rate begin to decreased to 130s. Given bedtime medication. Heart rate decreased to lows 100s. Maintained heart rate between mid 70s-high 80s. Heart rate converted to normal sinus rhythm. Blood pressure maintained above parameters. Attempted to decrease drip to 12.5. Patient heart rate increased slighlty to low 100s. Heart rhythm appeared to convert back to a.fibb. Assessed patient. Patient stated that he felt like he had changed back to a.fibb. Increased drip rate back to 15. BP remaining stable. Will continue to monitor. Updated patient wife via phone call.

## 2016-06-27 NOTE — Care Management (Signed)
On eliquis prior to admission. Lives at home with wife. Independent, active. PCP - Ola Spurr. No needs identified.

## 2016-06-27 NOTE — Progress Notes (Signed)
Discharge instructions along with home medication list and follow up gone over with patient and wife, both verbalized that they understood instructions. No rx given to patient. No c/o pain no distress noted.  Telemetry and iv x2 removed. Patient to be discharged home.

## 2016-07-15 ENCOUNTER — Inpatient Hospital Stay
Admission: EM | Admit: 2016-07-15 | Discharge: 2016-07-16 | DRG: 310 | Disposition: A | Discharge status: Home / Self Care | Payer: BLUE CROSS/BLUE SHIELD | Attending: Internal Medicine | Admitting: Internal Medicine

## 2016-07-15 ENCOUNTER — Encounter: Payer: Self-pay | Admitting: Internal Medicine

## 2016-07-15 ENCOUNTER — Emergency Department: Payer: BLUE CROSS/BLUE SHIELD

## 2016-07-15 DIAGNOSIS — J019 Acute sinusitis, unspecified: Secondary | ICD-10-CM | POA: Diagnosis present

## 2016-07-15 DIAGNOSIS — Z7901 Long term (current) use of anticoagulants: Secondary | ICD-10-CM | POA: Diagnosis not present

## 2016-07-15 DIAGNOSIS — Z79899 Other long term (current) drug therapy: Secondary | ICD-10-CM

## 2016-07-15 DIAGNOSIS — J329 Chronic sinusitis, unspecified: Secondary | ICD-10-CM | POA: Diagnosis present

## 2016-07-15 DIAGNOSIS — Z888 Allergy status to other drugs, medicaments and biological substances status: Secondary | ICD-10-CM

## 2016-07-15 DIAGNOSIS — E119 Type 2 diabetes mellitus without complications: Secondary | ICD-10-CM | POA: Diagnosis present

## 2016-07-15 DIAGNOSIS — I4891 Unspecified atrial fibrillation: Secondary | ICD-10-CM | POA: Diagnosis present

## 2016-07-15 DIAGNOSIS — E039 Hypothyroidism, unspecified: Secondary | ICD-10-CM | POA: Diagnosis present

## 2016-07-15 DIAGNOSIS — Z7984 Long term (current) use of oral hypoglycemic drugs: Secondary | ICD-10-CM

## 2016-07-15 DIAGNOSIS — Z885 Allergy status to narcotic agent status: Secondary | ICD-10-CM

## 2016-07-15 DIAGNOSIS — E785 Hyperlipidemia, unspecified: Secondary | ICD-10-CM | POA: Diagnosis present

## 2016-07-15 DIAGNOSIS — K219 Gastro-esophageal reflux disease without esophagitis: Secondary | ICD-10-CM | POA: Diagnosis present

## 2016-07-15 DIAGNOSIS — I10 Essential (primary) hypertension: Secondary | ICD-10-CM | POA: Diagnosis present

## 2016-07-15 DIAGNOSIS — Z882 Allergy status to sulfonamides status: Secondary | ICD-10-CM | POA: Diagnosis not present

## 2016-07-15 DIAGNOSIS — R079 Chest pain, unspecified: Secondary | ICD-10-CM

## 2016-07-15 DIAGNOSIS — Z8249 Family history of ischemic heart disease and other diseases of the circulatory system: Secondary | ICD-10-CM | POA: Diagnosis not present

## 2016-07-15 DIAGNOSIS — I4892 Unspecified atrial flutter: Secondary | ICD-10-CM | POA: Diagnosis present

## 2016-07-15 LAB — COMPREHENSIVE METABOLIC PANEL
ALBUMIN: 4.4 g/dL (ref 3.5–5.0)
ALK PHOS: 54 U/L (ref 38–126)
ALT: 55 U/L (ref 17–63)
AST: 48 U/L — AB (ref 15–41)
Anion gap: 10 (ref 5–15)
BUN: 18 mg/dL (ref 6–20)
CALCIUM: 9.6 mg/dL (ref 8.9–10.3)
CHLORIDE: 103 mmol/L (ref 101–111)
CO2: 24 mmol/L (ref 22–32)
CREATININE: 0.72 mg/dL (ref 0.61–1.24)
GFR calc non Af Amer: >60 mL/min (ref >60)
GLUCOSE: 134 mg/dL — AB (ref 65–99)
Potassium: 4.1 mmol/L (ref 3.5–5.1)
SODIUM: 137 mmol/L (ref 135–145)
Total Bilirubin: 1.7 mg/dL — ABNORMAL HIGH (ref 0.3–1.2)
Total Protein: 7.9 g/dL (ref 6.5–8.1)

## 2016-07-15 LAB — CBC WITH DIFFERENTIAL/PLATELET
BASOS ABS: 0.2 10*3/uL — AB (ref 0–0.1)
Basophils Relative: 1 %
EOS ABS: 1 10*3/uL — AB (ref 0–0.7)
Eosinophils Relative: 7 %
HCT: 44.8 % (ref 40.0–52.0)
HEMOGLOBIN: 15.9 g/dL (ref 13.0–18.0)
LYMPHS ABS: 3.9 10*3/uL — AB (ref 1.0–3.6)
Lymphocytes Relative: 27 %
MCH: 35.9 pg — AB (ref 26.0–34.0)
MCHC: 35.4 g/dL (ref 32.0–36.0)
MCV: 101.3 fL — ABNORMAL HIGH (ref 80.0–100.0)
Monocytes Absolute: 1.3 10*3/uL — ABNORMAL HIGH (ref 0.2–1.0)
Monocytes Relative: 9 %
NEUTROS PCT: 56 %
Neutro Abs: 8 10*3/uL — ABNORMAL HIGH (ref 1.4–6.5)
PLATELETS: 398 10*3/uL (ref 150–440)
RBC: 4.43 MIL/uL (ref 4.40–5.90)
RDW: 13.3 % (ref 11.5–14.5)
WBC: 14.3 10*3/uL — AB (ref 3.8–10.6)

## 2016-07-15 LAB — GLUCOSE, CAPILLARY
Glucose-Capillary: 123 mg/dL — ABNORMAL HIGH (ref 65–99)
Glucose-Capillary: 125 mg/dL — ABNORMAL HIGH (ref 65–99)
Glucose-Capillary: 122 mg/dL — ABNORMAL HIGH (ref 65–99)
Glucose-Capillary: 110 mg/dL — ABNORMAL HIGH (ref 65–99)
Glucose-Capillary: 136 mg/dL — ABNORMAL HIGH (ref 65–99)

## 2016-07-15 LAB — TROPONIN I: Troponin I: <0.03 ng/mL (ref <0.03)

## 2016-07-15 LAB — TSH: TSH: 2.199 u[IU]/mL (ref 0.350–4.500)

## 2016-07-15 LAB — PROTIME-INR
INR: 1.23
Prothrombin Time: 15.6 seconds — ABNORMAL HIGH (ref 11.4–15.2)

## 2016-07-15 LAB — APTT: aPTT: 32 seconds (ref 24–36)

## 2016-07-15 LAB — HEMOGLOBIN A1C: Hgb A1c MFr Bld: 5.3 % (ref 4.0–6.0)

## 2016-07-15 MED ORDER — SODIUM CHLORIDE 0.9 % IV SOLN
250.0000 mL | INTRAVENOUS | Status: DC
  Administered 2016-07-16: 08:00:00 via INTRAVENOUS

## 2016-07-15 MED ORDER — SODIUM CHLORIDE 0.9 % IV BOLUS (SEPSIS)
500.0000 mL | Freq: Once | INTRAVENOUS | Status: AC
Start: 2016-07-15 — End: 2016-07-15
  Administered 2016-07-15: 500 mL via INTRAVENOUS

## 2016-07-15 MED ORDER — FLUTICASONE FUROATE 27.5 MCG/SPRAY NA SUSP: 1.0000 | Freq: Every day | NASAL | Status: DC

## 2016-07-15 MED ORDER — DILTIAZEM HCL 25 MG/5ML IV SOLN
15.0000 mg | Freq: Once | INTRAVENOUS | Status: AC
  Administered 2016-07-15: 15 mg via INTRAVENOUS
  Filled 2016-07-15: qty 5

## 2016-07-15 MED ORDER — SODIUM CHLORIDE 0.9% FLUSH
3.0000 mL | Freq: Two times a day (BID) | INTRAVENOUS | Status: DC
  Administered 2016-07-16: 3 mL via INTRAVENOUS

## 2016-07-15 MED ORDER — MAGNESIUM OXIDE 400 (241.3 MG) MG PO TABS
200.0000 mg | ORAL_TABLET | Freq: Every day | ORAL | Status: DC
  Administered 2016-07-15 – 2016-07-16 (×2): 200 mg via ORAL
  Filled 2016-07-15 (×2): qty 1

## 2016-07-15 MED ORDER — DILTIAZEM HCL 100 MG IV SOLR
12.0000 mg/h | INTRAVENOUS | Status: DC
  Administered 2016-07-15 – 2016-07-16 (×3): 12 mg/h via INTRAVENOUS
  Filled 2016-07-15 (×4): qty 100

## 2016-07-15 MED ORDER — TRAZODONE HCL 50 MG PO TABS: 50.0000 mg | ORAL_TABLET | Freq: Every day | ORAL | Status: DC

## 2016-07-15 MED ORDER — SODIUM CHLORIDE 0.9% FLUSH
3.0000 mL | Freq: Two times a day (BID) | INTRAVENOUS | Status: DC
  Administered 2016-07-15 – 2016-07-16 (×3): 3 mL via INTRAVENOUS

## 2016-07-15 MED ORDER — ACETAMINOPHEN 650 MG RE SUPP: 650.0000 mg | Freq: Four times a day (QID) | RECTAL | Status: DC | PRN

## 2016-07-15 MED ORDER — DILTIAZEM HCL 25 MG/5ML IV SOLN
INTRAVENOUS | Status: AC
  Filled 2016-07-15: qty 5

## 2016-07-15 MED ORDER — ACETAMINOPHEN 325 MG PO TABS
650.0000 mg | ORAL_TABLET | Freq: Four times a day (QID) | ORAL | Status: DC | PRN
  Administered 2016-07-15: 650 mg via ORAL
  Filled 2016-07-15: qty 2

## 2016-07-15 MED ORDER — DIPHENHYDRAMINE HCL 25 MG PO CAPS
25.0000 mg | ORAL_CAPSULE | Freq: Every evening | ORAL | Status: DC | PRN
Start: 2016-07-15 — End: 2016-07-16
  Administered 2016-07-15: 25 mg via ORAL
  Filled 2016-07-15: qty 1

## 2016-07-15 MED ORDER — ONDANSETRON HCL 4 MG PO TABS: 4.0000 mg | ORAL_TABLET | Freq: Four times a day (QID) | ORAL | Status: DC | PRN

## 2016-07-15 MED ORDER — MAGNESIUM GLUCONATE 30 MG PO TABS: 30.0000 mg | ORAL_TABLET | Freq: Every day | ORAL | Status: DC

## 2016-07-15 MED ORDER — DIPHENHYDRAMINE HCL (SLEEP) 25 MG PO TABS: 25.0000 mg | ORAL_TABLET | Freq: Every evening | ORAL | Status: DC | PRN

## 2016-07-15 MED ORDER — SOTALOL HCL 120 MG PO TABS
120.0000 mg | ORAL_TABLET | Freq: Two times a day (BID) | ORAL | Status: DC
  Administered 2016-07-15 – 2016-07-16 (×3): 120 mg via ORAL
  Filled 2016-07-15 (×3): qty 1

## 2016-07-15 MED ORDER — DILTIAZEM HCL 25 MG/5ML IV SOLN
INTRAVENOUS | Status: AC
Start: 2016-07-15 — End: 2016-07-15
  Administered 2016-07-15: 15 mg via INTRAVENOUS
  Filled 2016-07-15: qty 5

## 2016-07-15 MED ORDER — METOPROLOL TARTRATE 50 MG PO TABS
75.0000 mg | ORAL_TABLET | Freq: Two times a day (BID) | ORAL | Status: DC
  Administered 2016-07-15 – 2016-07-16 (×3): 75 mg via ORAL
  Filled 2016-07-15 (×3): qty 2

## 2016-07-15 MED ORDER — DILTIAZEM HCL 60 MG PO TABS
90.0000 mg | ORAL_TABLET | Freq: Four times a day (QID) | ORAL | Status: DC
  Administered 2016-07-15: 90 mg via ORAL
  Filled 2016-07-15: qty 1

## 2016-07-15 MED ORDER — SERTRALINE HCL 100 MG PO TABS: 100.0000 mg | ORAL_TABLET | Freq: Every day | ORAL | Status: DC

## 2016-07-15 MED ORDER — VITAMIN B-12 1000 MCG PO TABS
500.0000 ug | ORAL_TABLET | ORAL | Status: DC
  Administered 2016-07-16: 500 ug via ORAL
  Filled 2016-07-15: qty 1

## 2016-07-15 MED ORDER — DILTIAZEM HCL 60 MG PO TABS
60.0000 mg | ORAL_TABLET | Freq: Four times a day (QID) | ORAL | Status: DC
Start: 2016-07-15 — End: 2016-07-15
  Administered 2016-07-15: 60 mg via ORAL
  Filled 2016-07-15: qty 1

## 2016-07-15 MED ORDER — FLUTICASONE PROPIONATE 50 MCG/ACT NA SUSP
1.0000 | Freq: Every day | NASAL | Status: DC
  Administered 2016-07-15: 2 via NASAL
  Administered 2016-07-16: 1 via NASAL
  Filled 2016-07-15: qty 16

## 2016-07-15 MED ORDER — VITAMIN D 1000 UNITS PO TABS
5000.0000 [IU] | ORAL_TABLET | Freq: Every day | ORAL | Status: DC
  Administered 2016-07-15 – 2016-07-16 (×2): 5000 [IU] via ORAL
  Filled 2016-07-15 (×2): qty 5

## 2016-07-15 MED ORDER — INSULIN ASPART 100 UNIT/ML ~~LOC~~ SOLN: 0.0000 [IU] | Freq: Every day | SUBCUTANEOUS | Status: DC

## 2016-07-15 MED ORDER — APIXABAN 5 MG PO TABS
5.0000 mg | ORAL_TABLET | Freq: Two times a day (BID) | ORAL | Status: DC
  Administered 2016-07-15 – 2016-07-16 (×3): 5 mg via ORAL
  Filled 2016-07-15 (×3): qty 1

## 2016-07-15 MED ORDER — ONDANSETRON HCL 4 MG/2ML IJ SOLN: 4.0000 mg | Freq: Four times a day (QID) | INTRAMUSCULAR | Status: DC | PRN

## 2016-07-15 MED ORDER — DILTIAZEM HCL 25 MG/5ML IV SOLN
15.0000 mg | Freq: Once | INTRAVENOUS | Status: AC
  Administered 2016-07-15: 15 mg via INTRAVENOUS

## 2016-07-15 MED ORDER — INSULIN ASPART 100 UNIT/ML ~~LOC~~ SOLN
0.0000 [IU] | Freq: Three times a day (TID) | SUBCUTANEOUS | Status: DC
  Administered 2016-07-15 (×2): 1 [IU] via SUBCUTANEOUS
  Filled 2016-07-15: qty 3
  Filled 2016-07-15: qty 1

## 2016-07-15 MED ORDER — SODIUM CHLORIDE 0.9% FLUSH: 3.0000 mL | INTRAVENOUS | Status: DC | PRN

## 2016-07-15 MED ORDER — DILTIAZEM HCL 25 MG/5ML IV SOLN: 15.0000 mg | Freq: Four times a day (QID) | INTRAVENOUS | Status: DC | PRN

## 2016-07-15 MED ORDER — AMOXICILLIN 500 MG PO CAPS
500.0000 mg | ORAL_CAPSULE | Freq: Three times a day (TID) | ORAL | Status: DC
  Administered 2016-07-15 – 2016-07-16 (×5): 500 mg via ORAL
  Filled 2016-07-15 (×5): qty 1

## 2016-07-15 MED ORDER — POTASSIUM 99 MG PO TABS: 99.0000 mg | ORAL_TABLET | Freq: Every day | ORAL | Status: DC

## 2016-07-15 MED ORDER — DOCUSATE SODIUM 100 MG PO CAPS
100.0000 mg | ORAL_CAPSULE | Freq: Two times a day (BID) | ORAL | Status: DC
  Administered 2016-07-15 – 2016-07-16 (×3): 100 mg via ORAL
  Filled 2016-07-15 (×3): qty 1

## 2016-07-15 MED ORDER — OXYCODONE HCL 5 MG PO TABS
5.0000 mg | ORAL_TABLET | ORAL | Status: DC | PRN
Start: 2016-07-15 — End: 2016-07-16
  Administered 2016-07-15: 5 mg via ORAL
  Filled 2016-07-15: qty 1

## 2016-07-15 MED ORDER — ADULT MULTIVITAMIN W/MINERALS CH
1.0000 | ORAL_TABLET | Freq: Every day | ORAL | Status: DC
  Administered 2016-07-15 – 2016-07-16 (×2): 1 via ORAL
  Filled 2016-07-15 (×2): qty 1

## 2016-07-15 NOTE — Progress Notes (Signed)
Patients family concerned about her health.  Patient plans to make efforts to get better physically.

## 2016-07-15 NOTE — Progress Notes (Signed)
15 mg Cardizem given to pt. Per MD order, oncoming nurse aware.

## 2016-07-15 NOTE — ED Notes (Signed)
Verbal order for repeat EKG. Performed and given to MD.

## 2016-07-15 NOTE — Progress Notes (Addendum)
Spoke with Dr. Saralyn Pilar via phone/ MD aware of HR/ orders to give  15mg  iv  Push once of cardizem  And start cardizem gtt at 12mg /hour/no orders to titrate/ if HR has not improved after 78min of gtt start time/ given another 15mg  iv push cardizem  Once/ will continue to monitor closely.
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
## 2016-07-15 NOTE — Progress Notes (Signed)
amion text sent to Dr. Saralyn Pilar to make aware of elevated HR 130-140s

## 2016-07-15 NOTE — Progress Notes (Signed)
Pt. HR running in sustaining in 120's Dr. Marcille Blanco paged, awaiting new order for Cardizem.

## 2016-07-15 NOTE — Progress Notes (Signed)
[facility] NAME: [name]    MRN#:  [id number]  DATE OF BIRTH:  [birth year]  SUBJECTIVE:  Hospital Day: 0 days [name] is a 65 y.o. male presenting with Chest Pain and Shortness of Breath .   Overnight events: Admitted to the hospital Interval Events: Elevated heart rate this morning requiring Cardizem bolus, no current complaints  REVIEW OF SYSTEMS:  CONSTITUTIONAL: No fever, fatigue or weakness.  EYES: No blurred or double vision.  EARS, NOSE, AND THROAT: No tinnitus or ear pain.  RESPIRATORY: No cough, shortness of breath, wheezing or hemoptysis.  CARDIOVASCULAR: NoCurrent chest pain, orthopnea, edema.  GASTROINTESTINAL: No nausea, vomiting, diarrhea or abdominal pain.  GENITOURINARY: No dysuria, hematuria.  ENDOCRINE: No polyuria, nocturia,  HEMATOLOGY: No anemia, easy bruising or bleeding SKIN: No rash or lesion. MUSCULOSKELETAL: No joint pain or arthritis.   NEUROLOGIC: No tingling, numbness, weakness.  PSYCHIATRY: No anxiety or depression.   DRUG ALLERGIES:   Allergies  Allergen Reactions  . Amiodarone   . Codeine Nausea Only and Other (See Comments)    headache  . Dolobid [Diflunisal] Other (See Comments)    Muscle spasms Difficulty breathing  . Sulfa Antibiotics Itching and Rash    VITALS:  Blood pressure 101/62, pulse (!) 110, temperature 97.7 F (36.5 C), temperature source Oral, resp. rate (!) 21, height 5\' 9"  (1.753 m), weight 226 lb 8 oz (102.7 kg), SpO2 96 %.  PHYSICAL EXAMINATION:  VITAL SIGNS: Vitals:   07/15/16 1012 07/15/16 1114  BP:  101/62  Pulse: (!) 108 (!) 110  Resp:  (!) 21  Temp:  97.7 F (36.5 C)   GENERAL:65 y.o.male currently in no acute distress.  HEAD: Normocephalic, atraumatic.  EYES: Pupils equal, round, reactive to light. Extraocular muscles intact. No scleral icterus.  MOUTH: Moist mucosal membrane. Dentition intact. No abscess noted.  EAR, NOSE, THROAT:  Clear without exudates. No external lesions.  NECK: Supple. No thyromegaly. No nodules. No JVD.  PULMONARY: Clear to ascultation, without wheeze rails or rhonci. No use of accessory muscles, Good respiratory effort. good air entry bilaterally CHEST: Nontender to palpation.  CARDIOVASCULAR: S1 and S2. Bradycardic. No murmurs, rubs, or gallops. No edema. Pedal pulses 2+ bilaterally.  GASTROINTESTINAL: Soft, nontender, nondistended. No masses. Positive bowel sounds. No hepatosplenomegaly.  MUSCULOSKELETAL: No swelling, clubbing, or edema. Range of motion full in all extremities.  NEUROLOGIC: Cranial nerves II through XII are intact. No gross focal neurological deficits. Sensation intact. Reflexes intact.  SKIN: No ulceration, lesions, rashes, or cyanosis. Skin warm and dry. Turgor intact.  PSYCHIATRIC: Mood, affect within normal limits. The patient is awake, alert and oriented x 3. Insight, judgment intact.      LABORATORY PANEL:   CBC  Recent Labs Lab 07/15/16 0353  WBC 14.3*  HGB 15.9  HCT 44.8  PLT 398   ------------------------------------------------------------------------------------------------------------------  Chemistries   Recent Labs Lab 07/15/16 0353  NA 137  K 4.1  CL 103  CO2 24  GLUCOSE 134*  BUN 18  CREATININE 0.72  CALCIUM 9.6  AST 48*  ALT 55  ALKPHOS 54  BILITOT 1.7*   ------------------------------------------------------------------------------------------------------------------  Cardiac Enzymes  Recent Labs Lab 07/15/16 0353  TROPONINI <0.03   ------------------------------------------------------------------------------------------------------------------  RADIOLOGY:  Dg Chest Portable 1 View  Result Date: 07/15/2016 CLINICAL DATA:  Chest pain, onset last night. Shortness of breath. Diaphoresis. EXAM: PORTABLE CHEST 1 VIEW COMPARISON:  Chest radiograph 06/25/2016 FINDINGS: Stable mild chronic bronchitic change. Borderline  hyperinflation. The heart size and mediastinal contours are stable. No pulmonary edema. No confluent airspace disease, pleural effusion or pneumothorax. Remote midshaft left clavicle fracture. IMPRESSION: Stable mild chronic bronchitic change.  No acute process. Electronically Signed   By: Jeb Levering M.D.   On: 07/15/2016 04:04   EKG:   Orders placed or performed during the hospital encounter of 07/15/16  . EKG 12-Lead  . EKG 12-Lead  . ED EKG within 10 minutes  . ED EKG within 10 minutes  . EKG 12-Lead  . EKG 12-Lead  . EKG 12-Lead pre-cardioversion  . EKG 12-Lead    ASSESSMENT AND PLAN:   [name] is a 65 y.o. male presenting with Chest Pain and Shortness of Breath . Admitted 07/15/2016 : Day #: 0 days 1. Atrophic fibrillation rapid ventricular response: Appreciate cardiology input current plans continue with increased dosage of sotalol patient may require DC cardioversion tomorrow Place nothing by mouth midnight 2. Type 2 diabetes non-insulin-requiring hold oral agents add sliding scale coverage 3. Venous thrombus embolism prophylactic: Therapeutic anticoagulation   All the records are reviewed and case discussed with Care Management/Social Workerr. Management plans discussed with the patient, family and they are in agreement.  CODE STATUS: full TOTAL TIME TAKING CARE OF THIS PATIENT: 28 minutes.   POSSIBLE D/C IN 1-2DAYS, DEPENDING ON CLINICAL CONDITION.   Hower,  Karenann Cai.D on 07/15/2016 at 12:05 PM  Between 7am to 6pm - Pager - [phone]  After 6pm: House Pager: - [phone]  [facility]  Office  [phone]  CC: Primary care physician; Leonel Ramsay, MD

## 2016-07-15 NOTE — ED Triage Notes (Signed)
Pt states CP started last night at 2100, pt took extra meds in attempt to correct tachycardia. Pt had dyspnea at rest and diaphoresis much of the night. Pt is presently pale and uncomfortable. Denies n/v.
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
## 2016-07-15 NOTE — Progress Notes (Signed)
Pt. Arrived to floor via stretcher, he walked to bed. Tele applied, A-fib, second verifier [place], CNA. General room orientation given. How to use call bell and ascom instruction given. Skin warm and dry with no skin issues noted. Second Financial controller, RN.

## 2016-07-15 NOTE — Progress Notes (Signed)
Pt remains in afib rvr / rate 130s-150s/ RN to bedside to assess/ pt exerting himself walking around room / encouraged to stay in bed/ no distress or chest pain reported/ MD is aware/ PO meds ordered/ asked MD about iv med?/ no order given / increased PO dose of Cardizem/ given to pt/  Cardioversion tomorrow.

## 2016-07-15 NOTE — Progress Notes (Signed)
CCMD called to make aware that pts HR 150-160s/ RN to bedside/ pt reports chest discomfort and palpitations/ no distress noted/ MD paged to make aware/ Orders given / Will continue to monitor.

## 2016-07-15 NOTE — Progress Notes (Addendum)
Orders given by cardiologist / will continue to monitor

## 2016-07-15 NOTE — Progress Notes (Signed)
PHARMACIST - PHYSICIAN ORDER COMMUNICATION  CONCERNING: P&T Medication Policy on Herbal Medications  DESCRIPTION:  This patient's order for:  Potassium TABS 99 mg   has been noted.  This product(s) is classified as an "herbal" or natural product. Due to a lack of definitive safety studies or FDA approval, nonstandard manufacturing practices, plus the potential risk of unknown drug-drug interactions while on inpatient medications, the Pharmacy and Therapeutics Committee does not permit the use of "herbal" or natural products of this type within [facility].   ACTION TAKEN: The pharmacy department is unable to verify this order at this time. Please reevaluate patient's clinical condition at discharge and address if the herbal or natural product(s) should be resumed at that time.
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
## 2016-07-15 NOTE — Care Management (Signed)
History of atrial fibrillation. Recent discharge 7/13 after cardioversion. Has had previous ablation. Presents with chest pain wand found to have heart rate 180.  Has received IV bolus of cardizem in the ED and again this morning on the unit. Has been evaluated by cardiology and sotalol dose (which had been decreased last week) has been increased. On chronic Eliquis.  Independent in all adls, denies issues accessing medical care, obtaining medications or with transportation.  Current with  PCP.  No discharge needs identified at present by care manager or members of care team

## 2016-07-16 ENCOUNTER — Inpatient Hospital Stay: Payer: BLUE CROSS/BLUE SHIELD | Admitting: Certified Registered"

## 2016-07-16 ENCOUNTER — Encounter: Payer: Self-pay | Admitting: *Deleted

## 2016-07-16 ENCOUNTER — Encounter
Admission: EM | Disposition: A | Discharge status: Home / Self Care | Payer: Self-pay | Source: Home / Self Care | Attending: Internal Medicine

## 2016-07-16 HISTORY — PX: ELECTROPHYSIOLOGIC STUDY: SHX172A

## 2016-07-16 LAB — GLUCOSE, CAPILLARY
Glucose-Capillary: 103 mg/dL — ABNORMAL HIGH (ref 65–99)
Glucose-Capillary: 175 mg/dL — ABNORMAL HIGH (ref 65–99)

## 2016-07-16 SURGERY — CARDIOVERSION (CATH LAB)
Anesthesia: General

## 2016-07-16 MED ORDER — SOTALOL HCL 120 MG PO TABS
120.0000 mg | ORAL_TABLET | Freq: Two times a day (BID) | ORAL | 0 refills | Status: DC
Start: 2016-07-16 — End: 2019-06-02

## 2016-07-16 MED ORDER — DILTIAZEM HCL ER COATED BEADS 120 MG PO CP24: 120.0000 mg | ORAL_CAPSULE | Freq: Every day | ORAL | 0 refills | Status: DC

## 2016-07-16 MED ORDER — PROPOFOL 10 MG/ML IV BOLUS
INTRAVENOUS | Status: DC | PRN
  Administered 2016-07-16: 80 mg via INTRAVENOUS

## 2016-07-16 MED ORDER — METOPROLOL TARTRATE 75 MG PO TABS: 75.0000 mg | ORAL_TABLET | Freq: Two times a day (BID) | ORAL | 0 refills | Status: DC

## 2016-07-16 MED ORDER — AMOXICILLIN 500 MG PO CAPS: 500.0000 mg | ORAL_CAPSULE | Freq: Three times a day (TID) | ORAL | 0 refills | Status: DC

## 2016-07-16 MED ORDER — DILTIAZEM HCL ER COATED BEADS 120 MG PO CP24
120.0000 mg | ORAL_CAPSULE | Freq: Every day | ORAL | Status: DC
  Administered 2016-07-16: 120 mg via ORAL
  Filled 2016-07-16: qty 1

## 2016-07-16 NOTE — Anesthesia Postprocedure Evaluation (Signed)
Anesthesia Post Note  Patient: [name].  Procedure(s) Performed: Procedure(s) (LRB): CARDIOVERSION (N/A)  Patient location during evaluation: PACU Anesthesia Type: General Level of consciousness: awake and alert Pain management: pain level controlled Vital Signs Assessment: post-procedure vital signs reviewed and stable Respiratory status: spontaneous breathing, nonlabored ventilation, respiratory function stable and patient connected to nasal cannula oxygen Cardiovascular status: blood pressure returned to baseline and stable Postop Assessment: no signs of nausea or vomiting Anesthetic complications: no    Last Vitals:  Vitals:   07/16/16 0930 07/16/16 1029  BP:  114/77  Pulse: 62 62  Resp:  18  Temp:      Last Pain:  Vitals:   07/16/16 0904  TempSrc: Oral  PainSc:                  Molli Barrows

## 2016-07-16 NOTE — Progress Notes (Signed)
Patient converted to NSR while on the cardizem drip. No complications noted. Patient is resting. Patient is NPO for possible cardioversion scheduled for 8/1. Will continue to monitor and assess.

## 2016-07-16 NOTE — Progress Notes (Signed)
Discharge instructions given to patient. IV and tele removed. Education given on new medications and aflutter. Verbalized understanding. No questions at this time. Patient instructed to check his BP frequently at home and to call cardiology office with any concerns until his follow up appointment.
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
## 2016-07-16 NOTE — Progress Notes (Signed)
Heart rate currently in the mid 60's sinus rhythm and BP is stable at 114/77. Dr. Saralyn Pilar stated patient can discharge this afternoon as long as he stays stable. Awaiting Dr. Lavetta Nielsen to come and see patient.

## 2016-07-16 NOTE — Progress Notes (Signed)
Patient back from cardioversion and is currently sinus brady in the upper 50's. Current BP is 112/72. While Dr. Saralyn Pilar on the floor after cardioversion, MD stated to go ahead and give all cardiac medications. Will closely monitor heart rate and BP.

## 2016-07-16 NOTE — Op Note (Signed)
[facility] Cardiology   07/15/2016 - 07/16/2016                     8:16 AM  PATIENT:  [name].    PRE-OPERATIVE DIAGNOSIS:  In Pt Rm 255     Cardioversion    Afib  POST-OPERATIVE DIAGNOSIS:  Same  PROCEDURE:  CARDIOVERSION  SURGEON:  Lerry Cordrey, MD    ANESTHESIA:     PREOPERATIVE INDICATIONS:  [name]. is a  65 y.o. male with a diagnosis of In Pt Rm 255     Cardioversion    Afib who failed conservative measures and elected for surgical management.    The risks benefits and alternatives were discussed with the patient preoperatively including but not limited to the risks of infection, bleeding, cardiopulmonary complications, the need for revision surgery, among others, and the patient was willing to proceed.   OPERATIVE PROCEDURE: The patient was brought to the special procedures holding area in a fasting state. He received 80 mg of intravenous propofol. He underwent elective electrical cardioversion, receiving 50 J and all 100 J with conversion to normal sinus rhythm. There were no complications.

## 2016-07-16 NOTE — Anesthesia Procedure Notes (Signed)
Performed by: Ledford Goodson Pre-anesthesia Checklist: Patient identified, Emergency Drugs available, Suction available, Patient being monitored and Timeout performed Patient Re-evaluated:Patient Re-evaluated prior to inductionOxygen Delivery Method: Nasal cannula Intubation Type: IV induction       

## 2016-07-16 NOTE — Discharge Summary (Signed)
[facility] [place] NAME: [name]    MR#:  [id number]  DATE OF BIRTH:  November 13, 1951  DATE OF ADMISSION:  07/15/2016 ADMITTING PHYSICIAN: Harrie Foreman, MD  DATE OF DISCHARGE: 07/16/16  PRIMARY CARE PHYSICIAN: Leonel Ramsay, MD    ADMISSION DIAGNOSIS:  Atrial fibrillation with rapid ventricular response (HCC) [I48.91] Chest pain, unspecified chest pain type [R07.9]  DISCHARGE DIAGNOSIS:  Atrial fibrillation with rapid ventricular response Sinusitis   SECONDARY DIAGNOSIS:   Past Medical History:  Diagnosis Date  . Atrial fibrillation ([place])   . Diabetes mellitus without complication ([place])   . GERD (gastroesophageal reflux disease)   . Heart murmur   . Hyperlipidemia   . Hypertension   . Thyroid disease     HOSPITAL COURSE:  [name]  is a 65 y.o. male admitted 07/15/2016 with chief complaint Chest Pain and Shortness of Breath . Please see H&P performed by Harrie Foreman, MD for further information. He presented with the above symptoms found to be in Afib RVR. Evaluated by cardiology during his stay, had DCCV with return to NSR. Also medication adjustments  DISCHARGE CONDITIONS:   stable  CONSULTS OBTAINED:  Treatment Team:  Isaias Cowman, MD  DRUG ALLERGIES:   Allergies  Allergen Reactions  . Amiodarone   . Codeine Nausea Only and Other (See Comments)    headache  . Dolobid [Diflunisal] Other (See Comments)    Muscle spasms Difficulty breathing  . Sulfa Antibiotics Itching and Rash    DISCHARGE MEDICATIONS:   Current Discharge Medication List    START taking these medications   Details  amoxicillin (AMOXIL) 500 MG capsule Take 1 capsule (500 mg total) by mouth 3 (three) times daily. Qty: 9 capsule, Refills: 0    diltiazem (CARDIZEM CD) 120 MG 24 hr capsule Take 1 capsule (120 mg total) by mouth daily. Qty: 30 capsule, Refills: 0      CONTINUE these medications which have CHANGED    Details  metoprolol 75 MG TABS Take 75 mg by mouth 2 (two) times daily. Qty: 60 tablet, Refills: 0    sotalol (BETAPACE) 120 MG tablet Take 1 tablet (120 mg total) by mouth 2 (two) times daily. Qty: 60 tablet, Refills: 0      CONTINUE these medications which have NOT CHANGED   Details  apixaban (ELIQUIS) 5 MG TABS tablet Take 5 mg by mouth 2 (two) times daily.    cholecalciferol (VITAMIN D) 1000 UNITS tablet Take 5,000 Units by mouth daily.     diphenhydrAMINE (SOMINEX) 25 MG tablet Take 25 mg by mouth at bedtime as needed for sleep.     fluticasone (FLONASE) 50 MCG/ACT nasal spray Place 1-2 sprays into both nostrils daily.    ibuprofen (ADVIL,MOTRIN) 200 MG tablet Take 400 mg by mouth 2 (two) times daily as needed for mild pain.    magnesium gluconate (MAGONATE) 30 MG tablet Take 30 mg by mouth daily.    metFORMIN (GLUCOPHAGE-XR) 500 MG 24 hr tablet Take 500 mg by mouth 3 (three) times daily.     Multiple Vitamin (MULTIVITAMIN) tablet Take 1 tablet by mouth daily.    naproxen sodium (ANAPROX) 220 MG tablet Take 220 mg by mouth 2 (two) times daily as needed (pain).     Potassium 99 MG TABS Take 99 mg by mouth daily.     vitamin B-12 (CYANOCOBALAMIN) 500 MCG tablet Take 500 mcg by mouth 4 (four) times a week.  DISCHARGE INSTRUCTIONS:    DIET:  Cardiac diet  DISCHARGE CONDITION:  Stable  ACTIVITY:  Activity as tolerated  OXYGEN:  Home Oxygen: No.   Oxygen Delivery: room air  DISCHARGE LOCATION:  home   If you experience worsening of your admission symptoms, develop shortness of breath, life threatening emergency, suicidal or homicidal thoughts you must seek medical attention immediately by calling 911 or calling your MD immediately  if symptoms less severe.  You Must read complete instructions/literature along with all the possible adverse reactions/side effects for all the Medicines you take and that have been prescribed to you. Take any new  Medicines after you have completely understood and accpet all the possible adverse reactions/side effects.   Please note  You were cared for by a hospitalist during your hospital stay. If you have any questions about your discharge medications or the care you received while you were in the hospital after you are discharged, you can call the unit and asked to speak with the hospitalist on call if the hospitalist that took care of you is not available. Once you are discharged, your primary care physician will handle any further medical issues. Please note that NO REFILLS for any discharge medications will be authorized once you are discharged, as it is imperative that you return to your primary care physician (or establish a relationship with a primary care physician if you do not have one) for your aftercare needs so that they can reassess your need for medications and monitor your lab values.    On the day of Discharge:   VITAL SIGNS:  Blood pressure 120/74, pulse 66, temperature 98 F (36.7 C), temperature source Oral, resp. rate 18, height 5\' 9"  (1.753 m), weight 223 lb 8 oz (101.4 kg), SpO2 97 %.  I/O:   Intake/Output Summary (Last 24 hours) at 07/16/16 1158 Last data filed at 07/16/16 1137  Gross per 24 hour  Intake            435.2 ml  Output             2750 ml  Net          -2314.8 ml    PHYSICAL EXAMINATION:  GENERAL:  65 y.o.-year-old patient lying in the bed with no acute distress.  EYES: Pupils equal, round, reactive to light and accommodation. No scleral icterus. Extraocular muscles intact.  HEENT: Head atraumatic, normocephalic. Oropharynx and nasopharynx clear.  NECK:  Supple, no jugular venous distention. No thyroid enlargement, no tenderness.  LUNGS: Normal breath sounds bilaterally, no wheezing, rales,rhonchi or crepitation. No use of accessory muscles of respiration.  CARDIOVASCULAR: S1, S2 normal. No murmurs, rubs, or gallops.  ABDOMEN: Soft, non-tender,  non-distended. Bowel sounds present. No organomegaly or mass.  EXTREMITIES: No pedal edema, cyanosis, or clubbing.  NEUROLOGIC: Cranial nerves II through XII are intact. Muscle strength 5/5 in all extremities. Sensation intact. Gait not checked.  PSYCHIATRIC: The patient is alert and oriented x 3.  SKIN: No obvious rash, lesion, or ulcer.   DATA REVIEW:   CBC  Recent Labs Lab 07/15/16 0353  WBC 14.3*  HGB 15.9  HCT 44.8  PLT 398    Chemistries   Recent Labs Lab 07/15/16 0353  NA 137  K 4.1  CL 103  CO2 24  GLUCOSE 134*  BUN 18  CREATININE 0.72  CALCIUM 9.6  AST 48*  ALT 55  ALKPHOS 54  BILITOT 1.7*    Cardiac Enzymes  Recent Labs Lab 07/15/16 0353  TROPONINI <0.03    Microbiology Results  Results for orders placed or performed during the hospital encounter of 08/31/15  MRSA PCR Screening     Status: None   Collection Time: 08/31/15  3:21 PM  Result Value Ref Range Status   MRSA by PCR NEGATIVE NEGATIVE Final    Comment:        The GeneXpert MRSA Assay (FDA approved for NASAL specimens only), is one component of a comprehensive MRSA colonization surveillance program. It is not intended to diagnose MRSA infection nor to guide or monitor treatment for MRSA infections.     RADIOLOGY:  Dg Chest Portable 1 View  Result Date: 07/15/2016 CLINICAL DATA:  Chest pain, onset last night. Shortness of breath. Diaphoresis. EXAM: PORTABLE CHEST 1 VIEW COMPARISON:  Chest radiograph 06/25/2016 FINDINGS: Stable mild chronic bronchitic change. Borderline hyperinflation. The heart size and mediastinal contours are stable. No pulmonary edema. No confluent airspace disease, pleural effusion or pneumothorax. Remote midshaft left clavicle fracture. IMPRESSION: Stable mild chronic bronchitic change.  No acute process. Electronically Signed   By: Jeb Levering M.D.   On: 07/15/2016 04:04    Management plans discussed with the patient, family and they are in  agreement.  CODE STATUS:     Code Status Orders        Start     Ordered   07/15/16 0621  Full code  Continuous     07/15/16 0620    Code Status History    Date Active Date Inactive Code Status Order ID Comments User Context   06/26/2016  6:10 AM 06/27/2016  2:06 PM Full Code [id number]  Quintella Baton, MD ED   08/31/2015  3:25 PM 09/01/2015  7:02 PM Full Code [id number]  Henreitta Leber, MD Inpatient      TOTAL TIME TAKING CARE OF THIS PATIENT: 32 minutes.    Hower,  Karenann Cai.D on 07/16/2016 at 11:58 AM  Between 7am to 6pm - Pager - [phone]  After 6pm go to [email or web address] - [employer] [facility]  Office  [phone]  CC: Primary care physician; Leonel Ramsay, MD

## 2016-07-16 NOTE — Transfer of Care (Signed)
Immediate Anesthesia Transfer of Care Note  Patient: [name].  Procedure(s) Performed: Procedure(s): CARDIOVERSION (N/A)  Patient Location: PACU  Anesthesia Type:General  Level of Consciousness: sedated and responds to stimulation  Airway & Oxygen Therapy: Patient Spontanous Breathing and Patient connected to nasal cannula oxygen  Post-op Assessment: Report given to RN and Post -op Vital signs reviewed and stable  Post vital signs: Reviewed and stable  Last Vitals:  Vitals:   07/16/16 0806 07/16/16 0807  BP:  97/64  Pulse: (!) 54 (!) 54  Resp: 19 19  Temp:      Last Pain:  Vitals:   07/16/16 0427  TempSrc: Oral  PainSc:          Complications: No apparent anesthesia complications

## 2016-07-17 ENCOUNTER — Encounter: Payer: Self-pay | Admitting: Cardiology

## 2016-09-25 ENCOUNTER — Emergency Department
Admission: EM | Admit: 2016-09-25 | Discharge: 2016-09-25 | Disposition: A | Discharge status: Home / Self Care | Payer: BLUE CROSS/BLUE SHIELD | Attending: Emergency Medicine | Admitting: Emergency Medicine

## 2016-09-25 DIAGNOSIS — Z79899 Other long term (current) drug therapy: Secondary | ICD-10-CM | POA: Diagnosis not present

## 2016-09-25 DIAGNOSIS — Z7984 Long term (current) use of oral hypoglycemic drugs: Secondary | ICD-10-CM | POA: Diagnosis not present

## 2016-09-25 DIAGNOSIS — R0789 Other chest pain: Secondary | ICD-10-CM | POA: Diagnosis present

## 2016-09-25 DIAGNOSIS — Z7901 Long term (current) use of anticoagulants: Secondary | ICD-10-CM | POA: Diagnosis not present

## 2016-09-25 DIAGNOSIS — I10 Essential (primary) hypertension: Secondary | ICD-10-CM | POA: Diagnosis not present

## 2016-09-25 DIAGNOSIS — I480 Paroxysmal atrial fibrillation: Secondary | ICD-10-CM | POA: Insufficient documentation

## 2016-09-25 DIAGNOSIS — R079 Chest pain, unspecified: Secondary | ICD-10-CM

## 2016-09-25 DIAGNOSIS — E119 Type 2 diabetes mellitus without complications: Secondary | ICD-10-CM | POA: Diagnosis not present

## 2016-09-25 DIAGNOSIS — I4891 Unspecified atrial fibrillation: Secondary | ICD-10-CM

## 2016-09-25 LAB — CBC WITH DIFFERENTIAL/PLATELET
BASOS ABS: 0.2 10*3/uL — AB (ref 0–0.1)
Basophils Relative: 2 %
EOS ABS: 0.8 10*3/uL — AB (ref 0–0.7)
EOS PCT: 6 %
HCT: 47.7 % (ref 40.0–52.0)
Hemoglobin: 16.3 g/dL (ref 13.0–18.0)
Lymphocytes Relative: 28 %
Lymphs Abs: 3.5 10*3/uL (ref 1.0–3.6)
MCH: 35.1 pg — ABNORMAL HIGH (ref 26.0–34.0)
MCHC: 34.2 g/dL (ref 32.0–36.0)
MCV: 102.7 fL — ABNORMAL HIGH (ref 80.0–100.0)
Monocytes Absolute: 1.2 10*3/uL — ABNORMAL HIGH (ref 0.2–1.0)
Monocytes Relative: 10 %
Neutro Abs: 6.6 10*3/uL — ABNORMAL HIGH (ref 1.4–6.5)
Neutrophils Relative %: 54 %
PLATELETS: 356 10*3/uL (ref 150–440)
RBC: 4.65 MIL/uL (ref 4.40–5.90)
RDW: 14.2 % (ref 11.5–14.5)
WBC: 12.2 10*3/uL — AB (ref 3.8–10.6)

## 2016-09-25 LAB — TROPONIN I: Troponin I: <0.03 ng/mL (ref <0.03)

## 2016-09-25 LAB — PROTIME-INR
INR: 1.11
PROTHROMBIN TIME: 14.3 s (ref 11.4–15.2)

## 2016-09-25 LAB — COMPREHENSIVE METABOLIC PANEL
ALBUMIN: 4.5 g/dL (ref 3.5–5.0)
ALT: 59 U/L (ref 17–63)
ANION GAP: 11 (ref 5–15)
AST: 47 U/L — ABNORMAL HIGH (ref 15–41)
Alkaline Phosphatase: 53 U/L (ref 38–126)
BUN: 13 mg/dL (ref 6–20)
CO2: 23 mmol/L (ref 22–32)
Calcium: 9.8 mg/dL (ref 8.9–10.3)
Chloride: 104 mmol/L (ref 101–111)
Creatinine, Ser: 1.21 mg/dL (ref 0.61–1.24)
GFR calc Af Amer: >60 mL/min (ref >60)
GFR calc non Af Amer: >60 mL/min (ref >60)
GLUCOSE: 143 mg/dL — AB (ref 65–99)
POTASSIUM: 4.1 mmol/L (ref 3.5–5.1)
SODIUM: 138 mmol/L (ref 135–145)
TOTAL PROTEIN: 8.1 g/dL (ref 6.5–8.1)
Total Bilirubin: 1.1 mg/dL (ref 0.3–1.2)

## 2016-09-25 LAB — MAGNESIUM: Magnesium: 2.2 mg/dL (ref 1.7–2.4)

## 2016-09-25 MED ORDER — ETOMIDATE 2 MG/ML IV SOLN
10.0000 mg | Freq: Once | INTRAVENOUS | Status: AC
  Administered 2016-09-25: 10 mg via INTRAVENOUS

## 2016-09-25 MED ORDER — DILTIAZEM HCL 25 MG/5ML IV SOLN
15.0000 mg | INTRAVENOUS | Status: AC
  Administered 2016-09-25: 15 mg via INTRAVENOUS
  Filled 2016-09-25: qty 5

## 2016-09-25 MED ORDER — SODIUM CHLORIDE 0.9 % IV BOLUS (SEPSIS)
1000.0000 mL | INTRAVENOUS | Status: AC
  Administered 2016-09-25: 1000 mL via INTRAVENOUS

## 2016-09-25 NOTE — ED Notes (Addendum)
Iran Planas rn continuously with pt until the writing of this note. Pt oob to bsc with 1 person assist. Has returned to baseline and awaiting d/c
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
## 2016-09-25 NOTE — ED Notes (Signed)
Dr Karma Greaser at bedside to explain procedure to wife and pt. zoll pads placed on pt, zoll on monitor. Aten with CAP in place, 4L o2. Suction, ambu bag, intubation supplies at bedside and set up. Amy moffitt, rn, patricia surles, rn Naviyah Schaffert, rn  Dr Karma Greaser at bedside during procedure.

## 2016-09-25 NOTE — Discharge Instructions (Signed)
As we discussed, since your atrial fibrillation was fixed with cardioversion and your cardiologist were comfortable with the plan for outpatient follow-up, we will discharge you for close follow-up as an outpatient.  If you develop any new or worsening symptoms, please return immediately to the nearest emergency department.

## 2016-09-25 NOTE — ED Notes (Signed)
Pt alert and oriented at this time.

## 2016-09-25 NOTE — ED Notes (Addendum)
Pt s/p cardioversion a/o, gcs 15, hr improved to 60 NSR. bp improved 110/72. Denies chest pain 0/10.

## 2016-09-25 NOTE — ED Triage Notes (Signed)
Pt with cardiac history was awakened at midnight with chest pain and rapid HR, sob.

## 2016-09-29 ENCOUNTER — Emergency Department
Admission: EM | Admit: 2016-09-29 | Discharge: 2016-09-29 | Disposition: A | Discharge status: Home / Self Care | Payer: BLUE CROSS/BLUE SHIELD | Attending: Emergency Medicine | Admitting: Emergency Medicine

## 2016-09-29 ENCOUNTER — Encounter: Payer: Self-pay | Admitting: Emergency Medicine

## 2016-09-29 DIAGNOSIS — Z7984 Long term (current) use of oral hypoglycemic drugs: Secondary | ICD-10-CM | POA: Insufficient documentation

## 2016-09-29 DIAGNOSIS — I10 Essential (primary) hypertension: Secondary | ICD-10-CM | POA: Insufficient documentation

## 2016-09-29 DIAGNOSIS — I4891 Unspecified atrial fibrillation: Secondary | ICD-10-CM

## 2016-09-29 DIAGNOSIS — I480 Paroxysmal atrial fibrillation: Secondary | ICD-10-CM | POA: Diagnosis not present

## 2016-09-29 DIAGNOSIS — E119 Type 2 diabetes mellitus without complications: Secondary | ICD-10-CM | POA: Diagnosis not present

## 2016-09-29 DIAGNOSIS — Z79899 Other long term (current) drug therapy: Secondary | ICD-10-CM | POA: Diagnosis not present

## 2016-09-29 DIAGNOSIS — R0789 Other chest pain: Secondary | ICD-10-CM | POA: Diagnosis present

## 2016-09-29 LAB — COMPREHENSIVE METABOLIC PANEL
ALBUMIN: 4.5 g/dL (ref 3.5–5.0)
ALT: 55 U/L (ref 17–63)
AST: 42 U/L — AB (ref 15–41)
Alkaline Phosphatase: 51 U/L (ref 38–126)
Anion gap: 10 (ref 5–15)
BUN: 10 mg/dL (ref 6–20)
CHLORIDE: 106 mmol/L (ref 101–111)
CO2: 24 mmol/L (ref 22–32)
CREATININE: 0.91 mg/dL (ref 0.61–1.24)
Calcium: 9.6 mg/dL (ref 8.9–10.3)
GFR calc Af Amer: >60 mL/min (ref >60)
GFR calc non Af Amer: >60 mL/min (ref >60)
Glucose, Bld: 126 mg/dL — ABNORMAL HIGH (ref 65–99)
POTASSIUM: 3.5 mmol/L (ref 3.5–5.1)
SODIUM: 140 mmol/L (ref 135–145)
Total Bilirubin: 0.7 mg/dL (ref 0.3–1.2)
Total Protein: 8.4 g/dL — ABNORMAL HIGH (ref 6.5–8.1)

## 2016-09-29 LAB — CBC
HCT: 47.1 % (ref 40.0–52.0)
Hemoglobin: 16.6 g/dL (ref 13.0–18.0)
MCH: 35.7 pg — ABNORMAL HIGH (ref 26.0–34.0)
MCHC: 35.2 g/dL (ref 32.0–36.0)
MCV: 101.5 fL — ABNORMAL HIGH (ref 80.0–100.0)
PLATELETS: 386 10*3/uL (ref 150–440)
RBC: 4.63 MIL/uL (ref 4.40–5.90)
RDW: 14.1 % (ref 11.5–14.5)
WBC: 11.6 10*3/uL — AB (ref 3.8–10.6)

## 2016-09-29 MED ORDER — ETOMIDATE 2 MG/ML IV SOLN
INTRAVENOUS | Status: AC | PRN
  Administered 2016-09-29: 10 mg via INTRAVENOUS

## 2016-09-29 MED ORDER — SODIUM CHLORIDE 0.9 % IV BOLUS (SEPSIS)
1000.0000 mL | Freq: Once | INTRAVENOUS | Status: AC
  Administered 2016-09-29: 1000 mL via INTRAVENOUS

## 2016-09-29 MED ORDER — ETOMIDATE 2 MG/ML IV SOLN
INTRAVENOUS | Status: AC
  Administered 2016-09-29: 10 mg
  Filled 2016-09-29: qty 10

## 2016-09-29 MED ORDER — ADENOSINE 6 MG/2ML IV SOLN
6.0000 mg | Freq: Once | INTRAVENOUS | Status: AC
  Administered 2016-09-29: 6 mg via INTRAVENOUS

## 2016-09-29 NOTE — ED Triage Notes (Addendum)
Pt arrived via ems from home with complaints of left chest pain that radiates to the jaw. Pt reports "fast heart beat and shortness of breath. Chest pain, and jaw pain. Jjust the usual. ."

## 2016-09-29 NOTE — ED Notes (Signed)
MD at bedside. 
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
## 2016-09-29 NOTE — Sedation Documentation (Signed)
Pt cardioverted at 100J 

## 2017-02-14 DIAGNOSIS — I493 Ventricular premature depolarization: Secondary | ICD-10-CM | POA: Insufficient documentation

## 2017-04-01 DIAGNOSIS — R002 Palpitations: Secondary | ICD-10-CM | POA: Insufficient documentation

## 2017-08-01 ENCOUNTER — Encounter
Admission: RE | Disposition: A | Discharge status: Home / Self Care | Payer: Self-pay | Source: Ambulatory Visit | Attending: Gastroenterology

## 2017-08-01 ENCOUNTER — Ambulatory Visit: Payer: Medicare Other | Admitting: Anesthesiology

## 2017-08-01 ENCOUNTER — Ambulatory Visit
Admission: RE | Admit: 2017-08-01 | Discharge: 2017-08-01 | Disposition: A | Discharge status: Home / Self Care | Payer: Medicare Other | Source: Ambulatory Visit | Attending: Gastroenterology | Admitting: Gastroenterology

## 2017-08-01 DIAGNOSIS — I4891 Unspecified atrial fibrillation: Secondary | ICD-10-CM | POA: Insufficient documentation

## 2017-08-01 DIAGNOSIS — K219 Gastro-esophageal reflux disease without esophagitis: Secondary | ICD-10-CM | POA: Diagnosis not present

## 2017-08-01 DIAGNOSIS — Z7901 Long term (current) use of anticoagulants: Secondary | ICD-10-CM | POA: Diagnosis not present

## 2017-08-01 DIAGNOSIS — K2270 Barrett's esophagus without dysplasia: Secondary | ICD-10-CM | POA: Insufficient documentation

## 2017-08-01 DIAGNOSIS — D120 Benign neoplasm of cecum: Secondary | ICD-10-CM | POA: Insufficient documentation

## 2017-08-01 DIAGNOSIS — Z86711 Personal history of pulmonary embolism: Secondary | ICD-10-CM | POA: Diagnosis not present

## 2017-08-01 DIAGNOSIS — G8929 Other chronic pain: Secondary | ICD-10-CM | POA: Insufficient documentation

## 2017-08-01 DIAGNOSIS — K5730 Diverticulosis of large intestine without perforation or abscess without bleeding: Secondary | ICD-10-CM | POA: Insufficient documentation

## 2017-08-01 DIAGNOSIS — Z980 Intestinal bypass and anastomosis status: Secondary | ICD-10-CM | POA: Insufficient documentation

## 2017-08-01 DIAGNOSIS — Z882 Allergy status to sulfonamides status: Secondary | ICD-10-CM | POA: Insufficient documentation

## 2017-08-01 DIAGNOSIS — Z7984 Long term (current) use of oral hypoglycemic drugs: Secondary | ICD-10-CM | POA: Insufficient documentation

## 2017-08-01 DIAGNOSIS — F329 Major depressive disorder, single episode, unspecified: Secondary | ICD-10-CM | POA: Diagnosis not present

## 2017-08-01 DIAGNOSIS — I10 Essential (primary) hypertension: Secondary | ICD-10-CM | POA: Insufficient documentation

## 2017-08-01 DIAGNOSIS — M549 Dorsalgia, unspecified: Secondary | ICD-10-CM | POA: Insufficient documentation

## 2017-08-01 DIAGNOSIS — R109 Unspecified abdominal pain: Secondary | ICD-10-CM | POA: Diagnosis present

## 2017-08-01 DIAGNOSIS — Z885 Allergy status to narcotic agent status: Secondary | ICD-10-CM | POA: Insufficient documentation

## 2017-08-01 DIAGNOSIS — D124 Benign neoplasm of descending colon: Secondary | ICD-10-CM | POA: Diagnosis not present

## 2017-08-01 DIAGNOSIS — D125 Benign neoplasm of sigmoid colon: Secondary | ICD-10-CM | POA: Insufficient documentation

## 2017-08-01 DIAGNOSIS — K2960 Other gastritis without bleeding: Secondary | ICD-10-CM | POA: Insufficient documentation

## 2017-08-01 DIAGNOSIS — E119 Type 2 diabetes mellitus without complications: Secondary | ICD-10-CM | POA: Insufficient documentation

## 2017-08-01 DIAGNOSIS — Z888 Allergy status to other drugs, medicaments and biological substances status: Secondary | ICD-10-CM | POA: Diagnosis not present

## 2017-08-01 DIAGNOSIS — K635 Polyp of colon: Secondary | ICD-10-CM | POA: Diagnosis not present

## 2017-08-01 DIAGNOSIS — Z79899 Other long term (current) drug therapy: Secondary | ICD-10-CM | POA: Diagnosis not present

## 2017-08-01 DIAGNOSIS — Z85828 Personal history of other malignant neoplasm of skin: Secondary | ICD-10-CM | POA: Insufficient documentation

## 2017-08-01 DIAGNOSIS — E0590 Thyrotoxicosis, unspecified without thyrotoxic crisis or storm: Secondary | ICD-10-CM | POA: Insufficient documentation

## 2017-08-01 HISTORY — DX: Other pulmonary embolism without acute cor pulmonale: I26.99

## 2017-08-01 HISTORY — DX: Depression, unspecified: F32.A

## 2017-08-01 HISTORY — DX: Hypoglycemia, unspecified: E16.2

## 2017-08-01 HISTORY — DX: Dorsalgia, unspecified: M54.9

## 2017-08-01 HISTORY — DX: Irritable bowel syndrome, unspecified: K58.9

## 2017-08-01 HISTORY — DX: Diverticulitis of intestine, part unspecified, without perforation or abscess without bleeding: K57.92

## 2017-08-01 HISTORY — DX: Diverticulosis of intestine, part unspecified, without perforation or abscess without bleeding: K57.90

## 2017-08-01 HISTORY — DX: Other chronic pain: G89.29

## 2017-08-01 HISTORY — DX: Morbid (severe) obesity due to excess calories: E66.01

## 2017-08-01 HISTORY — DX: Personal history of urinary calculi: Z87.442

## 2017-08-01 HISTORY — PX: COLONOSCOPY WITH PROPOFOL: SHX5780

## 2017-08-01 HISTORY — DX: Nontoxic single thyroid nodule: E04.1

## 2017-08-01 HISTORY — DX: Chronic sinusitis, unspecified: J32.9

## 2017-08-01 HISTORY — DX: Barrett's esophagus without dysplasia: K22.70

## 2017-08-01 HISTORY — DX: Thyrotoxicosis, unspecified without thyrotoxic crisis or storm: E05.90

## 2017-08-01 HISTORY — DX: Major depressive disorder, single episode, unspecified: F32.9

## 2017-08-01 HISTORY — PX: ESOPHAGOGASTRODUODENOSCOPY (EGD) WITH PROPOFOL: SHX5813

## 2017-08-01 HISTORY — DX: Malignant (primary) neoplasm, unspecified: C80.1

## 2017-08-01 SURGERY — ESOPHAGOGASTRODUODENOSCOPY (EGD) WITH PROPOFOL
Anesthesia: General

## 2017-08-01 MED ORDER — MIDAZOLAM HCL 2 MG/2ML IJ SOLN
INTRAMUSCULAR | Status: DC | PRN
  Administered 2017-08-01: 2 mg via INTRAVENOUS

## 2017-08-01 MED ORDER — PROPOFOL 500 MG/50ML IV EMUL
INTRAVENOUS | Status: AC
  Filled 2017-08-01: qty 50

## 2017-08-01 MED ORDER — EPHEDRINE SULFATE 50 MG/ML IJ SOLN
INTRAMUSCULAR | Status: AC
  Filled 2017-08-01: qty 1

## 2017-08-01 MED ORDER — FENTANYL CITRATE (PF) 100 MCG/2ML IJ SOLN
INTRAMUSCULAR | Status: DC | PRN
  Administered 2017-08-01: 50 ug via INTRAVENOUS

## 2017-08-01 MED ORDER — PROPOFOL 500 MG/50ML IV EMUL
INTRAVENOUS | Status: DC | PRN
  Administered 2017-08-01: 120 ug/kg/min via INTRAVENOUS

## 2017-08-01 MED ORDER — MIDAZOLAM HCL 2 MG/2ML IJ SOLN
INTRAMUSCULAR | Status: AC
Start: 2017-08-01 — End: 2017-08-01
  Filled 2017-08-01: qty 2

## 2017-08-01 MED ORDER — SODIUM CHLORIDE 0.9 % IV SOLN
INTRAVENOUS | Status: DC
  Administered 2017-08-01: 08:00:00 via INTRAVENOUS

## 2017-08-01 MED ORDER — LIDOCAINE HCL (PF) 2 % IJ SOLN
INTRAMUSCULAR | Status: AC
  Filled 2017-08-01: qty 2

## 2017-08-01 MED ORDER — EPHEDRINE SULFATE 50 MG/ML IJ SOLN
INTRAMUSCULAR | Status: DC | PRN
  Administered 2017-08-01: 10 mg via INTRAVENOUS

## 2017-08-01 MED ORDER — GLYCOPYRROLATE 0.2 MG/ML IJ SOLN
INTRAMUSCULAR | Status: AC
  Filled 2017-08-01: qty 1

## 2017-08-01 MED ORDER — FENTANYL CITRATE (PF) 100 MCG/2ML IJ SOLN
INTRAMUSCULAR | Status: AC
  Filled 2017-08-01: qty 2

## 2017-08-01 MED ORDER — BUTAMBEN-TETRACAINE-BENZOCAINE 2-2-14 % EX AERO
INHALATION_SPRAY | CUTANEOUS | Status: AC
  Filled 2017-08-01: qty 20

## 2017-08-01 MED ORDER — BUTAMBEN-TETRACAINE-BENZOCAINE 2-2-14 % EX AERO
2.0000 | INHALATION_SPRAY | Freq: Once | CUTANEOUS | Status: AC
  Administered 2017-08-01: 08:00:00 via TOPICAL

## 2017-08-01 NOTE — Transfer of Care (Signed)
[facility] Transfer of Care Note  Patient: [name].  Procedure(s) Performed: Procedure(s): ESOPHAGOGASTRODUODENOSCOPY (EGD) WITH PROPOFOL (N/A) COLONOSCOPY WITH PROPOFOL (N/A)  Patient Location: PACU  Anesthesia Type:General  Level of Consciousness: awake and sedated  Airway & Oxygen Therapy: Patient Spontanous Breathing and Patient connected to nasal cannula oxygen  Post-op Assessment: Report given to RN and Post -op Vital signs reviewed and stable  Post vital signs: Reviewed and stable  Last Vitals:  Vitals:   08/01/17 0750  BP: 135/79  Pulse: 90  Resp: 20  Temp: (!) 36.1 C  SpO2: 99%    Last Pain:  Vitals:   08/01/17 0750  TempSrc: Tympanic         Complications: No apparent anesthesia complications

## 2017-08-01 NOTE — H&P (Signed)
Outpatient short stay form Pre-procedure 08/01/2017 7:43 AM [name] MD  Primary Physician: Dr. Crissie Figures  Reason for visit:  EGD and colonoscopy  History of present illness:  Patient is a 66 year old male presenting today as above. He has a history of diverticulitis with multiple surgeries including ostomy and reversal and hernia repair.Marland Kitchen He is also been having problems with reflux. He states he has tried a proton pump inhibitor in the past but this seemed to give him problems with constipation. He currently does take Zantac on an as-needed basis. He has a personal history of Barrett's esophagus. He tolerated his prep well.  We discussed trying to change his treatment of the Barrett's esophagus to include a proton pump inhibitor, lansoprazole, and the regular usage of a low dose of Citrucel for the diarrhea issue. It is of note he does take metformin.  Patient does take eliquis but has held that since Monday. He takes no aspirin products. He does occasionally take an NSAID.    Current Facility-Administered Medications:  .  0.9 %  sodium chloride infusion, , Intravenous, Continuous, [name], MD .  0.9 %  sodium chloride infusion, , Intravenous, Continuous, [name], MD  Prescriptions Prior to Admission  Medication Sig Dispense Refill Last Dose  . diltiazem (CARDIZEM CD) 120 MG 24 hr capsule Take 1 capsule (120 mg total) by mouth daily. 30 capsule 0 08/01/2017 at Unknown time  . diphenhydrAMINE (SOMINEX) 25 MG tablet Take 25 mg by mouth at bedtime as needed for sleep.    07/31/2017 at Unknown time  . fluticasone (FLONASE) 50 MCG/ACT nasal spray Place 1-2 sprays into both nostrils daily.   Past Week at Unknown time  . ibuprofen (ADVIL,MOTRIN) 200 MG tablet Take 400 mg by mouth 2 (two) times daily as needed for mild pain.   Past Week at Unknown time  . naproxen sodium (ANAPROX) 220 MG tablet Take 220 mg by mouth 2 (two) times daily as needed (pain).    Past Week at  Unknown time  . ranitidine (ZANTAC) 150 MG capsule Take 150 mg by mouth daily.   07/30/2017  . vitamin B-12 (CYANOCOBALAMIN) 500 MCG tablet Take 500 mcg by mouth 4 (four) times a week.    Past Week at Unknown time  . amoxicillin (AMOXIL) 500 MG capsule Take 1 capsule (500 mg total) by mouth 3 (three) times daily. 9 capsule 0   . apixaban (ELIQUIS) 5 MG TABS tablet Take 5 mg by mouth 2 (two) times daily.   07/28/2017  . cholecalciferol (VITAMIN D) 1000 UNITS tablet Take 5,000 Units by mouth daily.    07/30/2017  . magnesium gluconate (MAGONATE) 30 MG tablet Take 30 mg by mouth daily.   07/30/2017  . metFORMIN (GLUCOPHAGE-XR) 500 MG 24 hr tablet Take 500 mg by mouth 3 (three) times daily.    07/30/2017  . metoprolol 75 MG TABS Take 75 mg by mouth 2 (two) times daily. (Patient not taking: Reported on 08/01/2017) 60 tablet 0 Not Taking at Unknown time  . Multiple Vitamin (MULTIVITAMIN) tablet Take 1 tablet by mouth daily.   07/30/2017  . Potassium 99 MG TABS Take 99 mg by mouth daily.    07/30/2017  . sotalol (BETAPACE) 120 MG tablet Take 1 tablet (120 mg total) by mouth 2 (two) times daily. (Patient not taking: Reported on 08/01/2017) 60 tablet 0 Completed Course at Unknown time     Allergies  Allergen Reactions  . Amiodarone   . Codeine  Nausea Only and Other (See Comments)    headache  . Dolobid [Diflunisal] Other (See Comments)    Muscle spasms Difficulty breathing  . Sulfa Antibiotics Itching and Rash     Past Medical History:  Diagnosis Date  . Atrial fibrillation ([place])   . Barrett's esophagus   . Cancer ([place])    nonmelanoma skin cancer   . Chronic back pain   . Depression   . Diabetes mellitus without complication ([place])   . Diverticulitis   . Diverticulosis   . GERD (gastroesophageal reflux disease)   . Heart murmur   . History of kidney stones   . Hyperlipidemia   . Hypertension   . Hyperthyroidism   . Hypoglycemia   . Irritable bowel syndrome   . Morbid obesity ([place])   .  Pulmonary embolus [facility])    following [facility] admission July 2013  . Sinusitis   . Thyroid disease   . Thyroid nodule     Review of systems:      Physical Exam    Heart and lungs: Regular rate and rhythm without rub or gallop, lungs are bilaterally clear.    HEENT: Normocephalic atraumatic eyes are anicteric    Other:     Pertinant exam for procedure: Soft mild discomfort in the left upper quadrant palpation. There are no masses or rebound. Bowel sounds are positive normoactive.    Planned proceedures: EGD, colonoscopy and indicated procedures. I have discussed the risks benefits and complications of procedures to include not limited to bleeding, infection, perforation and the risk of sedation and the patient wishes to proceed.    [name], MD Gastroenterology 08/01/2017  7:43 AM

## 2017-08-01 NOTE — Anesthesia Procedure Notes (Signed)
Performed by: COOK-MARTIN, Camylle Whicker Pre-anesthesia Checklist: Patient identified, Emergency Drugs available, Suction available, Patient being monitored and Timeout performed Patient Re-evaluated:Patient Re-evaluated prior to induction Oxygen Delivery Method: Nasal cannula Preoxygenation: Pre-oxygenation with 100% oxygen Induction Type: IV induction Placement Confirmation: positive ETCO2 and CO2 detector       

## 2017-08-01 NOTE — Anesthesia Post-op Follow-up Note (Signed)
Anesthesia QCDR form completed.        

## 2017-08-01 NOTE — Op Note (Signed)
[facility] Gastroenterology Patient Name: [name] Procedure Date: 08/01/2017 8:12 AM MRN: [id number] Account #: [id number] Date of Birth: 03-12-1951 Admit Type: Outpatient Age: 66 Room: [facility] ENDO ROOM 3 Gender: Male Note Status: Finalized Procedure:            Upper GI endoscopy Indications:          Follow-up of Barrett's esophagus Providers:            [name], MD Referring MD:         Adrian Prows (Referring MD) Medicines:            Monitored Anesthesia Care Complications:        No immediate complications. Procedure:            Pre-Anesthesia Assessment:                       - ASA Grade Assessment: III - A patient with severe                        systemic disease.                       After obtaining informed consent, the endoscope was                        passed under direct vision. Throughout the procedure,                        the patient's blood pressure, pulse, and oxygen                        saturations were monitored continuously. The Endoscope                        was introduced through the mouth, and advanced to the                        third part of duodenum. The upper GI endoscopy was                        accomplished without difficulty. The patient tolerated                        the procedure well. The patient tolerated the procedure                        well. Findings:      There were esophageal mucosal changes secondary to established       short-segment Barrett's disease present at the gastroesophageal       junction. The maximum longitudinal extent of these mucosal changes was 1       cm in length. Mucosa was biopsied with a cold forceps for histology in 4       quadrants at 39 cm and 2 directed biopsies at 38 cm from the incisors. A       total of 2 specimen bottles were sent to pathology.      The exam of the esophagus was otherwise normal.      Diffuse and patchy mild inflammation characterized by  congestion       (edema), erosions and erythema was found in  the gastric body and in the       gastric antrum. Biopsies were taken with a cold forceps for histology.       Biopsies were taken with a cold forceps for Helicobacter pylori testing.      The cardia and gastric fundus were normal on retroflexion.      The examined duodenum was normal.      A single 20 mm submucosal papule (nodule) with no bleeding and no       stigmata of recent bleeding was found on the posterior wall of the       gastric antrum. Biopsies were taken with a cold forceps for histology. Impression:           - Esophageal mucosal changes secondary to established                        short-segment Barrett's disease. Biopsied.                       - Erosive gastritis. Biopsied.                       - Normal examined duodenum. Recommendation:       - Await pathology results.                       - Use Prevacid (lansoprazole) 15 mg PO daily daily.                       - Return to GI clinic in 4 weeks.                       - Await pathology results. Procedure Code(s):    --- Professional ---                       [phone], Esophagogastroduodenoscopy, flexible, transoral;                        with biopsy, single or multiple Diagnosis Code(s):    --- Professional ---                       K22.70, Barrett's esophagus without dysplasia                       K29.60, Other gastritis without bleeding CPT copyright 2016 American Medical Association. All rights reserved. The codes documented in this report are preliminary and upon coder review may  be revised to meet current compliance requirements. [name], MD 08/01/2017 8:37:21 AM This report has been signed electronically. Number of Addenda: 0 Note Initiated On: 08/01/2017 8:12 AM      [facility]

## 2017-08-01 NOTE — Anesthesia Postprocedure Evaluation (Signed)
Anesthesia Post Note  Patient: [name].  Procedure(s) Performed: Procedure(s) (LRB): ESOPHAGOGASTRODUODENOSCOPY (EGD) WITH PROPOFOL (N/A) COLONOSCOPY WITH PROPOFOL (N/A)  Patient location during evaluation: Endoscopy Anesthesia Type: General Level of consciousness: awake and alert Pain management: pain level controlled Vital Signs Assessment: post-procedure vital signs reviewed and stable Respiratory status: spontaneous breathing, nonlabored ventilation, respiratory function stable and patient connected to nasal cannula oxygen Cardiovascular status: blood pressure returned to baseline and stable Postop Assessment: no signs of nausea or vomiting Anesthetic complications: no     Last Vitals:  Vitals:   08/01/17 0930 08/01/17 0940  BP: 126/80 127/80  Pulse: 75 73  Resp: (!) 26 (!) 23  Temp:    SpO2: 94% 95%    Last Pain:  Vitals:   08/01/17 0750  TempSrc: Tympanic                 [name] S

## 2017-08-01 NOTE — Anesthesia Preprocedure Evaluation (Addendum)
Anesthesia Evaluation  Patient identified by MRN, date of birth, ID band Patient awake    Reviewed: Allergy & Precautions, NPO status , Patient's Chart, lab work & pertinent test results, reviewed documented beta blocker date and time   Airway Mallampati: III  TM Distance: >3 FB     Dental  (+) Missing   Pulmonary           Cardiovascular hypertension, Pt. on medications      Neuro/Psych PSYCHIATRIC DISORDERS Depression    GI/Hepatic GERD  ,  Endo/Other  diabetes, Type 2  Renal/GU      Musculoskeletal   Abdominal   Peds  Hematology   Anesthesia Other Findings Ablation helped. Occasional PVCs.  Reproductive/Obstetrics                            Anesthesia Physical Anesthesia Plan  ASA: III  Anesthesia Plan: General   Post-op Pain Management:    Induction: Intravenous  PONV Risk Score and Plan:   Airway Management Planned:   Additional Equipment:   Intra-op Plan:   Post-operative Plan:   Informed Consent: I have reviewed the patients History and Physical, chart, labs and discussed the procedure including the risks, benefits and alternatives for the proposed anesthesia with the patient or authorized representative who has indicated his/her understanding and acceptance.     Plan Discussed with: CRNA  Anesthesia Plan Comments:         Anesthesia Quick Evaluation

## 2017-08-01 NOTE — Op Note (Addendum)
[facility] Gastroenterology Patient Name: [name] Procedure Date: 08/01/2017 8:11 AM MRN: [id number] Account #: [id number] Date of Birth: 13-Nov-1951 Admit Type: Outpatient Age: 66 Room: [facility] ENDO ROOM 3 Gender: Male Note Status: Finalized Procedure:            Colonoscopy Indications:          Generalized abdominal pain Providers:            [name], MD Referring MD:         Adrian Prows (Referring MD) Medicines:            Monitored Anesthesia Care Complications:        No immediate complications. Procedure:            Pre-Anesthesia Assessment:                       - ASA Grade Assessment: III - A patient with severe                        systemic disease.                       After obtaining informed consent, the colonoscope was                        passed under direct vision. Throughout the procedure,                        the patient's blood pressure, pulse, and oxygen                        saturations were monitored continuously. The                        Colonoscope was introduced through the anus and                        advanced to the the cecum, identified by appendiceal                        orifice and ileocecal valve. The colonoscopy was                        performed without difficulty. The patient tolerated the                        procedure well. The quality of the bowel preparation                        was good. Findings:      Multiple medium-mouthed diverticula were found in the mid sigmoid colon.      There was evidence of a prior functional end-to-end colo-colonic       anastomosis in the mid sigmoid colon. This was patent and was       characterized by healthy appearing mucosa.      Two sessile polyps were found in the descending colon. The polyps were 3       to 4 mm in size. These polyps were removed with a cold snare. Resection       and retrieval were complete.      A 4 mm polyp  was found in the  cecum. The polyp was sessile. The polyp       was removed with a cold snare. Resection and retrieval were complete.      A 1 mm polyp was found in the cecum. The polyp was sessile. The polyp       was removed with a cold biopsy forceps. Resection and retrieval were       complete.      A 5 mm polyp was found in the proximal sigmoid colon. The polyp was       sessile. The polyp was removed with a cold snare. Resection and       retrieval were complete.      The retroflexed view of the distal rectum and anal verge was normal and       showed no anal or rectal abnormalities.      The digital rectal exam was normal. Impression:           - Diverticulosis in the mid sigmoid colon.                       - Patent functional end-to-end colo-colonic                        anastomosis, characterized by healthy appearing mucosa.                       - Two 3 to 4 mm polyps in the descending colon, removed                        with a cold snare. Resected and retrieved.                       - One 4 mm polyp in the cecum, removed with a cold                        snare. Resected and retrieved.                       - One 1 mm polyp in the cecum, removed with a cold                        biopsy forceps. Resected and retrieved.                       - One 5 mm polyp in the proximal sigmoid colon, removed                        with a cold snare. Resected and retrieved.                       - The distal rectum and anal verge are normal on                        retroflexion view. Recommendation:       - Discharge patient to home. Procedure Code(s):    --- Professional ---                       [phone], Colonoscopy, flexible; with removal of tumor(s),  polyp(s), or other lesion(s) by snare technique                       45380, 59, Colonoscopy, flexible; with biopsy, single                        or multiple Diagnosis Code(s):    --- Professional ---                        Z98.0, Intestinal bypass and anastomosis status                       D12.4, Benign neoplasm of descending colon                       D12.0, Benign neoplasm of cecum                       D12.5, Benign neoplasm of sigmoid colon                       R10.84, Generalized abdominal pain                       K57.30, Diverticulosis of large intestine without                        perforation or abscess without bleeding CPT copyright 2016 American Medical Association. All rights reserved. The codes documented in this report are preliminary and upon coder review may  be revised to meet current compliance requirements. [name], MD 08/01/2017 9:10:47 AM This report has been signed electronically. Number of Addenda: 0 Note Initiated On: 08/01/2017 8:11 AM Scope Withdrawal Time: 0 hours 12 minutes 34 seconds  Total Procedure Duration: 0 hours 24 minutes 26 seconds       [facility]

## 2017-08-01 NOTE — Anesthesia Preprocedure Evaluation (Signed)
Anesthesia Evaluation  Patient identified by MRN, date of birth, ID band Patient awake    Reviewed: Allergy & Precautions, NPO status , Patient's Chart, lab work & pertinent test results, reviewed documented beta blocker date and time   Airway Mallampati: III  TM Distance: >3 FB     Dental  (+) Chipped   Pulmonary           Cardiovascular hypertension, Pt. on medications + dysrhythmias Atrial Fibrillation      Neuro/Psych PSYCHIATRIC DISORDERS Depression    GI/Hepatic GERD  ,  Endo/Other  diabetes, Type 2Hyperthyroidism   Renal/GU      Musculoskeletal   Abdominal   Peds  Hematology   Anesthesia Other Findings AF with ablation.  Reproductive/Obstetrics                             Anesthesia Physical Anesthesia Plan  ASA: III  Anesthesia Plan: General   Post-op Pain Management:    Induction: Intravenous  PONV Risk Score and Plan:   Airway Management Planned:   Additional Equipment:   Intra-op Plan:   Post-operative Plan:   Informed Consent: I have reviewed the patients History and Physical, chart, labs and discussed the procedure including the risks, benefits and alternatives for the proposed anesthesia with the patient or authorized representative who has indicated his/her understanding and acceptance.     Plan Discussed with: CRNA  Anesthesia Plan Comments:         Anesthesia Quick Evaluation

## 2017-08-04 ENCOUNTER — Encounter: Payer: Self-pay | Admitting: Gastroenterology

## 2017-08-05 LAB — SURGICAL PATHOLOGY

## 2017-08-11 DIAGNOSIS — Q8901 Asplenia (congenital): Secondary | ICD-10-CM | POA: Insufficient documentation

## 2018-07-27 ENCOUNTER — Encounter: Payer: Self-pay | Admitting: *Deleted

## 2018-07-28 ENCOUNTER — Encounter
Admission: RE | Disposition: A | Discharge status: Home / Self Care | Payer: Self-pay | Source: Ambulatory Visit | Attending: Gastroenterology

## 2018-07-28 ENCOUNTER — Ambulatory Visit: Payer: Medicare Other | Admitting: Certified Registered"

## 2018-07-28 ENCOUNTER — Ambulatory Visit
Admission: RE | Admit: 2018-07-28 | Discharge: 2018-07-28 | Disposition: A | Discharge status: Home / Self Care | Payer: Medicare Other | Source: Ambulatory Visit | Attending: Gastroenterology | Admitting: Gastroenterology

## 2018-07-28 ENCOUNTER — Encounter: Payer: Self-pay | Admitting: *Deleted

## 2018-07-28 DIAGNOSIS — Z888 Allergy status to other drugs, medicaments and biological substances status: Secondary | ICD-10-CM | POA: Insufficient documentation

## 2018-07-28 DIAGNOSIS — K2270 Barrett's esophagus without dysplasia: Secondary | ICD-10-CM | POA: Insufficient documentation

## 2018-07-28 DIAGNOSIS — Z882 Allergy status to sulfonamides status: Secondary | ICD-10-CM | POA: Diagnosis not present

## 2018-07-28 DIAGNOSIS — F329 Major depressive disorder, single episode, unspecified: Secondary | ICD-10-CM | POA: Insufficient documentation

## 2018-07-28 DIAGNOSIS — R011 Cardiac murmur, unspecified: Secondary | ICD-10-CM | POA: Insufficient documentation

## 2018-07-28 DIAGNOSIS — Z6832 Body mass index (BMI) 32.0-32.9, adult: Secondary | ICD-10-CM | POA: Diagnosis not present

## 2018-07-28 DIAGNOSIS — Z85828 Personal history of other malignant neoplasm of skin: Secondary | ICD-10-CM | POA: Insufficient documentation

## 2018-07-28 DIAGNOSIS — Z87442 Personal history of urinary calculi: Secondary | ICD-10-CM | POA: Diagnosis not present

## 2018-07-28 DIAGNOSIS — K589 Irritable bowel syndrome without diarrhea: Secondary | ICD-10-CM | POA: Insufficient documentation

## 2018-07-28 DIAGNOSIS — Z885 Allergy status to narcotic agent status: Secondary | ICD-10-CM | POA: Insufficient documentation

## 2018-07-28 DIAGNOSIS — K219 Gastro-esophageal reflux disease without esophagitis: Secondary | ICD-10-CM | POA: Insufficient documentation

## 2018-07-28 DIAGNOSIS — Z7901 Long term (current) use of anticoagulants: Secondary | ICD-10-CM | POA: Insufficient documentation

## 2018-07-28 DIAGNOSIS — K3189 Other diseases of stomach and duodenum: Secondary | ICD-10-CM | POA: Diagnosis not present

## 2018-07-28 DIAGNOSIS — Z9049 Acquired absence of other specified parts of digestive tract: Secondary | ICD-10-CM | POA: Insufficient documentation

## 2018-07-28 DIAGNOSIS — K2960 Other gastritis without bleeding: Secondary | ICD-10-CM | POA: Diagnosis not present

## 2018-07-28 DIAGNOSIS — Z86711 Personal history of pulmonary embolism: Secondary | ICD-10-CM | POA: Insufficient documentation

## 2018-07-28 DIAGNOSIS — E0590 Thyrotoxicosis, unspecified without thyrotoxic crisis or storm: Secondary | ICD-10-CM | POA: Insufficient documentation

## 2018-07-28 DIAGNOSIS — M549 Dorsalgia, unspecified: Secondary | ICD-10-CM | POA: Insufficient documentation

## 2018-07-28 DIAGNOSIS — E785 Hyperlipidemia, unspecified: Secondary | ICD-10-CM | POA: Diagnosis not present

## 2018-07-28 DIAGNOSIS — E119 Type 2 diabetes mellitus without complications: Secondary | ICD-10-CM | POA: Diagnosis not present

## 2018-07-28 DIAGNOSIS — K5790 Diverticulosis of intestine, part unspecified, without perforation or abscess without bleeding: Secondary | ICD-10-CM | POA: Diagnosis not present

## 2018-07-28 DIAGNOSIS — I10 Essential (primary) hypertension: Secondary | ICD-10-CM | POA: Insufficient documentation

## 2018-07-28 DIAGNOSIS — I4891 Unspecified atrial fibrillation: Secondary | ICD-10-CM | POA: Insufficient documentation

## 2018-07-28 DIAGNOSIS — K449 Diaphragmatic hernia without obstruction or gangrene: Secondary | ICD-10-CM | POA: Diagnosis not present

## 2018-07-28 DIAGNOSIS — G8929 Other chronic pain: Secondary | ICD-10-CM | POA: Insufficient documentation

## 2018-07-28 DIAGNOSIS — Z79899 Other long term (current) drug therapy: Secondary | ICD-10-CM | POA: Insufficient documentation

## 2018-07-28 HISTORY — DX: Irritable bowel syndrome, unspecified: K58.9

## 2018-07-28 HISTORY — PX: ESOPHAGOGASTRODUODENOSCOPY (EGD) WITH PROPOFOL: SHX5813

## 2018-07-28 LAB — GLUCOSE, CAPILLARY: Glucose-Capillary: 101 mg/dL — ABNORMAL HIGH (ref 70–99)

## 2018-07-28 SURGERY — ESOPHAGOGASTRODUODENOSCOPY (EGD) WITH PROPOFOL
Anesthesia: General

## 2018-07-28 MED ORDER — SODIUM CHLORIDE 0.9 % IV SOLN
INTRAVENOUS | Status: DC
  Administered 2018-07-28: 1000 mL via INTRAVENOUS

## 2018-07-28 MED ORDER — PROPOFOL 10 MG/ML IV BOLUS
INTRAVENOUS | Status: AC
  Filled 2018-07-28: qty 20

## 2018-07-28 MED ORDER — PROPOFOL 500 MG/50ML IV EMUL
INTRAVENOUS | Status: DC | PRN
  Administered 2018-07-28: 100 ug/kg/min via INTRAVENOUS

## 2018-07-28 MED ORDER — GLYCOPYRROLATE 0.2 MG/ML IJ SOLN
INTRAMUSCULAR | Status: DC | PRN
  Administered 2018-07-28: 0.2 mg via INTRAVENOUS

## 2018-07-28 MED ORDER — PROPOFOL 500 MG/50ML IV EMUL
INTRAVENOUS | Status: AC
  Filled 2018-07-28: qty 50

## 2018-07-28 MED ORDER — LIDOCAINE HCL (CARDIAC) PF 100 MG/5ML IV SOSY
PREFILLED_SYRINGE | INTRAVENOUS | Status: DC | PRN
  Administered 2018-07-28: 60 mg via INTRAVENOUS
  Administered 2018-07-28: 40 mg via INTRAVENOUS

## 2018-07-28 MED ORDER — SODIUM CHLORIDE 0.9 % IV SOLN: INTRAVENOUS | Status: DC

## 2018-07-28 MED ORDER — GLYCOPYRROLATE 0.2 MG/ML IJ SOLN
INTRAMUSCULAR | Status: AC
  Filled 2018-07-28: qty 1

## 2018-07-28 MED ORDER — PROPOFOL 10 MG/ML IV BOLUS
INTRAVENOUS | Status: DC | PRN
  Administered 2018-07-28: 150 mg via INTRAVENOUS
  Administered 2018-07-28: 50 mg via INTRAVENOUS

## 2018-07-28 MED ORDER — LIDOCAINE HCL (PF) 2 % IJ SOLN
INTRAMUSCULAR | Status: AC
  Filled 2018-07-28: qty 10

## 2018-07-28 MED ORDER — SODIUM CHLORIDE 0.9 % IV SOLN
INTRAVENOUS | Status: DC | PRN
  Administered 2018-07-28: 11:00:00 via INTRAVENOUS
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
## 2018-07-28 NOTE — Op Note (Signed)
[facility] Gastroenterology Patient Name: [name] Procedure Date: 07/28/2018 10:42 AM MRN: [id number] Account #: [id number] Date of Birth: 05-21-1951 Admit Type: Outpatient Age: 66 Room: [facility] ENDO ROOM 3 Gender: Male Note Status: Finalized Procedure:            Upper GI endoscopy Indications:          Surveillance procedure of gastric submucosal nodule,                        Follow-up of Barrett's esophagus Providers:            [name], MD Referring MD:         Baxter Hire, MD (Referring MD) Medicines:            Monitored Anesthesia Care Complications:        No immediate complications. Procedure:            Pre-Anesthesia Assessment:                       - ASA Grade Assessment: III - A patient with severe                        systemic disease.                       After obtaining informed consent, the endoscope was                        passed under direct vision. Throughout the procedure,                        the patient's blood pressure, pulse, and oxygen                        saturations were monitored continuously. The Endoscope                        was introduced through the mouth, and advanced to the                        third part of duodenum. The upper GI endoscopy was                        accomplished without difficulty. The patient tolerated                        the procedure well. Findings:      There were esophageal mucosal changes consistent with short-segment       Barrett's esophagus present in the lower third of the esophagus. The       maximum longitudinal extent of these mucosal changes was 2 cm in length.       Mucosa was biopsied with a cold forceps for histology in a targeted       manner and in 4 quadrants at intervals of 1 cm at 38 and 39 cm from the       incisors. A total of 2 specimen bottles were sent to pathology.      The exam of the esophagus was otherwise normal. GE junction is patulous.    A single 18 mm submucosal papule (nodule) with no bleeding and  no       stigmata of recent bleeding was found on the posterior wall of the       gastric antrum. Biopsies were taken with a cold forceps for histology.       Lesion is soft with a positive pillow sign.      Patchy minimal inflammation characterized by erythema was found in the       gastric body. Biopsies were taken with a cold forceps for histology.       Biopsies were taken with a cold forceps for Helicobacter pylori testing.      The examined duodenum was normal. Impression:           - Esophageal mucosal changes consistent with                        short-segment Barrett's esophagus. Biopsied.                       - A single submucosal papule (nodule) found in the                        stomach. Biopsied.                       - Bile gastritis. Biopsied.                       - Normal examined duodenum. Recommendation:       - Use Prevacid (lansoprazole) 30 mg PO daily daily. Procedure Code(s):    --- Professional ---                       [phone], Esophagogastroduodenoscopy, flexible, transoral;                        with biopsy, single or multiple Diagnosis Code(s):    --- Professional ---                       K22.70, Barrett's esophagus without dysplasia                       K31.89, Other diseases of stomach and duodenum                       K29.60, Other gastritis without bleeding CPT copyright 2017 American Medical Association. All rights reserved. The codes documented in this report are preliminary and upon coder review may  be revised to meet current compliance requirements. [name], MD 07/28/2018 11:10:40 AM This report has been signed electronically. Number of Addenda: 0 Note Initiated On: 07/28/2018 10:42 AM      [facility]

## 2018-07-28 NOTE — Anesthesia Postprocedure Evaluation (Signed)
Anesthesia Post Note  Patient: [name].  Procedure(s) Performed: ESOPHAGOGASTRODUODENOSCOPY (EGD) WITH PROPOFOL (N/A )  Patient location during evaluation: Endoscopy Anesthesia Type: General Level of consciousness: awake and alert, oriented and patient cooperative Pain management: satisfactory to patient Vital Signs Assessment: post-procedure vital signs reviewed and stable Respiratory status: spontaneous breathing and respiratory function stable Cardiovascular status: blood pressure returned to baseline and stable Postop Assessment: no headache, no backache, patient able to bend at knees, no apparent nausea or vomiting, adequate PO intake and able to ambulate Anesthetic complications: no     Last Vitals:  Vitals:   07/28/18 1013 07/28/18 1107  BP: (!) 152/93 123/90  Pulse: 80 87  Resp: 18 (!) 24  Temp: 36.5 C (!) 36.1 C  SpO2: 97% 92%    Last Pain:  Vitals:   07/28/18 1107  TempSrc: Tympanic  PainSc: 0-No pain                 Win Guajardo H Felton Buczynski

## 2018-07-28 NOTE — Anesthesia Post-op Follow-up Note (Signed)
Anesthesia QCDR form completed.        

## 2018-07-28 NOTE — H&P (Signed)
Outpatient short stay form Pre-procedure 07/28/2018 10:30 AM Keith Sails MD  Primary Physician: Harrel Lemon, MD  Reason for visit: Patient is a 67 year old male presenting today for an EGD.  History of present illness: Patient has a personal history of Barrett's esophagus without dysplasia.  He does take Prevacid probably 15 mg daily.  Have some breakthrough heartburn occurring at night with this dose.  His last EGD was about a year ago and he was shown to have short segment Barrett's esophagus  Patient does take Eliquis for atrial fibrillation and has held that for 3 days.  Takes no other blood thinning agent.    Current Facility-Administered Medications:  .  0.9 %  sodium chloride infusion, , Intravenous, Continuous, Keith Sails, MD .  0.9 %  sodium chloride infusion, , Intravenous, Continuous, Keith Sails, MD, Last Rate: 20 mL/hr at 07/28/18 1030, 1,000 mL at 07/28/18 1030  Medications Prior to Admission  Medication Sig Dispense Refill Last Dose  . apixaban (ELIQUIS) 5 MG TABS tablet Take 5 mg by mouth 2 (two) times daily.   Past Week at Unknown time  . cholecalciferol (VITAMIN D) 1000 UNITS tablet Take 5,000 Units by mouth daily.    Past Week at Unknown time  . diltiazem (CARDIZEM CD) 120 MG 24 hr capsule Take 1 capsule (120 mg total) by mouth daily. 30 capsule 0 07/28/2018 at 0600  . fluticasone (FLONASE) 50 MCG/ACT nasal spray Place 1-2 sprays into both nostrils daily.   07/28/2018 at 0600  . lansoprazole (PREVACID) 15 MG capsule Take 15 mg by mouth daily at 12 noon.   Past Week at Unknown time  . magnesium gluconate (MAGONATE) 30 MG tablet Take 30 mg by mouth daily.   Past Week at Unknown time  . metFORMIN (GLUCOPHAGE-XR) 500 MG 24 hr tablet Take 500 mg by mouth 3 (three) times daily.    07/27/2018 at Unknown time  . Multiple Vitamin (MULTIVITAMIN) tablet Take 1 tablet by mouth daily.   Past Week at Unknown time  . naproxen sodium (ANAPROX) 220 MG tablet Take 220  mg by mouth 2 (two) times daily as needed (pain).    Past Week at Unknown time  . Potassium 99 MG TABS Take 99 mg by mouth daily.    Past Week at Unknown time  . Povidone-Iodine (IODINE SOLUTION EX) Apply 150 mcg topically daily.   Past Week at Unknown time  . ranitidine (ZANTAC) 150 MG capsule Take 150 mg by mouth daily.   Past Week at Unknown time  . vitamin B-12 (CYANOCOBALAMIN) 500 MCG tablet Take 500 mcg by mouth 4 (four) times a week.    Past Week at Unknown time  . amitriptyline (ELAVIL) 25 MG tablet Take 25 mg by mouth at bedtime.   Not Taking at Unknown time  . amoxicillin (AMOXIL) 500 MG capsule Take 1 capsule (500 mg total) by mouth 3 (three) times daily. (Patient not taking: Reported on 07/28/2018) 9 capsule 0 Completed Course at Unknown time  . diphenhydrAMINE (SOMINEX) 25 MG tablet Take 25 mg by mouth at bedtime as needed for sleep.    07/31/2017 at Unknown time  . ibuprofen (ADVIL,MOTRIN) 200 MG tablet Take 400 mg by mouth 2 (two) times daily as needed for mild pain.   Past Week at Unknown time  . metoprolol 75 MG TABS Take 75 mg by mouth 2 (two) times daily. (Patient not taking: Reported on 08/01/2017) 60 tablet 0 Not Taking at Unknown time  . sotalol (  BETAPACE) 120 MG tablet Take 1 tablet (120 mg total) by mouth 2 (two) times daily. (Patient not taking: Reported on 08/01/2017) 60 tablet 0 Not Taking at Unknown time     Allergies  Allergen Reactions  . Amiodarone   . Codeine Nausea Only and Other (See Comments)    headache  . Dolobid [Diflunisal] Other (See Comments)    Muscle spasms Difficulty breathing  . Sulfa Antibiotics Itching and Rash     Past Medical History:  Diagnosis Date  . Atrial fibrillation ([place])   . Barrett's esophagus   . Cancer ([place])    nonmelanoma skin cancer   . Chronic back pain   . Depression   . Diabetes mellitus without complication ([place])   . Diverticulitis   . Diverticulosis   . GERD (gastroesophageal reflux disease)   . Heart murmur   .  History of kidney stones   . Hyperlipidemia   . Hypertension   . Hyperthyroidism   . Hypoglycemia   . IBS (irritable bowel syndrome)   . Irritable bowel syndrome   . Morbid obesity ([place])   . Pulmonary embolus [facility])    following [facility] admission July 2013  . Sinusitis   . Thyroid disease   . Thyroid nodule     Review of systems:      Physical Exam    Heart and lungs: Regular rate and rhythm without rub or gallop, lungs are bilaterally clear.    HEENT: Normocephalic atraumatic eyes are anicteric    Other:    Pertinant exam for procedure: Soft mild generalized discomfort no masses or rebound bowel sounds are positive normoactive.    Planned proceedures: EGD and indicated procedures. I have discussed the risks benefits and complications of procedures to include not limited to bleeding, infection, perforation and the risk of sedation and the patient wishes to proceed.    Keith Sails, MD Gastroenterology 07/28/2018  10:30 AM

## 2018-07-28 NOTE — Transfer of Care (Signed)
Immediate Anesthesia Transfer of Care Note  Patient: [name].  Procedure(s) Performed: ESOPHAGOGASTRODUODENOSCOPY (EGD) WITH PROPOFOL (N/A )  Patient Location: PACU and Endoscopy Unit  Anesthesia Type:General  Level of Consciousness: awake, alert , oriented and patient cooperative  Airway & Oxygen Therapy: Patient Spontanous Breathing  Post-op Assessment: Report given to RN, Post -op Vital signs reviewed and stable and Patient moving all extremities  Post vital signs: Reviewed and stable  Last Vitals:  Vitals Value Taken Time  BP 123/90 07/28/2018 11:07 AM  Temp 36.1 C 07/28/2018 11:07 AM  Pulse 86 07/28/2018 11:11 AM  Resp 25 07/28/2018 11:11 AM  SpO2 92 % 07/28/2018 11:11 AM  Vitals shown include unvalidated device data.  Last Pain:  Vitals:   07/28/18 1107  TempSrc: Tympanic  PainSc: 0-No pain         Complications: No apparent anesthesia complications

## 2018-07-30 LAB — SURGICAL PATHOLOGY

## 2018-08-03 ENCOUNTER — Encounter: Payer: Self-pay | Admitting: Gastroenterology

## 2018-09-18 ENCOUNTER — Telehealth: Payer: Self-pay

## 2018-09-18 NOTE — Telephone Encounter (Signed)
Received referral from [place] November, NP for EUS to evaluate gastric submucosal papule. Called and spoke with Mr. Ryland. EUS scheduled for 10/29/18 with Dr. Francella Solian at [facility]. Went over instructions and copy mailed to home address. We will obtain permission to hold Eliquis from Dr. Saralyn Pilar. INSTRUCTIONS FOR ENDOSCOPIC ULTRASOUND -Your procedure has been scheduled for November 14th with Dr. Francella Solian [facility]. -The hospital may contact you to pre-register over the phone.  -To get your scheduled arrival time, please call the Endoscopy unit at  [phone] between 1-3 p.m. on:  November 13th   -ON THE DAY OF YOU PROCEDURE:   1. If you are scheduled for a morning procedure, nothing to drink after midnight  -If you are scheduled for an afternoon procedure, you may have clear liquids until 5 hours prior  to the procedure but no carbonated drinks or broth  2. NO FOOD THE DAY OF YOUR PROCEDURE  3. You may take your heart, seizure, blood pressure, Parkinson's or breathing medications at  6am with just enough water to get your pills down  4. Do not take any oral Diabetic medications the morning of your procedure.  5. If you are a diabetic and are using insulin, please notify your prescribing physician of this  procedure, as your dose may need to be altered related to not being able to eat or drink.   6. Do not take vitamins, iron, or fish oil for 5 days before your procedure     -On the day of your procedure, come to the [facility] Admitting/Registration desk (First desk on the right) at the scheduled arrival time. You MUST have someone drive you home from your procedure. You must have a responsible adult with a valid driver's license who is on site throughout your entire procedure and who can stay with you for several hours after your procedure. You may not go home alone in a taxi, shuttle [place] or bus, as the drivers will not be responsible for you.  --If you have any  questions please call me at the above contact  Oncology Nurse Navigator Documentation  Navigator Location: [facility] Onc (09/18/18 1400)   )Navigator Encounter Type: Telephone (09/18/18 1400) Telephone: [place] Call (09/18/18 1400)                       Barriers/Navigation Needs: Coordination of Care (09/18/18 1400)   Interventions: Coordination of Care (09/18/18 1400)   Coordination of Care: EUS (09/18/18 1400)                  Time Spent with Patient: 30 (09/18/18 1400)

## 2018-09-28 ENCOUNTER — Telehealth: Payer: Self-pay

## 2018-09-28 NOTE — Telephone Encounter (Signed)
Received permission from Dr. Saralyn Pilar to hold Eliquis 3 days prior to EUS. Called and instructed [name] to take Eliquis on 11/10 then stop until EUS on 11/14. He will be instructed following EUS when to resume. Oncology Nurse Navigator Documentation  Navigator Location: [facility] (09/28/18 1300)   )Navigator Encounter Type: Telephone (09/28/18 1300) Telephone: [name] (09/28/18 1300)                               Coordination of Care: EUS (09/28/18 1300)                  Time Spent with Patient: 15 (09/28/18 1300)

## 2018-10-28 ENCOUNTER — Encounter: Payer: Self-pay | Admitting: *Deleted

## 2018-10-28 MED ORDER — LIDOCAINE HCL (PF) 2 % IJ SOLN
INTRAMUSCULAR | Status: AC
  Filled 2018-10-28: qty 10

## 2018-10-28 MED ORDER — GLYCOPYRROLATE 0.2 MG/ML IJ SOLN
INTRAMUSCULAR | Status: AC
  Filled 2018-10-28: qty 1

## 2018-10-28 MED ORDER — SUCCINYLCHOLINE CHLORIDE 20 MG/ML IJ SOLN
INTRAMUSCULAR | Status: AC
  Filled 2018-10-28: qty 1

## 2018-10-28 MED ORDER — PROPOFOL 500 MG/50ML IV EMUL
INTRAVENOUS | Status: AC
  Filled 2018-10-28: qty 50

## 2018-10-29 ENCOUNTER — Ambulatory Visit
Admission: RE | Admit: 2018-10-29 | Discharge: 2018-10-29 | Disposition: A | Discharge status: Home / Self Care | Payer: Medicare Other | Source: Ambulatory Visit | Attending: Gastroenterology | Admitting: Gastroenterology

## 2018-10-29 ENCOUNTER — Ambulatory Visit: Payer: Medicare Other | Admitting: Anesthesiology

## 2018-10-29 ENCOUNTER — Encounter: Payer: Self-pay | Admitting: Anesthesiology

## 2018-10-29 ENCOUNTER — Encounter
Admission: RE | Disposition: A | Discharge status: Home / Self Care | Payer: Self-pay | Source: Ambulatory Visit | Attending: Gastroenterology

## 2018-10-29 ENCOUNTER — Other Ambulatory Visit: Payer: Self-pay

## 2018-10-29 DIAGNOSIS — K219 Gastro-esophageal reflux disease without esophagitis: Secondary | ICD-10-CM | POA: Insufficient documentation

## 2018-10-29 DIAGNOSIS — Z7901 Long term (current) use of anticoagulants: Secondary | ICD-10-CM | POA: Diagnosis not present

## 2018-10-29 DIAGNOSIS — F329 Major depressive disorder, single episode, unspecified: Secondary | ICD-10-CM | POA: Insufficient documentation

## 2018-10-29 DIAGNOSIS — Z87442 Personal history of urinary calculi: Secondary | ICD-10-CM | POA: Insufficient documentation

## 2018-10-29 DIAGNOSIS — Z86711 Personal history of pulmonary embolism: Secondary | ICD-10-CM | POA: Diagnosis not present

## 2018-10-29 DIAGNOSIS — K589 Irritable bowel syndrome without diarrhea: Secondary | ICD-10-CM | POA: Diagnosis not present

## 2018-10-29 DIAGNOSIS — K2270 Barrett's esophagus without dysplasia: Secondary | ICD-10-CM | POA: Insufficient documentation

## 2018-10-29 DIAGNOSIS — E119 Type 2 diabetes mellitus without complications: Secondary | ICD-10-CM | POA: Insufficient documentation

## 2018-10-29 DIAGNOSIS — E0590 Thyrotoxicosis, unspecified without thyrotoxic crisis or storm: Secondary | ICD-10-CM | POA: Diagnosis not present

## 2018-10-29 DIAGNOSIS — Z79899 Other long term (current) drug therapy: Secondary | ICD-10-CM | POA: Diagnosis not present

## 2018-10-29 DIAGNOSIS — Z7984 Long term (current) use of oral hypoglycemic drugs: Secondary | ICD-10-CM | POA: Diagnosis not present

## 2018-10-29 DIAGNOSIS — Z85828 Personal history of other malignant neoplasm of skin: Secondary | ICD-10-CM | POA: Diagnosis not present

## 2018-10-29 DIAGNOSIS — I4891 Unspecified atrial fibrillation: Secondary | ICD-10-CM | POA: Diagnosis not present

## 2018-10-29 DIAGNOSIS — Z882 Allergy status to sulfonamides status: Secondary | ICD-10-CM | POA: Diagnosis not present

## 2018-10-29 DIAGNOSIS — I10 Essential (primary) hypertension: Secondary | ICD-10-CM | POA: Diagnosis not present

## 2018-10-29 DIAGNOSIS — E785 Hyperlipidemia, unspecified: Secondary | ICD-10-CM | POA: Diagnosis not present

## 2018-10-29 DIAGNOSIS — Z791 Long term (current) use of non-steroidal anti-inflammatories (NSAID): Secondary | ICD-10-CM | POA: Insufficient documentation

## 2018-10-29 HISTORY — PX: EUS: SHX5427

## 2018-10-29 LAB — GLUCOSE, CAPILLARY: Glucose-Capillary: 90 mg/dL (ref 70–99)

## 2018-10-29 SURGERY — UPPER ENDOSCOPIC ULTRASOUND (EUS) RADIAL
Anesthesia: General

## 2018-10-29 MED ORDER — MIDAZOLAM HCL 2 MG/2ML IJ SOLN
INTRAMUSCULAR | Status: AC
  Filled 2018-10-29: qty 2

## 2018-10-29 MED ORDER — LIDOCAINE HCL (PF) 2 % IJ SOLN
INTRAMUSCULAR | Status: AC
  Filled 2018-10-29: qty 10

## 2018-10-29 MED ORDER — FENTANYL CITRATE (PF) 100 MCG/2ML IJ SOLN
INTRAMUSCULAR | Status: AC
  Filled 2018-10-29: qty 2

## 2018-10-29 MED ORDER — PROPOFOL 500 MG/50ML IV EMUL
INTRAVENOUS | Status: AC
  Filled 2018-10-29: qty 50

## 2018-10-29 MED ORDER — LIDOCAINE HCL (CARDIAC) PF 100 MG/5ML IV SOSY
PREFILLED_SYRINGE | INTRAVENOUS | Status: DC | PRN
  Administered 2018-10-29: 50 mg via INTRAVENOUS

## 2018-10-29 MED ORDER — MIDAZOLAM HCL 2 MG/2ML IJ SOLN
INTRAMUSCULAR | Status: DC | PRN
  Administered 2018-10-29: 2 mg via INTRAVENOUS

## 2018-10-29 MED ORDER — PROPOFOL 500 MG/50ML IV EMUL
INTRAVENOUS | Status: DC | PRN
  Administered 2018-10-29: 140 ug/kg/min via INTRAVENOUS

## 2018-10-29 MED ORDER — SODIUM CHLORIDE 0.9 % IV SOLN
INTRAVENOUS | Status: DC
  Administered 2018-10-29: 1000 mL via INTRAVENOUS

## 2018-10-29 MED ORDER — PROPOFOL 10 MG/ML IV BOLUS
INTRAVENOUS | Status: DC | PRN
  Administered 2018-10-29: 90 mg via INTRAVENOUS

## 2018-10-29 MED ORDER — FENTANYL CITRATE (PF) 100 MCG/2ML IJ SOLN
INTRAMUSCULAR | Status: DC | PRN
  Administered 2018-10-29: 50 ug via INTRAVENOUS

## 2018-10-29 NOTE — Anesthesia Post-op Follow-up Note (Signed)
Anesthesia QCDR form completed.        

## 2018-10-29 NOTE — Transfer of Care (Signed)
Immediate Anesthesia Transfer of Care Note  Patient: [name].  Procedure(s) Performed: UPPER ENDOSCOPIC ULTRASOUND (EUS) RADIAL (N/A )  Patient Location: PACU  Anesthesia Type:General  Level of Consciousness: drowsy  Airway & Oxygen Therapy: Patient Spontanous Breathing and Patient connected to nasal cannula oxygen  Post-op Assessment: Report given to RN and Post -op Vital signs reviewed and stable  Post vital signs: Reviewed and stable  Last Vitals:  Vitals Value Taken Time  BP    Temp    Pulse 74 10/29/2018  2:24 PM  Resp 25 10/29/2018  2:24 PM  SpO2 97 % 10/29/2018  2:24 PM  Vitals shown include unvalidated device data.  Last Pain:  Vitals:   10/29/18 1319  TempSrc: Tympanic  PainSc: 0-No pain         Complications: No apparent anesthesia complications

## 2018-10-29 NOTE — H&P (Signed)
Outpatient short stay form Pre-procedure 10/29/2018 1:39 PM [name],  Keith Hines, MD  Primary Physician: Baxter Hire, MD   Reason for visit:  EUS to evaluate sm lesion in stomach   History of present illness:  Aymptomatic incidental finding of a submucosal lesion in stomach. Negative bx.    Current Facility-Administered Medications:  .  0.9 %  sodium chloride infusion, , Intravenous, Continuous, Carleton Vanvalkenburgh, MD, Last Rate: 20 mL/hr at 10/29/18 1337, 1,000 mL at 10/29/18 1337  Medications Prior to Admission  Medication Sig Dispense Refill Last Dose  . diltiazem (CARDIZEM CD) 120 MG 24 hr capsule Take 1 capsule (120 mg total) by mouth daily. 30 capsule 0 10/29/2018 at 0700  . diphenhydrAMINE (SOMINEX) 25 MG tablet Take 25 mg by mouth at bedtime as needed for sleep.    Past Month at Unknown time  . fluticasone (FLONASE) 50 MCG/ACT nasal spray Place 1-2 sprays into both nostrils daily.   10/29/2018 at Unknown time  . lansoprazole (PREVACID) 15 MG capsule Take 15 mg by mouth daily at 12 noon.   10/29/2018 at 0700  . magnesium gluconate (MAGONATE) 30 MG tablet Take 30 mg by mouth daily.   Past Week at Unknown time  . naproxen sodium (ANAPROX) 220 MG tablet Take 220 mg by mouth 2 (two) times daily as needed (pain).    Past Month at Unknown time  . Potassium 99 MG TABS Take 99 mg by mouth daily.    10/28/2018 at Unknown time  . vitamin B-12 (CYANOCOBALAMIN) 500 MCG tablet Take 500 mcg by mouth 4 (four) times a week.    Past Month at Unknown time  . amitriptyline (ELAVIL) 25 MG tablet Take 25 mg by mouth at bedtime.   Not Taking at Unknown time  . amoxicillin (AMOXIL) 500 MG capsule Take 1 capsule (500 mg total) by mouth 3 (three) times daily. (Patient not taking: Reported on 07/28/2018) 9 capsule 0 Not Taking at Unknown time  . apixaban (ELIQUIS) 5 MG TABS tablet Take 5 mg by mouth 2 (two) times daily.   10/25/2018  . cholecalciferol (VITAMIN D) 1000 UNITS tablet Take 5,000 Units by mouth daily.     Past Week at Unknown time  . ibuprofen (ADVIL,MOTRIN) 200 MG tablet Take 400 mg by mouth 2 (two) times daily as needed for mild pain.   10/27/2018  . metFORMIN (GLUCOPHAGE-XR) 500 MG 24 hr tablet Take 500 mg by mouth 3 (three) times daily.    10/27/2018  . metoprolol 75 MG TABS Take 75 mg by mouth 2 (two) times daily. (Patient not taking: Reported on 08/01/2017) 60 tablet 0 Not Taking at Unknown time  . Multiple Vitamin (MULTIVITAMIN) tablet Take 1 tablet by mouth daily.   Past Week at Unknown time  . Povidone-Iodine (IODINE SOLUTION EX) Apply 150 mcg topically daily.   Past Week at Unknown time  . ranitidine (ZANTAC) 150 MG capsule Take 150 mg by mouth daily.   Not Taking at Unknown time  . sotalol (BETAPACE) 120 MG tablet Take 1 tablet (120 mg total) by mouth 2 (two) times daily. (Patient not taking: Reported on 08/01/2017) 60 tablet 0 Not Taking at Unknown time    Allergies  Allergen Reactions  . Amiodarone   . Codeine Nausea Only and Other (See Comments)    headache  . Dolobid [Diflunisal] Other (See Comments)    Muscle spasms Difficulty breathing  . Sulfa Antibiotics Itching and Rash    Past Medical History:  Diagnosis Date  .  Atrial fibrillation ([place])   . Barrett's esophagus   . Cancer ([place])    nonmelanoma skin cancer   . Chronic back pain   . Depression   . Diabetes mellitus without complication ([place])   . Diverticulitis   . Diverticulosis   . GERD (gastroesophageal reflux disease)   . Heart murmur   . History of kidney stones   . Hyperlipidemia   . Hypertension   . Hyperthyroidism   . Hypoglycemia   . IBS (irritable bowel syndrome)   . Irritable bowel syndrome   . Morbid obesity ([place])   . Pulmonary embolus [facility])    following [facility] admission July 2013  . Sinusitis   . Thyroid disease   . Thyroid nodule     Review of systems:      Physical Exam      Pertinant exam for procedure: Abd soft NT    Planned proceedures:  EUS     Zada Girt  MD Gastroenterology 10/29/2018  1:39 PM

## 2018-10-29 NOTE — Op Note (Signed)
[facility] Patient Name: [name] Procedure Date: 10/29/2018 1:52 PM MRN: [id number] Account #: [id number] Date of Birth: [birth year] Admit Type: Outpatient Age: 68 Room: [facility] ENDO ROOM 3 Gender: Male Note Status: Finalized Procedure:            Upper EUS Indications:          Gastric deformity on endoscopy/Subepithelial tumor                        versus extrinsic compression Providers:            Zada Girt Referring MD:         Baxter Hire, MD (Referring MD), [name], MD (Referring MD) Medicines:            Monitored Anesthesia Care Complications:        No immediate complications. Procedure:            Pre-Anesthesia Assessment:                       - Please see pre-anesthesia assessment documentation                        already completed in Epic.                       After obtaining informed consent, the endoscope was                        passed under direct vision. Throughout the procedure,                        the patient's blood pressure, pulse, and oxygen                        saturations were monitored continuously. The Endoscope                        was introduced through the mouth, and advanced to the                        duodenum for ultrasound examination from the stomach                        and duodenum. The upper endoscope was introduced                        through the mouth, and advanced to the second part of                        duodenum. The upper EUS was accomplished without                        difficulty. The patient tolerated the procedure well. Findings:      ENDOSCOPIC FINDING: :      Barrett's esophagus was present in the lower third of the esophagus.      A medium-sized, submucosal, non-circumferential mass with no bleeding       and no stigmata of recent  bleeding was found on the posterior wall of       the gastric antrum.      The examined  duodenum was endoscopically normal.      ENDOSONOGRAPHIC FINDING: :      An oval intramural (subepithelial) lesion was found in the antrum of the       stomach. The lesion was hypoechoic and heterogenous. Sonographically,       the lesion appeared to originate from the submucosa (Layer 3). The       lesion measured 8 mm (in maximum thickness). The lesion also measured 4       mm in diameter. The outer endosonographic borders were well defined. An       intact interface was seen between the mass and the adjacent structures       suggesting a lack of invasion. Impression:           - Barrett's esophagus. Recently biopsied.                       - Gastric lesion on the posterior wall of the gastric                        antrum.                       - Normal examined duodenum.                       EUS:                       - An intramural (subepithelial) lesion was found in the                        antrum of the stomach. The lesion appeared to originate                        from within the submucosa (Layer 3). The echotexture is                        not typical for lipoma although this is still in the                        differential diagnosis. Other intramural lesions are                        possible including spindle lesions and pancreatic rest                        amongst others.                       - No specimens collected. Recommendation:       - Patient has a contact number available for                        emergencies. The signs and symptoms of potential                        delayed complications were discussed with the patient.  Return to normal activities tomorrow. Written discharge                        instructions were provided to the patient.                       - Return to referring physician.                       - Follow up EUS in one year (this lesion appeared by                        report similar in size to endoscopy  from 2018). It                        attempt at EMR for diagnostic sample is also desired at                        time of follow up EUS in one year, should be referred                        at that time for consultation for EMR.                       - The findings and recommendations were discussed with                        the patient and their spouse. Procedure Code(s):    --- Professional ---                       [phone], Esophagogastroduodenoscopy, flexible, transoral;                        with endoscopic ultrasound examination limited to the                        esophagus, stomach or duodenum, and adjacent structures CPT copyright 2018 American Medical Association. All rights reserved. The codes documented in this report are preliminary and upon coder review may  be revised to meet current compliance requirements. Attending Participation:      I personally performed the entire procedure. Zada Girt,  10/29/2018 2:50:37 PM This report has been signed electronically. Number of Addenda: 0 Note Initiated On: 10/29/2018 1:52 PM      [facility]

## 2018-10-29 NOTE — Anesthesia Preprocedure Evaluation (Signed)
Anesthesia Evaluation  Patient identified by MRN, date of birth, ID band Patient awake    Reviewed: Allergy & Precautions, NPO status , Patient's Chart, lab work & pertinent test results, reviewed documented beta blocker date and time   Airway Mallampati: III  TM Distance: >3 FB     Dental  (+) Chipped   Pulmonary           Cardiovascular hypertension, Pt. on medications and Pt. on home beta blockers + dysrhythmias Atrial Fibrillation + Valvular Problems/Murmurs      Neuro/Psych PSYCHIATRIC DISORDERS Depression    GI/Hepatic GERD  ,  Endo/Other  diabetes, Type 2Hyperthyroidism   Renal/GU      Musculoskeletal   Abdominal   Peds  Hematology   Anesthesia Other Findings Obese. Hx PE. Hx SVT. EF 55-60  Reproductive/Obstetrics                             Anesthesia Physical Anesthesia Plan  ASA: III  Anesthesia Plan: General   Post-op Pain Management:    Induction: Intravenous  PONV Risk Score and Plan:   Airway Management Planned:   Additional Equipment:   Intra-op Plan:   Post-operative Plan:   Informed Consent: I have reviewed the patients History and Physical, chart, labs and discussed the procedure including the risks, benefits and alternatives for the proposed anesthesia with the patient or authorized representative who has indicated his/her understanding and acceptance.     Plan Discussed with: CRNA  Anesthesia Plan Comments:         Anesthesia Quick Evaluation

## 2018-10-30 ENCOUNTER — Encounter: Payer: Self-pay | Admitting: Gastroenterology

## 2018-10-30 NOTE — Anesthesia Postprocedure Evaluation (Signed)
Anesthesia Post Note  Patient: [name].  Procedure(s) Performed: UPPER ENDOSCOPIC ULTRASOUND (EUS) RADIAL (N/A )  Patient location during evaluation: PACU Anesthesia Type: General Level of consciousness: awake and alert Pain management: pain level controlled Vital Signs Assessment: post-procedure vital signs reviewed and stable Respiratory status: spontaneous breathing, nonlabored ventilation and respiratory function stable Cardiovascular status: blood pressure returned to baseline and stable Postop Assessment: no apparent nausea or vomiting Anesthetic complications: no     Last Vitals:  Vitals:   10/29/18 1319 10/29/18 1425  BP: (!) 172/92 (!) 134/93  Pulse: 74   Resp: 16   Temp: (!) 36.1 C (!) 36.2 C  SpO2: 100%     Last Pain:  Vitals:   10/29/18 1445  TempSrc:   PainSc: 0-No pain                 Durenda Hurt

## 2018-10-30 NOTE — Progress Notes (Signed)
EUS report routed to ordering provider, C. London Np

## 2019-01-07 DIAGNOSIS — F32A Depression, unspecified: Secondary | ICD-10-CM | POA: Insufficient documentation

## 2019-01-07 DIAGNOSIS — G4700 Insomnia, unspecified: Secondary | ICD-10-CM | POA: Insufficient documentation

## 2019-05-25 ENCOUNTER — Ambulatory Visit: Payer: Self-pay | Admitting: Surgery
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
## 2019-06-02 ENCOUNTER — Other Ambulatory Visit: Payer: Self-pay

## 2019-06-02 ENCOUNTER — Encounter
Admission: RE | Admit: 2019-06-02 | Discharge: 2019-06-02 | Disposition: A | Discharge status: Home / Self Care | Payer: Medicare Other | Source: Ambulatory Visit | Attending: Surgery | Admitting: Surgery

## 2019-06-02 DIAGNOSIS — I4891 Unspecified atrial fibrillation: Secondary | ICD-10-CM | POA: Insufficient documentation

## 2019-06-02 DIAGNOSIS — Z01818 Encounter for other preprocedural examination: Secondary | ICD-10-CM | POA: Diagnosis not present

## 2019-06-02 DIAGNOSIS — R9431 Abnormal electrocardiogram [ECG] [EKG]: Secondary | ICD-10-CM | POA: Diagnosis not present

## 2019-06-02 DIAGNOSIS — I10 Essential (primary) hypertension: Secondary | ICD-10-CM | POA: Insufficient documentation

## 2019-06-02 HISTORY — DX: Pneumonia, unspecified organism: J18.9

## 2019-06-02 LAB — BASIC METABOLIC PANEL
Anion gap: 14 (ref 5–15)
BUN: 11 mg/dL (ref 8–23)
CO2: 23 mmol/L (ref 22–32)
Calcium: 9.1 mg/dL (ref 8.9–10.3)
Chloride: 100 mmol/L (ref 98–111)
Creatinine, Ser: 0.68 mg/dL (ref 0.61–1.24)
GFR calc Af Amer: >60 mL/min (ref >60)
GFR calc non Af Amer: >60 mL/min (ref >60)
Glucose, Bld: 110 mg/dL — ABNORMAL HIGH (ref 70–99)
Potassium: 3.3 mmol/L — ABNORMAL LOW (ref 3.5–5.1)
Sodium: 137 mmol/L (ref 135–145)

## 2019-06-02 LAB — CBC
HCT: 40.5 % (ref 39.0–52.0)
Hemoglobin: 13.7 g/dL (ref 13.0–17.0)
MCH: 30.9 pg (ref 26.0–34.0)
MCHC: 33.8 g/dL (ref 30.0–36.0)
MCV: 91.4 fL (ref 80.0–100.0)
Platelets: 362 10*3/uL (ref 150–400)
RBC: 4.43 MIL/uL (ref 4.22–5.81)
RDW: 16.1 % — ABNORMAL HIGH (ref 11.5–15.5)
WBC: 8.7 10*3/uL (ref 4.0–10.5)
nRBC: 0 % (ref 0.0–0.2)

## 2019-06-02 NOTE — Patient Instructions (Signed)
Your procedure is scheduled CB:ULAGT 6/25  Report to Day Surgery. To find out your arrival time please call [phone] between 1PM - 3PM on Wed. 6/24.  Remember: Instructions that are not followed completely may result in serious medical risk,  up to and including death, or upon the discretion of your surgeon and anesthesiologist your  surgery may need to be rescheduled.     _X__ 1. Do not eat food after midnight the night before your procedure.                 No gum chewing or hard candies. You may drink clear liquids up to 2 hours                 before you are scheduled to arrive for your surgery- DO not drink clear                 liquids within 2 hours of the start of your surgery.                 Clear Liquids include:  water,Black Coffee or Tea (Do not add                 anything to coffee or tea).  __X__2.  On the morning of surgery brush your teeth with toothpaste and water, you                may rinse your mouth with mouthwash if you wish.  Do not swallow any toothpaste of mouthwash.     _X__ 3.  No Alcohol for 24 hours before or after surgery.   _X__ 4.  Do Not Smoke or use e-cigarettes For 24 Hours Prior to Your Surgery.                 Do not use any chewable tobacco products for at least 6 hours prior to                 surgery.  ____  5.  Bring all medications with you on the day of surgery if instructed.   __x__  6.  Notify your doctor if there is any change in your medical condition      (cold, fever, infections).     Do not wear jewelry, make-up, hairpins, clips or nail polish. Do not wear lotions, powders, or perfumes. You may wear deodorant. Do not shave 48 hours prior to surgery. Men may shave face and neck. Do not bring valuables to the hospital.    [facility] is not responsible for any belongings or valuables.  Contacts, dentures or bridgework may not be worn into surgery. Leave your suitcase in the car. After surgery it may  be brought to your room. For patients admitted to the hospital, discharge time is determined by your treatment team.   Patients discharged the day of surgery will not be allowed to drive home.   Please read over the following fact sheets that you were given:     __x__ Take these medicines the morning of surgery with A SIP OF WATER:    1. diltiazem (CARDIZEM CD) 120 MG 24 hr capsule, diltiazem (CARDIZEM) 30 MG tablet  2. fexofenadine (ALLEGRA) 180 MG tablet  3. fluticasone (FLONASE) 50 MCG/ACT nasal spray  4.lansoprazole (PREVACID) 15 MG capsule take dose night before and one the morning of surgery  5.  6.  ____ Fleet Enema (as directed)   __x__ Use CHG Soap as directed  ____ Use inhalers on the day of surgery  _x___ Stop metformin 2 days prior to surgery Last dose night of 22nd   ____ Take 1/2 of usual insulin dose the night before surgery. No insulin the morning          of surgery.   _x___ Stopping Eliquis as per instructions of cardiology  __x__ Stop Anti-inflammatories ibuprofen (ADVIL,MOTRIN) 200 MG tablet, naproxen sodium (ANAPROX) 220 MG tablet   ____ Stop supplements until after surgery.    ____ Bring C-Pap to the hospital.

## 2019-06-02 NOTE — Pre-Procedure Instructions (Signed)
Notified patient that his potassium was a little low.  Encouraged high potassium food intake and instructed to take his potassium tablet the morning of surgery.  Will repeat  K level AM of surgery

## 2019-06-07 ENCOUNTER — Other Ambulatory Visit
Admission: RE | Admit: 2019-06-07 | Discharge: 2019-06-07 | Disposition: A | Discharge status: Home / Self Care | Payer: Medicare Other | Source: Ambulatory Visit | Attending: Surgery | Admitting: Surgery

## 2019-06-07 DIAGNOSIS — Z1159 Encounter for screening for other viral diseases: Secondary | ICD-10-CM | POA: Insufficient documentation

## 2019-06-08 LAB — NOVEL CORONAVIRUS, NAA (HOSP ORDER, SEND-OUT TO REF LAB; TAT 18-24 HRS): SARS-CoV-2, NAA: NOT DETECTED

## 2019-06-10 ENCOUNTER — Ambulatory Visit
Admission: RE | Admit: 2019-06-10 | Discharge: 2019-06-10 | Disposition: A | Discharge status: Home / Self Care | Payer: Medicare Other | Source: Ambulatory Visit | Attending: Surgery | Admitting: Surgery

## 2019-06-10 ENCOUNTER — Ambulatory Visit: Payer: Medicare Other | Admitting: Anesthesiology

## 2019-06-10 ENCOUNTER — Encounter
Admission: RE | Disposition: A | Discharge status: Home / Self Care | Payer: Self-pay | Source: Ambulatory Visit | Attending: Surgery

## 2019-06-10 ENCOUNTER — Encounter: Payer: Self-pay | Admitting: Anesthesiology

## 2019-06-10 ENCOUNTER — Other Ambulatory Visit: Payer: Self-pay

## 2019-06-10 DIAGNOSIS — I4891 Unspecified atrial fibrillation: Secondary | ICD-10-CM | POA: Insufficient documentation

## 2019-06-10 DIAGNOSIS — Z7984 Long term (current) use of oral hypoglycemic drugs: Secondary | ICD-10-CM | POA: Diagnosis not present

## 2019-06-10 DIAGNOSIS — Z86711 Personal history of pulmonary embolism: Secondary | ICD-10-CM | POA: Insufficient documentation

## 2019-06-10 DIAGNOSIS — K219 Gastro-esophageal reflux disease without esophagitis: Secondary | ICD-10-CM | POA: Diagnosis not present

## 2019-06-10 DIAGNOSIS — I10 Essential (primary) hypertension: Secondary | ICD-10-CM | POA: Insufficient documentation

## 2019-06-10 DIAGNOSIS — F329 Major depressive disorder, single episode, unspecified: Secondary | ICD-10-CM | POA: Diagnosis not present

## 2019-06-10 DIAGNOSIS — Z7901 Long term (current) use of anticoagulants: Secondary | ICD-10-CM | POA: Insufficient documentation

## 2019-06-10 DIAGNOSIS — F419 Anxiety disorder, unspecified: Secondary | ICD-10-CM | POA: Diagnosis not present

## 2019-06-10 DIAGNOSIS — Z85828 Personal history of other malignant neoplasm of skin: Secondary | ICD-10-CM | POA: Insufficient documentation

## 2019-06-10 DIAGNOSIS — J309 Allergic rhinitis, unspecified: Secondary | ICD-10-CM | POA: Diagnosis not present

## 2019-06-10 DIAGNOSIS — K4090 Unilateral inguinal hernia, without obstruction or gangrene, not specified as recurrent: Secondary | ICD-10-CM | POA: Diagnosis present

## 2019-06-10 DIAGNOSIS — E119 Type 2 diabetes mellitus without complications: Secondary | ICD-10-CM | POA: Diagnosis not present

## 2019-06-10 DIAGNOSIS — D176 Benign lipomatous neoplasm of spermatic cord: Secondary | ICD-10-CM | POA: Diagnosis not present

## 2019-06-10 DIAGNOSIS — I2510 Atherosclerotic heart disease of native coronary artery without angina pectoris: Secondary | ICD-10-CM | POA: Diagnosis not present

## 2019-06-10 DIAGNOSIS — Z6831 Body mass index (BMI) 31.0-31.9, adult: Secondary | ICD-10-CM | POA: Insufficient documentation

## 2019-06-10 DIAGNOSIS — Z79899 Other long term (current) drug therapy: Secondary | ICD-10-CM | POA: Insufficient documentation

## 2019-06-10 HISTORY — PX: INSERTION OF MESH: SHX5868

## 2019-06-10 HISTORY — PX: INGUINAL HERNIA REPAIR: SHX194

## 2019-06-10 LAB — POCT I-STAT 4, (NA,K, GLUC, HGB,HCT)
Glucose, Bld: 98 mg/dL (ref 70–99)
HCT: 42 % (ref 39.0–52.0)
Hemoglobin: 14.3 g/dL (ref 13.0–17.0)
Potassium: 3.4 mmol/L — ABNORMAL LOW (ref 3.5–5.1)
Sodium: 139 mmol/L (ref 135–145)

## 2019-06-10 LAB — GLUCOSE, CAPILLARY: Glucose-Capillary: 121 mg/dL — ABNORMAL HIGH (ref 70–99)

## 2019-06-10 SURGERY — REPAIR, HERNIA, INGUINAL, ADULT
Anesthesia: General | Site: Groin | Laterality: Right

## 2019-06-10 MED ORDER — DEXAMETHASONE SODIUM PHOSPHATE 10 MG/ML IJ SOLN
INTRAMUSCULAR | Status: DC | PRN
  Administered 2019-06-10: 10 mg via INTRAVENOUS

## 2019-06-10 MED ORDER — LIDOCAINE HCL (CARDIAC) PF 100 MG/5ML IV SOSY
PREFILLED_SYRINGE | INTRAVENOUS | Status: DC | PRN
  Administered 2019-06-10: 80 mg via INTRAVENOUS

## 2019-06-10 MED ORDER — 0.9 % SODIUM CHLORIDE (POUR BTL) OPTIME
TOPICAL | Status: DC | PRN
  Administered 2019-06-10 (×2): 500 mL

## 2019-06-10 MED ORDER — HYDROCODONE-ACETAMINOPHEN 5-325 MG PO TABS
ORAL_TABLET | ORAL | Status: AC
  Administered 2019-06-10: 17:00:00 1 via ORAL
  Filled 2019-06-10: qty 1

## 2019-06-10 MED ORDER — IBUPROFEN 800 MG PO TABS: 800.0000 mg | ORAL_TABLET | Freq: Three times a day (TID) | ORAL | 0 refills | Status: DC | PRN

## 2019-06-10 MED ORDER — FENTANYL CITRATE (PF) 100 MCG/2ML IJ SOLN: 25.0000 ug | INTRAMUSCULAR | Status: DC | PRN

## 2019-06-10 MED ORDER — ONDANSETRON HCL 4 MG/2ML IJ SOLN: 4.0000 mg | Freq: Once | INTRAMUSCULAR | Status: DC | PRN

## 2019-06-10 MED ORDER — MIDAZOLAM HCL 2 MG/2ML IJ SOLN
INTRAMUSCULAR | Status: DC | PRN
  Administered 2019-06-10: 2 mg via INTRAVENOUS

## 2019-06-10 MED ORDER — CEFAZOLIN SODIUM-DEXTROSE 2-4 GM/100ML-% IV SOLN
INTRAVENOUS | Status: AC
  Filled 2019-06-10: qty 100

## 2019-06-10 MED ORDER — CHLORHEXIDINE GLUCONATE CLOTH 2 % EX PADS: 6.0000 | MEDICATED_PAD | Freq: Once | CUTANEOUS | Status: DC

## 2019-06-10 MED ORDER — DOCUSATE SODIUM 100 MG PO CAPS: 100.0000 mg | ORAL_CAPSULE | Freq: Two times a day (BID) | ORAL | 0 refills | Status: AC | PRN

## 2019-06-10 MED ORDER — SODIUM CHLORIDE 0.9 % IV SOLN
INTRAVENOUS | Status: DC
  Administered 2019-06-10 (×2): via INTRAVENOUS

## 2019-06-10 MED ORDER — ACETAMINOPHEN 10 MG/ML IV SOLN
INTRAVENOUS | Status: DC | PRN
  Administered 2019-06-10: 1000 mg via INTRAVENOUS

## 2019-06-10 MED ORDER — LACTATED RINGERS IV SOLN: INTRAVENOUS | Status: DC | PRN

## 2019-06-10 MED ORDER — BUPIVACAINE-EPINEPHRINE (PF) 0.5% -1:200000 IJ SOLN
INTRAMUSCULAR | Status: DC | PRN
  Administered 2019-06-10: 7 mL

## 2019-06-10 MED ORDER — SUGAMMADEX SODIUM 200 MG/2ML IV SOLN
INTRAVENOUS | Status: DC | PRN
  Administered 2019-06-10: 400 mg via INTRAVENOUS

## 2019-06-10 MED ORDER — BUPIVACAINE LIPOSOME 1.3 % IJ SUSP
INTRAMUSCULAR | Status: DC | PRN
  Administered 2019-06-10: 20 mL

## 2019-06-10 MED ORDER — APIXABAN 5 MG PO TABS: 5.0000 mg | ORAL_TABLET | Freq: Two times a day (BID) | ORAL | Status: DC

## 2019-06-10 MED ORDER — HYDROCODONE-ACETAMINOPHEN 5-325 MG PO TABS: 1.0000 | ORAL_TABLET | Freq: Four times a day (QID) | ORAL | 0 refills | Status: AC | PRN

## 2019-06-10 MED ORDER — ROCURONIUM BROMIDE 100 MG/10ML IV SOLN
INTRAVENOUS | Status: DC | PRN
  Administered 2019-06-10 (×2): 20 mg via INTRAVENOUS
  Administered 2019-06-10: 40 mg via INTRAVENOUS

## 2019-06-10 MED ORDER — FENTANYL CITRATE (PF) 100 MCG/2ML IJ SOLN
INTRAMUSCULAR | Status: DC | PRN
  Administered 2019-06-10: 50 ug via INTRAVENOUS
  Administered 2019-06-10: 25 ug via INTRAVENOUS
  Administered 2019-06-10 (×2): 50 ug via INTRAVENOUS
  Administered 2019-06-10: 25 ug via INTRAVENOUS

## 2019-06-10 MED ORDER — ONDANSETRON HCL 4 MG/2ML IJ SOLN
INTRAMUSCULAR | Status: DC | PRN
  Administered 2019-06-10: 4 mg via INTRAVENOUS

## 2019-06-10 MED ORDER — CEFAZOLIN SODIUM-DEXTROSE 2-4 GM/100ML-% IV SOLN
2.0000 g | INTRAVENOUS | Status: AC
  Administered 2019-06-10: 14:00:00 2 g via INTRAVENOUS

## 2019-06-10 MED ORDER — PHENYLEPHRINE HCL (PRESSORS) 10 MG/ML IV SOLN
INTRAVENOUS | Status: DC | PRN
  Administered 2019-06-10: 100 ug via INTRAVENOUS
  Administered 2019-06-10: 50 ug via INTRAVENOUS
  Administered 2019-06-10: 100 ug via INTRAVENOUS
  Administered 2019-06-10: 50 ug via INTRAVENOUS
  Administered 2019-06-10: 100 ug via INTRAVENOUS

## 2019-06-10 MED ORDER — HYDROCODONE-ACETAMINOPHEN 5-325 MG PO TABS
1.0000 | ORAL_TABLET | Freq: Once | ORAL | Status: AC
  Administered 2019-06-10: 1 via ORAL

## 2019-06-10 MED ORDER — ACETAMINOPHEN 325 MG PO TABS: 650.0000 mg | ORAL_TABLET | Freq: Three times a day (TID) | ORAL | 0 refills | Status: AC | PRN

## 2019-06-10 MED ORDER — PROPOFOL 10 MG/ML IV BOLUS
INTRAVENOUS | Status: DC | PRN
  Administered 2019-06-10: 200 mg via INTRAVENOUS

## 2019-06-10 SURGICAL SUPPLY — 38 items
BLADE CLIPPER SURG (BLADE) ×3 IMPLANT
BLADE SURG 15 STRL LF DISP TIS (BLADE) ×1 IMPLANT
BLADE SURG 15 STRL SS (BLADE) ×2
CANISTER SUCT 1200ML W/VALVE (MISCELLANEOUS) ×3 IMPLANT
CHLORAPREP W/TINT 26 (MISCELLANEOUS) ×3 IMPLANT
COVER WAND RF STERILE (DRAPES) ×3 IMPLANT
DERMABOND ADVANCED (GAUZE/BANDAGES/DRESSINGS) ×2
DERMABOND ADVANCED .7 DNX12 (GAUZE/BANDAGES/DRESSINGS) ×1 IMPLANT
DRAIN PENROSE 1/4X12 LTX (DRAIN) ×3 IMPLANT
DRAPE LAPAROTOMY 100X77 ABD (DRAPES) ×3 IMPLANT
ELECT BLADE 6.5 EXT (BLADE) ×2 IMPLANT
ELECT REM PT RETURN 9FT ADLT (ELECTROSURGICAL) ×3
ELECTRODE REM PT RTRN 9FT ADLT (ELECTROSURGICAL) ×1 IMPLANT
GLOVE BIOGEL PI IND STRL 7.0 (GLOVE) ×1 IMPLANT
GLOVE BIOGEL PI INDICATOR 7.0 (GLOVE) ×2
GLOVE SURG SYN 6.5 ES PF (GLOVE) ×6 IMPLANT
GLOVE SURG SYN 6.5 PF PI (GLOVE) ×2 IMPLANT
GOWN STRL REUS W/ TWL LRG LVL3 (GOWN DISPOSABLE) ×2 IMPLANT
GOWN STRL REUS W/TWL LRG LVL3 (GOWN DISPOSABLE) ×4
LABEL OR SOLS (LABEL) ×3 IMPLANT
NEEDLE HYPO 22GX1.5 SAFETY (NEEDLE) ×6 IMPLANT
NS IRRIG 500ML POUR BTL (IV SOLUTION) ×3 IMPLANT
PACK BASIN MINOR ARMC (MISCELLANEOUS) ×3 IMPLANT
PLUG INGUINAL HERNIA MESH LG (Mesh General) ×2 IMPLANT
STAPLER SKIN PROX 35W (STAPLE) ×2 IMPLANT
SUT ETHIBOND NAB MO 7 #0 18IN (SUTURE) ×3 IMPLANT
SUT MNCRL 4-0 (SUTURE) ×2
SUT MNCRL 4-0 27XMFL (SUTURE) ×1
SUT SILK 3 0 12 30 (SUTURE) IMPLANT
SUT SILK 3 0 SH 30 (SUTURE) IMPLANT
SUT VIC AB 2-0 CT2 27 (SUTURE) ×3 IMPLANT
SUT VIC AB 3-0 SH 27 (SUTURE) ×4
SUT VIC AB 3-0 SH 27X BRD (SUTURE) ×1 IMPLANT
SUTURE MNCRL 4-0 27XMF (SUTURE) ×1 IMPLANT
SYR 10ML LL (SYRINGE) ×3 IMPLANT
SYR 20CC LL (SYRINGE) ×3 IMPLANT
TOWEL OR 17X26 4PK STRL BLUE (TOWEL DISPOSABLE) ×3 IMPLANT
WATER STERILE IRR 1000ML POUR (IV SOLUTION) ×3 IMPLANT

## 2019-06-10 NOTE — Anesthesia Preprocedure Evaluation (Addendum)
Anesthesia Evaluation  Patient identified by MRN, date of birth, ID band Patient awake    Reviewed: Allergy & Precautions, NPO status , Patient's Chart, lab work & pertinent test results, reviewed documented beta blocker date and time   Airway Mallampati: III  TM Distance: >3 FB     Dental  (+) Chipped, Missing   Pulmonary pneumonia, resolved,           Cardiovascular hypertension, Pt. on medications + dysrhythmias Atrial Fibrillation + Valvular Problems/Murmurs      Neuro/Psych PSYCHIATRIC DISORDERS Depression    GI/Hepatic GERD  Controlled,  Endo/Other  diabetes, Type 2Hyperthyroidism   Renal/GU      Musculoskeletal   Abdominal   Peds  Hematology   Anesthesia Other Findings Obese. Hx of PE. EKG ok. Underwent cardioversion and ablation. Missing 3 teeth.  Reproductive/Obstetrics                            Anesthesia Physical Anesthesia Plan  ASA: III  Anesthesia Plan: General   Post-op Pain Management:    Induction: Intravenous  PONV Risk Score and Plan:   Airway Management Planned: Oral ETT  Additional Equipment:   Intra-op Plan:   Post-operative Plan:   Informed Consent: I have reviewed the patients History and Physical, chart, labs and discussed the procedure including the risks, benefits and alternatives for the proposed anesthesia with the patient or authorized representative who has indicated his/her understanding and acceptance.       Plan Discussed with: CRNA  Anesthesia Plan Comments:         Anesthesia Quick Evaluation

## 2019-06-10 NOTE — Anesthesia Postprocedure Evaluation (Signed)
Anesthesia Post Note  Patient: Basim Bartnik.  Procedure(s) Performed: RIGHT INGUINAL HERNIA REPAIR (Right Groin) INSERTION OF MESH (Right Groin)  Patient location during evaluation: PACU Anesthesia Type: General Level of consciousness: awake and alert and oriented Pain management: pain level controlled Vital Signs Assessment: post-procedure vital signs reviewed and stable Respiratory status: spontaneous breathing Cardiovascular status: blood pressure returned to baseline Anesthetic complications: no     Last Vitals:  Vitals:   06/10/19 1710 06/10/19 1807  BP: (!) 157/92 (!) 156/93  Pulse: 94 92  Resp: 16 16  Temp: 36.5 C   SpO2: 95% 96%    Last Pain:  Vitals:   06/10/19 1807  TempSrc:   PainSc: 3                  Marquin Patino

## 2019-06-10 NOTE — Interval H&P Note (Signed)
History and Physical Interval Note:  06/10/2019 1:39 PM  [name].  has presented today for surgery, with the diagnosis of K40.90 NON RECURRENT UNILATERAL INGUINAL HERNIA WITH OUT OBSTRUCTION OR GANGRENE.  The various methods of treatment have been discussed with the patient and family. After consideration of risks, benefits and other options for treatment, the patient has consented to  Procedure(s): HERNIA REPAIR INGUINAL ADULT OPEN RIGHT - DIABETIC (Right) as a surgical intervention.  The patient's history has been reviewed, patient examined, no change in status, stable for surgery.  I have reviewed the patient's chart and labs.  Questions were answered to the patient's satisfaction.     Chardonnay Holzmann Lysle Pearl

## 2019-06-10 NOTE — Discharge Instructions (Signed)
Hernia repair, Care After This sheet gives you information about how to care for yourself after your procedure. Your health care provider may also give you more specific instructions. If you have problems or questions, contact your health care provider. What can I expect after the procedure? After your procedure, it is common to have the following:  Pain in your abdomen, especially in the incision areas. You will be given medicine to control the pain.  Tiredness. This is a normal part of the recovery process. Your energy level will return to normal over the next several weeks.  Changes in your bowel movements, such as constipation or needing to go more often. Talk with your health care provider about how to manage this. Follow these instructions at home: Medicines   tylenol and advil as needed for discomfort.  Please alternate between the two every four hours as needed for pain.     Use narcotics, if prescribed, only when tylenol and motrin is not enough to control pain.   325-650mg  every 8hrs to max of 3000mg /24hrs (including the 325mg  in every norco dose) for the tylenol.     Advil up to 800mg  per dose every 8hrs as needed for pain.    Do not drive or use heavy machinery while taking prescription pain medicine.  Do not drink alcohol while taking prescription pain medicine.  Incision care     Follow instructions from your health care provider about how to take care of your incision areas. Make sure you: ? Keep your incisions clean and dry. ? Wash your hands with soap and water before and after applying medicine to the areas, and before and after changing your bandage (dressing). If soap and water are not available, use hand sanitizer. ? Change your dressing as told by your health care provider. ? Leave stitches (sutures), skin glue, or adhesive strips in place. These skin closures may need to stay in place for 2 weeks or longer. If adhesive strip edges start to loosen and curl up,  you may trim the loose edges. Do not remove adhesive strips completely unless your health care provider tells you to do that.  Do not wear tight clothing over the incisions. Tight clothing may rub and irritate the incision areas, which may cause the incisions to open.  Do not take baths, swim, or use a hot tub until your health care provider approves. OK TO SHOWER IN 24HRS.    Check your incision area every day for signs of infection. Check for: ? More redness, swelling, or pain. ? More fluid or blood. ? Warmth. ? Pus or a bad smell. Activity  Avoid lifting anything that is heavier than 10 lb (4.5 kg) for 2 weeks or until your health care provider says it is okay.  No pushing/pulling greater than 30lbs  You may resume normal activities as told by your health care provider. Ask your health care provider what activities are safe for you.  Take rest breaks during the day as needed. Eating and drinking  Follow instructions from your health care provider about what you can eat after surgery.  To prevent or treat constipation while you are taking prescription pain medicine, your health care provider may recommend that you: ? Drink enough fluid to keep your urine clear or pale yellow. ? Take over-the-counter or prescription medicines. ? Eat foods that are high in fiber, such as fresh fruits and vegetables, whole grains, and beans. ? Limit foods that are high in fat and processed sugars,  such as fried and sweet foods. General instructions  Ask your health care provider when you will need an appointment to get your sutures or staples removed.  Keep all follow-up visits as told by your health care provider. This is important. Contact a health care provider if:  You have more redness, swelling, or pain around your incisions.  You have more fluid or blood coming from the incisions.  Your incisions feel warm to the touch.  You have pus or a bad smell coming from your incisions or your  dressing.  You have a fever.  You have an incision that breaks open (edges not staying together) after sutures or staples have been removed. Get help right away if:  You develop a rash.  You have chest pain or difficulty breathing.  You have pain or swelling in your legs.  You feel light-headed or you faint.  Your abdomen swells (becomes distended).  You have nausea or vomiting.  You have blood in your stool (feces). This information is not intended to replace advice given to you by your health care provider. Make sure you discuss any questions you have with your health care provider. Document Released: 06/21/2005 Document Revised: 08/21/2018 Document Reviewed: 09/02/2016 Elsevier Interactive Patient Education  2019 [place]   1) The drugs that you were given will stay in your system until tomorrow so for the next 24 hours you should not:  A) Drive an automobile B) Make any legal decisions C) Drink any alcoholic beverage   2) You may resume regular meals tomorrow.  Today it is better to start with liquids and gradually work up to solid foods.  You may eat anything you prefer, but it is better to start with liquids, then soup and crackers, and gradually work up to solid foods.   3) Please notify your doctor immediately if you have any unusual bleeding, trouble breathing, redness and pain at the surgery site, drainage, fever, or pain not relieved by medication.    4) Additional Instructions:        Please contact your physician with any problems or Same Day Surgery at [phone], Monday through Friday 6 am to 4 pm, or [place] at [facility] number at [phone].

## 2019-06-10 NOTE — Anesthesia Procedure Notes (Signed)
Procedure Name: Intubation Date/Time: 06/10/2019 2:08 PM Performed by: Philbert Riser, CRNA Pre-anesthesia Checklist: Patient identified, Emergency Drugs available, Suction available, Patient being monitored and Timeout performed Patient Re-evaluated:Patient Re-evaluated prior to induction Oxygen Delivery Method: Circle system utilized Preoxygenation: Pre-oxygenation with 100% oxygen Induction Type: IV induction Ventilation: Mask ventilation without difficulty Laryngoscope Size: Mac and 3 Grade View: Grade II Tube type: Oral Tube size: 7.5 mm Number of attempts: 1 Airway Equipment and Method: Stylet Placement Confirmation: ETT inserted through vocal cords under direct vision,  positive ETCO2 and breath sounds checked- equal and bilateral Secured at: 22 cm Tube secured with: Tape Dental Injury: Teeth and Oropharynx as per pre-operative assessment

## 2019-06-10 NOTE — Transfer of Care (Signed)
Immediate Anesthesia Transfer of Care Note  Patient: [name].  Procedure(s) Performed: RIGHT INGUINAL HERNIA REPAIR (Right Groin) INSERTION OF MESH (Right Groin)  Patient Location: PACU  Anesthesia Type:General  Level of Consciousness: awake and patient cooperative  Airway & Oxygen Therapy: Patient Spontanous Breathing and Patient connected to face mask oxygen  Post-op Assessment: Report given to RN and Post -op Vital signs reviewed and stable  Post vital signs: stable  Last Vitals:  Vitals Value Taken Time  BP 143/92 06/10/19 1637  Temp 36.7 C 06/10/19 1637  Pulse 91 06/10/19 1643  Resp 15 06/10/19 1643  SpO2 98 % 06/10/19 1643  Vitals shown include unvalidated device data.  Last Pain:  Vitals:   06/10/19 1637  TempSrc:   PainSc: 3          Complications: No apparent anesthesia complications

## 2019-06-10 NOTE — Anesthesia Post-op Follow-up Note (Signed)
Anesthesia QCDR form completed.        

## 2019-06-11 ENCOUNTER — Encounter: Payer: Self-pay | Admitting: Surgery

## 2019-06-11 NOTE — Op Note (Signed)
Preoperative diagnosis: Right Inguinal Hernia.  Postoperative diagnosis: right indirect  Inguinal Hernia  Procedure:  Open right indirect Inguinal hernia repair with mesh  Anesthesia: General, LMA  Surgeon: Dr. Lysle Pearl  Wound Classification: Clean  Specimen: none  Complications: None  Estimated Blood Loss: 39mL   Indications:  Patient is a 68 y.o. male developed a symptomatic right indirect inguinal hernia. Repair was indicated to avoid complications of incarceration, obstruction and pain, and a prosthetic mesh repair was elected.  Findings: 1. Vas Deferens and cord structures identified and preserved 2. Bard plug and patchmesh used for repair 3. Adequate hemostasis achieved  Description of procedure: The patient was taken to the operating room. A time-out was completed verifying correct patient, procedure, site, positioning, and implant(s) and/or special equipment prior to beginning this procedure. The right groin was prepped and draped in the usual sterile fashion. An incision was marked in a natural skin crease and planned to end near the pubic tubercle.  The skin crease incision was made with a knife and deepened through Scarpa's and Camper's fascia with electrocautery.  Additional time than the average was needed to expose the external oblique due to patient's significant pannus that overlied the area, requiring extensive dissection until the aponeurosis of the external oblique was encountered. This was cleaned and the external ring was exposed. Hemostasis was achieved in the wound. An incision was made in the midportion of the external oblique aponeurosis in the direction of its fibers. The ilioinguinal nerve was identified and resected due to its location not allowing adequate exposure of the large indirect hernia. Flaps of the external oblique were developed cephalad and inferiorly.  The cord was identified. It was gently dissected free at the pubic tubercle and encircled with a  Penrose drain. Attention was directed to the anteromedial aspect of the cord, where a large cord lipoma was noted.  This lipoma was carefully dissected free of the cord down to the level of the internal ring and then suture ligated and removed off operative field pending pathology.  Towards the level of the internal ring during this dissection, a small indirect hernia sac was identified. The sac was carefully dissected free of the cord down to the level of the internal ring and reduced back into abdominal cavity.     The vas and testicular vessels were identified and protected from harm.  Due to the large size of the cord lipoma, and pannus surrounding it, extensive dissection was required at this point and considering the majority of the time required for this operation.  Attention then turned to the floor of the canal, which was intact.  In order to adequately recover the large internal ring defect, decision was made to use a large Bard plug and patch mesh.  Plug was inserted into the internal ring and secured in place to the surrounding transversalis fascia and Cooper's ligament with interrupted 0 Ethibond sutures.  The onlay mesh was then inserted and secured to the pubic tubercle, transversalis fascia, and Cooper's ligament using interrupted 0 ethibond sutures. Care was taken to assure that the mesh was placed flat against the floor and wrapped loosely around the cord structures.  Hemostasis was again checked. The Penrose drain was removed. Exparel infused as an ilioinguinal block.  The external oblique aponeurosis was closed with a running suture of 2-0 Vicryl, Scarpa's fascia was closed with interrupted 3-0 Vicryl.  The deep dermal layer closed with interrupted 3-0 Vicryl.  The skin was closed with a subcuticular stitch of  Monocryl 4-0. Dermabond was applied.  The testis was gently pulled down into its anatomic position in the scrotum.  The patient tolerated the procedure well and was taken to the  postanesthesia care unit in stable condition. Sponge and instrument count correct at end of procedure.

## 2019-06-14 LAB — SURGICAL PATHOLOGY

## 2019-09-24 ENCOUNTER — Other Ambulatory Visit: Payer: Self-pay | Admitting: Student

## 2019-09-24 DIAGNOSIS — M5442 Lumbago with sciatica, left side: Secondary | ICD-10-CM

## 2019-09-24 DIAGNOSIS — G8929 Other chronic pain: Secondary | ICD-10-CM

## 2019-09-29 ENCOUNTER — Other Ambulatory Visit: Payer: Self-pay | Admitting: Gastroenterology

## 2019-09-29 DIAGNOSIS — R7401 Elevation of levels of liver transaminase levels: Secondary | ICD-10-CM

## 2019-10-04 ENCOUNTER — Other Ambulatory Visit: Payer: Self-pay

## 2019-10-04 ENCOUNTER — Ambulatory Visit
Admission: RE | Admit: 2019-10-04 | Discharge: 2019-10-04 | Disposition: A | Discharge status: Home / Self Care | Payer: Medicare Other | Source: Ambulatory Visit | Attending: Student | Admitting: Student

## 2019-10-04 DIAGNOSIS — M5442 Lumbago with sciatica, left side: Secondary | ICD-10-CM | POA: Diagnosis present

## 2019-10-04 DIAGNOSIS — G8929 Other chronic pain: Secondary | ICD-10-CM | POA: Insufficient documentation

## 2019-10-06 ENCOUNTER — Ambulatory Visit
Admission: RE | Admit: 2019-10-06 | Discharge: 2019-10-06 | Disposition: A | Discharge status: Home / Self Care | Payer: Medicare Other | Source: Ambulatory Visit | Attending: Gastroenterology | Admitting: Gastroenterology

## 2019-10-06 ENCOUNTER — Other Ambulatory Visit: Payer: Self-pay

## 2019-10-06 DIAGNOSIS — R7401 Elevation of levels of liver transaminase levels: Secondary | ICD-10-CM | POA: Insufficient documentation

## 2019-10-25 ENCOUNTER — Telehealth: Payer: Self-pay

## 2019-10-25 NOTE — Telephone Encounter (Signed)
Received referral for one year repeat EUS. Case sent to Dr. Mont Dutton for review. I have spoken with [name] and he can be scheduled for 12/3 following review. He has seen cardiologist and received cardiac clearance and permission to hold Eliquis. I will contact him regarding instructions once review complete.

## 2019-10-28 ENCOUNTER — Telehealth: Payer: Self-pay

## 2019-10-28 ENCOUNTER — Other Ambulatory Visit: Payer: Self-pay

## 2019-10-28 NOTE — Telephone Encounter (Signed)
EUS scheduled for 11/18/19 with Dr. Mont Dutton. Went over instructions below, including directions for COVID-19 testing. Instructed to hold Eliquis 3 days prior to EUS. Take dose 11/29 then stop. He will be instructed when to resume following EUS. Instructed to hold Metformin and to bring these medications with him the day of his EUS. Copy of instructions mailed to home address along with my contact information for any future questions.  INSTRUCTIONS FOR ENDOSCOPIC ULTRASOUND -Your procedure has been scheduled for December 3rd with Dr. Tillie Rung at [facility]. -The hospital may contact you to pre-register over the phone.  -To get your scheduled arrival time, please call the Endoscopy unit at  [phone] between 1-3 p.m. on:  December 2nd     -ON THE DAY OF YOU PROCEDURE:   1. If you are scheduled for a morning procedure, nothing to drink after midnight  -If you are scheduled for an afternoon procedure, you may have clear liquids until 5 hours prior  to the procedure but no carbonated drinks or broth  2. NO FOOD THE DAY OF YOUR PROCEDURE  3. You may take your heart, seizure, blood pressure, Parkinson's or breathing medications at  6am with just enough water to get your pills down  4. Do not take any oral Diabetic medications the morning of your procedure.  5. If you are a diabetic and are using insulin, please notify your prescribing physician of this  procedure, as your dose may need to be altered related to not being able to eat or drink.   6. Do not take vitamins, iron, or fish oil for 5 days before your procedure  HOLD ELIQUIS 3 DAYS PRIOR TO EUS. TAKE ELIQUIS 11/29 THEN HOLD     -On the day of your procedure, come to the [facility] Admitting/Registration desk (First desk on the right) at the scheduled arrival time. You MUST have someone drive you home from your procedure. You must have a responsible adult with a valid driver's license who is on site  throughout your entire procedure and who can stay with you for several hours after your procedure. You may not go home alone in a taxi, shuttle [place] or bus, as the drivers will not be responsible for you.  --If you have any questions please call me at the above contact   In Preparation of your upcoming procedure you are having testing for Covid-19 on Monday, November 30th, at the [facility]. This is a drive-thru testing site. Please come between 10:30 - 12:30.  We are asking that you stay at home and avoid visitors from this point until after your procedure is completed to minimize potential for Covid-19 exposure.    Please Wash your hands frequently with soap and water or clean hands with an alcohol-based hand sanitizer often.  Do not touch your eyes, nose, and mouth, especially with unwashed hands.  Should you at any time develop new symptoms of fever, cough, sneezing or runny nose, sore throat, difficulty breathing, or unexplained body aches, we ask that you contact your provider.   It may take up to 48 hours for results of this testing to be made available.   You will not receive notification if test results are negative.  If test results are positive for Covid-19, your provider or his/her representative will notify you by phone, with additional instructions.

## 2019-11-15 ENCOUNTER — Other Ambulatory Visit
Admission: RE | Admit: 2019-11-15 | Discharge: 2019-11-15 | Disposition: A | Discharge status: Home / Self Care | Payer: Medicare Other | Source: Ambulatory Visit | Attending: Internal Medicine | Admitting: Internal Medicine

## 2019-11-15 DIAGNOSIS — Z01812 Encounter for preprocedural laboratory examination: Secondary | ICD-10-CM | POA: Diagnosis present

## 2019-11-15 DIAGNOSIS — Z20828 Contact with and (suspected) exposure to other viral communicable diseases: Secondary | ICD-10-CM | POA: Diagnosis not present

## 2019-11-16 LAB — SARS CORONAVIRUS 2 (TAT 6-24 HRS): SARS Coronavirus 2: NEGATIVE

## 2019-11-18 ENCOUNTER — Ambulatory Visit: Payer: Medicare Other | Admitting: Certified Registered"

## 2019-11-18 ENCOUNTER — Other Ambulatory Visit: Payer: Self-pay

## 2019-11-18 ENCOUNTER — Encounter
Admission: RE | Disposition: A | Discharge status: Home / Self Care | Payer: Self-pay | Source: Home / Self Care | Attending: Internal Medicine

## 2019-11-18 ENCOUNTER — Ambulatory Visit
Admission: RE | Admit: 2019-11-18 | Discharge: 2019-11-18 | Disposition: A | Discharge status: Home / Self Care | Payer: Medicare Other | Attending: Internal Medicine | Admitting: Internal Medicine

## 2019-11-18 ENCOUNTER — Encounter: Payer: Self-pay | Admitting: *Deleted

## 2019-11-18 DIAGNOSIS — Z7901 Long term (current) use of anticoagulants: Secondary | ICD-10-CM | POA: Insufficient documentation

## 2019-11-18 DIAGNOSIS — E119 Type 2 diabetes mellitus without complications: Secondary | ICD-10-CM | POA: Diagnosis not present

## 2019-11-18 DIAGNOSIS — Z881 Allergy status to other antibiotic agents status: Secondary | ICD-10-CM | POA: Diagnosis not present

## 2019-11-18 DIAGNOSIS — Z885 Allergy status to narcotic agent status: Secondary | ICD-10-CM | POA: Insufficient documentation

## 2019-11-18 DIAGNOSIS — E0590 Thyrotoxicosis, unspecified without thyrotoxic crisis or storm: Secondary | ICD-10-CM | POA: Insufficient documentation

## 2019-11-18 DIAGNOSIS — Z79899 Other long term (current) drug therapy: Secondary | ICD-10-CM | POA: Insufficient documentation

## 2019-11-18 DIAGNOSIS — Z833 Family history of diabetes mellitus: Secondary | ICD-10-CM | POA: Insufficient documentation

## 2019-11-18 DIAGNOSIS — I10 Essential (primary) hypertension: Secondary | ICD-10-CM | POA: Insufficient documentation

## 2019-11-18 DIAGNOSIS — I2510 Atherosclerotic heart disease of native coronary artery without angina pectoris: Secondary | ICD-10-CM | POA: Insufficient documentation

## 2019-11-18 DIAGNOSIS — Z7984 Long term (current) use of oral hypoglycemic drugs: Secondary | ICD-10-CM | POA: Diagnosis not present

## 2019-11-18 DIAGNOSIS — I4891 Unspecified atrial fibrillation: Secondary | ICD-10-CM | POA: Diagnosis not present

## 2019-11-18 DIAGNOSIS — Z888 Allergy status to other drugs, medicaments and biological substances status: Secondary | ICD-10-CM | POA: Diagnosis not present

## 2019-11-18 DIAGNOSIS — K219 Gastro-esophageal reflux disease without esophagitis: Secondary | ICD-10-CM | POA: Diagnosis not present

## 2019-11-18 DIAGNOSIS — F419 Anxiety disorder, unspecified: Secondary | ICD-10-CM | POA: Diagnosis not present

## 2019-11-18 DIAGNOSIS — K3189 Other diseases of stomach and duodenum: Secondary | ICD-10-CM | POA: Diagnosis present

## 2019-11-18 DIAGNOSIS — Z882 Allergy status to sulfonamides status: Secondary | ICD-10-CM | POA: Diagnosis not present

## 2019-11-18 DIAGNOSIS — F329 Major depressive disorder, single episode, unspecified: Secondary | ICD-10-CM | POA: Insufficient documentation

## 2019-11-18 DIAGNOSIS — K228 Other specified diseases of esophagus: Secondary | ICD-10-CM | POA: Diagnosis not present

## 2019-11-18 DIAGNOSIS — K2270 Barrett's esophagus without dysplasia: Secondary | ICD-10-CM | POA: Insufficient documentation

## 2019-11-18 DIAGNOSIS — E785 Hyperlipidemia, unspecified: Secondary | ICD-10-CM | POA: Insufficient documentation

## 2019-11-18 DIAGNOSIS — Z85828 Personal history of other malignant neoplasm of skin: Secondary | ICD-10-CM | POA: Diagnosis not present

## 2019-11-18 HISTORY — PX: EUS: SHX5427

## 2019-11-18 LAB — GLUCOSE, CAPILLARY: Glucose-Capillary: 99 mg/dL (ref 70–99)

## 2019-11-18 SURGERY — ULTRASOUND, UPPER GI TRACT, ENDOSCOPIC
Anesthesia: General

## 2019-11-18 MED ORDER — GLYCOPYRROLATE 0.2 MG/ML IJ SOLN
INTRAMUSCULAR | Status: AC
  Filled 2019-11-18: qty 1

## 2019-11-18 MED ORDER — MIDAZOLAM HCL 2 MG/2ML IJ SOLN
INTRAMUSCULAR | Status: AC
  Filled 2019-11-18: qty 2

## 2019-11-18 MED ORDER — PROPOFOL 10 MG/ML IV BOLUS
INTRAVENOUS | Status: DC | PRN
  Administered 2019-11-18: 70 mg via INTRAVENOUS
  Administered 2019-11-18: 30 mg via INTRAVENOUS

## 2019-11-18 MED ORDER — PROPOFOL 500 MG/50ML IV EMUL
INTRAVENOUS | Status: DC | PRN
  Administered 2019-11-18: 120 ug/kg/min via INTRAVENOUS

## 2019-11-18 MED ORDER — LIDOCAINE 2% (20 MG/ML) 5 ML SYRINGE
INTRAMUSCULAR | Status: DC | PRN
  Administered 2019-11-18: 25 mg via INTRAVENOUS

## 2019-11-18 MED ORDER — FENTANYL CITRATE (PF) 100 MCG/2ML IJ SOLN
INTRAMUSCULAR | Status: AC
  Filled 2019-11-18: qty 2

## 2019-11-18 MED ORDER — SODIUM CHLORIDE 0.9 % IV SOLN
INTRAVENOUS | Status: DC
  Administered 2019-11-18: 08:00:00 via INTRAVENOUS

## 2019-11-18 MED ORDER — PROPOFOL 500 MG/50ML IV EMUL
INTRAVENOUS | Status: AC
  Filled 2019-11-18: qty 100

## 2019-11-18 MED ORDER — LIDOCAINE HCL (PF) 2 % IJ SOLN
INTRAMUSCULAR | Status: AC
  Filled 2019-11-18: qty 10

## 2019-11-18 MED ORDER — PROPOFOL 10 MG/ML IV BOLUS
INTRAVENOUS | Status: AC
  Filled 2019-11-18: qty 20

## 2019-11-18 MED ORDER — MIDAZOLAM HCL 5 MG/5ML IJ SOLN
INTRAMUSCULAR | Status: DC | PRN
  Administered 2019-11-18: 2 mg via INTRAVENOUS

## 2019-11-18 NOTE — Anesthesia Post-op Follow-up Note (Signed)
Anesthesia QCDR form completed.        

## 2019-11-18 NOTE — Discharge Instructions (Signed)
Discharge to home.  May resume Eliquis today.

## 2019-11-18 NOTE — H&P (Signed)
Patient presents for scheduled EUS today for surveillance of a gastric nodule.  No changes in medical history since time of last EUS examination.  Eliquis has been on hold for 3 days prior to procedure.  No contraindications to proceeding with the EUS procedure.

## 2019-11-18 NOTE — Transfer of Care (Signed)
Immediate Anesthesia Transfer of Care Note  Patient: [name].  Procedure(s) Performed: FULL UPPER ENDOSCOPIC ULTRASOUND (EUS) RADIAL (N/A )  Patient Location: endo  Anesthesia Type:General  Level of Consciousness: awake and alert   Airway & Oxygen Therapy: Patient Spontanous Breathing and Patient connected to nasal cannula oxygen  Post-op Assessment: Report given to RN and Post -op Vital signs reviewed and stable  Post vital signs: Reviewed  Last Vitals:  Vitals Value Taken Time  BP 143/103 11/18/19 0830  Temp    Pulse 79 11/18/19 0830  Resp 21 11/18/19 0830  SpO2 98 % 11/18/19 0830    Last Pain:  Vitals:   11/18/19 0724  PainSc: 0-No pain         Complications: No apparent anesthesia complications
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
## 2019-11-18 NOTE — Op Note (Signed)
[facility] Patient Name: [name] Procedure Date: 11/18/2019 8:04 AM MRN: [id number] Account #: [id number] Date of Birth: 1951-01-01 Admit Type: Outpatient Age: 68 Room: [facility] ENDO ROOM 3 Gender: Male Note Status: Finalized Procedure:             Upper EUS Indications:           Gastric deformity on endoscopy/Subepithelial tumor                         versus extrinsic compression (present since 2018 with                         EUS in 2019 suggesting possible lipoma) Patient Profile:       Refer to note in patient chart for documentation of                         history and physical. Providers:             Murray Hodgkins. [place] Referring MD:          Baxter Hire, MD (Referring MD) Medicines:             Propofol per Anesthesia Complications:         No immediate complications. Procedure:             Pre-Anesthesia Assessment:                        Prior to the procedure, a History and Physical was                         performed, and patient medications and allergies were                         reviewed. The patient is competent. The risks and                         benefits of the procedure and the sedation options and                         risks were discussed with the patient. All questions                         were answered and informed consent was obtained.                         Patient identification and proposed procedure were                         verified by the physician, the nurse and the                         anesthesiologist in the pre-procedure area. Mental                         Status Examination: alert and oriented. Airway                         Examination: normal oropharyngeal airway and neck  mobility. Respiratory Examination: clear to                         auscultation. CV Examination: normal. Prophylactic                         Antibiotics: The patient does not require  prophylactic                         antibiotics. Prior Anticoagulants: The patient has                         taken Eliquis (apixaban), last dose was 3 days prior                         to procedure. ASA Grade Assessment: III - A patient                         with severe systemic disease. After reviewing the                         risks and benefits, the patient was deemed in                         satisfactory condition to undergo the procedure. The                         anesthesia plan was to use monitored anesthesia care                         (MAC). Immediately prior to administration of                         medications, the patient was re-assessed for adequacy                         to receive sedatives. The heart rate, respiratory                         rate, oxygen saturations, blood pressure, adequacy of                         pulmonary ventilation, and response to care were                         monitored throughout the procedure. The physical                         status of the patient was re-assessed after the                         procedure.                        After obtaining informed consent, the endoscope was                         passed under direct vision. Throughout the procedure,  the patient's blood pressure, pulse, and oxygen                         saturations were monitored continuously. The Endoscope                         was introduced through the mouth, and advanced to the                         second part of duodenum. The Endoscope was introduced                         through the mouth, and advanced to the stomach for                         ultrasound examination from the esophagus and stomach.                         The upper EUS was accomplished without difficulty. The                         patient tolerated the procedure well. Findings:      ENDOSCOPIC FINDING: :      There were esophageal mucosal  changes consistent with short-segment       Barrett's esophagus present in the lower third of the esophagus. The       maximum longitudinal extent of these mucosal changes was 1 cm in length.      A single small submucosal nodule was found on the posterior wall of the       gastric antrum.      The examined duodenum was endoscopically normal.      ENDOSONOGRAPHIC FINDING: :      An oval intramural (subepithelial) lesion was found in the antrum of the       stomach. The lesion was heterogenous. Sonographically, the lesion       appeared to originate from the submucosa (Layer 3). The lesion also       measured 8 mm by 5 mm in diameter. The outer endosonographic borders       were well defined.      No lymphadenopathy seen. Impression:            EGD Impressions:                        - Esophageal mucosal changes consistent with                         short-segment Barrett's esophagus.                        - A single submucosal nodule was found in the stomach.                        - Normal examined duodenum.                        EUS Impressions:                        - An intramural (subepithelial) lesion was found in  the antrum of the stomach. The lesion appeared to                         originate from within the submucosa (Layer 3). Tissue                         has not been obtained. 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 Unchanged in                         size and appearance compared to prior endoscopic and                         EUS examinations in 2018 and 2019 respectively.                        - No lymphadenopathy visualized.                        - No specimens collected. Recommendation:        - Discharge patient to home (ambulatory).                        - No further routine EUS surveillance procedures                         recommended given stable size and benign appearance of                          lesion on susbeqent EUS examinations 1 year apart.                        - May resume Eliquis today.                        - The findings and recommendations were discussed with                         the patient.                        - Return to referring physician as previously                         scheduled. Procedure Code(s):     --- Professional ---                        [phone], Esophagogastroduodenoscopy, flexible,                         transoral; with endoscopic ultrasound examination                         limited to the esophagus, stomach or duodenum, and                         adjacent structures Diagnosis Code(s):     --- Professional ---  K31.89, Other diseases of stomach and duodenum                        K22.8, Other specified diseases of esophagus CPT copyright 2019 American [employer] Association. All rights reserved. The codes documented in this report are preliminary and upon coder review may  be revised to meet current compliance requirements. Attending Participation:      I personally performed the entire procedure without the assistance of a       fellow, resident or surgical assistant. [place],  11/18/2019 8:37:14 AM This report has been signed electronically. Number of Addenda: 0 Note Initiated On: 11/18/2019 8:04 AM Estimated Blood Loss:  Estimated blood loss: none.      [facility]

## 2019-11-19 ENCOUNTER — Encounter: Payer: Self-pay | Admitting: Internal Medicine

## 2019-11-19 NOTE — Anesthesia Postprocedure Evaluation (Signed)
Anesthesia Post Note  Patient: [name].  Procedure(s) Performed: FULL UPPER ENDOSCOPIC ULTRASOUND (EUS) RADIAL (N/A )  Patient location during evaluation: PACU Anesthesia Type: General Level of consciousness: awake and alert Pain management: pain level controlled Vital Signs Assessment: post-procedure vital signs reviewed and stable Respiratory status: spontaneous breathing, nonlabored ventilation and respiratory function stable Cardiovascular status: blood pressure returned to baseline and stable Postop Assessment: no apparent nausea or vomiting Anesthetic complications: no     Last Vitals:  Vitals:   11/18/19 0830 11/18/19 0849  BP: (!) 143/103   Pulse: 79 70  Resp: (!) 21 18  Temp:    SpO2: 98% 98%    Last Pain:  Vitals:   11/19/19 0737  TempSrc:   PainSc: 0-No pain                 Durenda Hurt

## 2020-04-25 ENCOUNTER — Other Ambulatory Visit: Payer: Self-pay | Admitting: Otolaryngology

## 2020-04-25 DIAGNOSIS — J328 Other chronic sinusitis: Secondary | ICD-10-CM

## 2020-04-25 DIAGNOSIS — J3489 Other specified disorders of nose and nasal sinuses: Secondary | ICD-10-CM

## 2020-04-28 ENCOUNTER — Ambulatory Visit: Payer: Medicare PPO

## 2020-05-11 ENCOUNTER — Encounter (INDEPENDENT_AMBULATORY_CARE_PROVIDER_SITE_OTHER): Payer: Self-pay

## 2020-05-11 ENCOUNTER — Ambulatory Visit
Admission: RE | Admit: 2020-05-11 | Discharge: 2020-05-11 | Disposition: A | Discharge status: Home / Self Care | Payer: Medicare PPO | Source: Ambulatory Visit | Attending: Otolaryngology | Admitting: Otolaryngology

## 2020-05-11 ENCOUNTER — Other Ambulatory Visit: Payer: Self-pay

## 2020-05-11 DIAGNOSIS — J328 Other chronic sinusitis: Secondary | ICD-10-CM

## 2020-05-11 DIAGNOSIS — J3489 Other specified disorders of nose and nasal sinuses: Secondary | ICD-10-CM | POA: Insufficient documentation

## 2020-05-19 NOTE — Anesthesia Preprocedure Evaluation (Addendum)
Anesthesia Evaluation  Patient identified by MRN, date of birth, ID band  Reviewed: Allergy & Precautions, H&P , NPO status , Patient's Chart, lab work & pertinent test results  Airway Mallampati: II  TM Distance: >3 FB Neck ROM: full    Dental  (+) Partial Upper   Pulmonary neg pulmonary ROS,    Pulmonary exam normal        Cardiovascular hypertension, Normal cardiovascular exam+ dysrhythmias Atrial Fibrillation + Valvular Problems/Murmurs  Rhythm:regular Rate:Normal  H/o Afib- ablation in 2017 and no issues since.  Says that he does have a "skipped beat" on occaison.     Neuro/Psych negative neurological ROS     GI/Hepatic   Endo/Other  diabetes, Well Controlled, Type 2  Renal/GU   negative genitourinary   Musculoskeletal   Abdominal   Peds  Hematology   Anesthesia Other Findings   Reproductive/Obstetrics                            Anesthesia Physical Anesthesia Plan  ASA: III  Anesthesia Plan: General ETT   Post-op Pain Management:    Induction:   PONV Risk Score and Plan:   Airway Management Planned:   Additional Equipment:   Intra-op Plan:   Post-operative Plan:   Informed Consent: I have reviewed the patients History and Physical, chart, labs and discussed the procedure including the risks, benefits and alternatives for the proposed anesthesia with the patient or authorized representative who has indicated his/her understanding and acceptance.       Plan Discussed with:   Anesthesia Plan Comments:         Anesthesia Quick Evaluation

## 2020-05-31 ENCOUNTER — Other Ambulatory Visit: Payer: Self-pay

## 2020-05-31 ENCOUNTER — Encounter: Payer: Self-pay | Admitting: Otolaryngology

## 2020-06-05 NOTE — Discharge Instructions (Signed)
[facility] ENDOSCOPIC SINUS SURGERY [facility]  What is Functional Endoscopic Sinus Surgery?  The Surgery involves making the natural openings of the sinuses larger by removing the bony partitions that separate the sinuses from the nasal cavity.  The natural sinus lining is preserved as much as possible to allow the sinuses to resume normal function after the surgery.  In some patients nasal polyps (excessively swollen lining of the sinuses) may be removed to relieve obstruction of the sinus openings.  The surgery is performed through the nose using lighted scopes, which eliminates the need for incisions on the face.  A septoplasty is a different procedure which is sometimes performed with sinus surgery.  It involves straightening the boy partition that separates the two sides of your nose.  A crooked or deviated septum may need repair if is obstructing the sinuses or nasal airflow.  Turbinate reduction is also often performed during sinus surgery.  The turbinates are bony proturberances from the side walls of the nose which swell and can obstruct the nose in patients with sinus and allergy problems.  Their size can be surgically reduced to help relieve nasal obstruction.  What Can Sinus Surgery Do For Me?  Sinus surgery can reduce the frequency of sinus infections requiring antibiotic treatment.  This can provide improvement in nasal congestion, post-nasal drainage, facial pressure and nasal obstruction.  Surgery will NOT prevent you from ever having an infection again, so it usually only for patients who get infections 4 or more times yearly requiring antibiotics, or for infections that do not clear with antibiotics.  It will not cure nasal allergies, so patients with allergies may still require medication to treat their allergies after surgery. Surgery may improve headaches related to sinusitis, however, some people will continue to  require medication to control sinus headaches related to allergies.  Surgery will do nothing for other forms of headache (migraine, tension or cluster).  What Are the Risks of Endoscopic Sinus Surgery?  Current techniques allow surgery to be performed safely with little risk, however, there are rare complications that patients should be aware of.  Because the sinuses are located around the eyes, there is risk of eye injury, including blindness, though again, this would be quite rare. This is usually a result of bleeding behind the eye during surgery, which puts the vision oat risk, though there are treatments to protect the vision and prevent permanent disrupted by surgery causing a leak of the spinal fluid that surrounds the brain.  More serious complications would include bleeding inside the brain cavity or damage to the brain.  Again, all of these complications are uncommon, and spinal fluid leaks can be safely managed surgically if they occur.  The most common complication of sinus surgery is bleeding from the nose, which may require packing or cauterization of the nose.  Continued sinus have polyps may experience recurrence of the polyps requiring revision surgery.  Alterations of sense of smell or injury to the tear ducts are also rare complications.   What is the Surgery Like, and what is the Recovery?  The Surgery usually takes a couple of hours to perform, and is usually performed under a general anesthetic (completely asleep).  Patients are usually discharged home after a couple of hours.  Sometimes during surgery it is necessary to pack the nose to control bleeding, and the packing is left in place for 24 - 48 hours, and removed by your surgeon.    If a septoplasty was performed during the procedure, there is often a splint placed which must be removed after 5-7 days.   Discomfort: Pain is usually mild to moderate, and can be controlled by prescription pain medication or acetaminophen (Tylenol).   Aspirin, Ibuprofen (Advil, Motrin), or Naprosyn (Aleve) should be avoided, as they can cause increased bleeding.  Most patients feel sinus pressure like they have a bad head cold for several days.  Sleeping with your head elevated can help reduce swelling and facial pressure, as can ice packs over the face.  A humidifier may be helpful to keep the mucous and blood from drying in the nose.   Diet: There are no specific diet restrictions, however, you should generally start with clear liquids and a light diet of bland foods because the anesthetic can cause some nausea.  Advance your diet depending on how your stomach feels.  Taking your pain medication with food will often help reduce stomach upset which pain medications can cause.  Nasal Saline Irrigation: It is important to remove blood clots and dried mucous from the nose as it is healing.  This is done by having you irrigate the nose at least 3 - 4 times daily with a salt water solution.  We recommend using NeilMed Sinus Rinse (available at the drug store).  Fill the squeeze bottle with the solution, bend over a sink, and insert the tip of the squeeze bottle into the nose  of an inch.  Point the tip of the squeeze bottle towards the inside corner of the eye on the same side your irrigating.  Squeeze the bottle and gently irrigate the nose.  If you bend forward as you do this, most of the fluid will flow back out of the nose, instead of down your throat.   The solution should be warm, near body temperature, when you irrigate.   Each time you irrigate, you should use a full squeeze bottle.   Note that if you are instructed to use Nasal Steroid Sprays at any time after your surgery, irrigate with saline BEFORE using the steroid spray, so you do not wash it all out of the nose. Another product, Nasal Saline Gel (such as AYR Nasal Saline Gel) can be applied in each nostril 3 - 4 times daily to moisture the nose and reduce scabbing or crusting.  Bleeding:   Bloody drainage from the nose can be expected for several days, and patients are instructed to irrigate their nose frequently with salt water to help remove mucous and blood clots.  The drainage may be dark red or brown, though some fresh blood may be seen intermittently, especially after irrigation.  Do not blow you nose, as bleeding may occur. If you must sneeze, keep your mouth open to allow air to escape through your mouth.  If heavy bleeding occurs: Irrigate the nose with saline to rinse out clots, then spray the nose 3 - 4 times with Afrin Nasal Decongestant Spray.  The spray will constrict the blood vessels to slow bleeding.  Pinch the lower half of your nose shut to apply pressure, and lay down with your head elevated.  Ice packs over the nose may help as well. If bleeding persists despite these measures, you should notify your doctor.  Do not use the Afrin routinely to control nasal congestion after surgery, as it can result in worsening congestion and may affect healing.     Activity: Return to work varies among patients. Most patients will be   out of work at least 5 - 7 days to recover.  Patient may return to work after they are off of narcotic pain medication, and feeling well enough to perform the functions of their job.  Patients must avoid heavy lifting (over 10 pounds) or strenuous physical for 2 weeks after surgery, so your employer may need to assign you to light duty, or keep you out of work longer if light duty is not possible.  NOTE: you should not drive, operate dangerous machinery, do any mentally demanding tasks or make any important legal or financial decisions while on narcotic pain medication and recovering from the general anesthetic.    Call Your Doctor Immediately if You Have Any of the Following: 1. Bleeding that you cannot control with the above measures 2. Loss of vision, double vision, bulging of the eye or black eyes. 3. Fever over 101 degrees 4. Neck stiffness with  severe headache, fever, nausea and change in mental state. You are always encourage to call anytime with concerns, however, please call with requests for pain medication refills during office hours.  Office Endoscopy: During follow-up visits your doctor will remove any packing or splints that may have been placed and evaluate and clean your sinuses endoscopically.  Topical anesthetic will be used to make this as comfortable as possible, though you may want to take your pain medication prior to the visit.  How often this will need to be done varies from patient to patient.  After complete recovery from the surgery, you may need follow-up endoscopy from time to time, particularly if there is concern of recurrent infection or nasal polyps.  General Anesthesia, Adult, Care After This sheet gives you information about how to care for yourself after your procedure. Your health care provider may also give you more specific instructions. If you have problems or questions, contact your health care provider. What can I expect after the procedure? After the procedure, the following side effects are common:  Pain or discomfort at the IV site.  Nausea.  Vomiting.  Sore throat.  Trouble concentrating.  Feeling cold or chills.  Weak or tired.  Sleepiness and fatigue.  Soreness and body aches. These side effects can affect parts of the body that were not involved in surgery. Follow these instructions at home:  For at least 24 hours after the procedure:  Have a responsible adult stay with you. It is important to have someone help care for you until you are awake and alert.  Rest as needed.  Do not: ? Participate in activities in which you could fall or become injured. ? Drive. ? Use heavy machinery. ? Drink alcohol. ? Take sleeping pills or medicines that cause drowsiness. ? Make important decisions or sign legal documents. ? Take care of children on your own. Eating and drinking  Follow  any instructions from your health care provider about eating or drinking restrictions.  When you feel hungry, start by eating small amounts of foods that are soft and easy to digest (bland), such as toast. Gradually return to your regular diet.  Drink enough fluid to keep your urine pale yellow.  If you vomit, rehydrate by drinking water, juice, or clear broth. General instructions  If you have sleep apnea, surgery and certain medicines can increase your risk for breathing problems. Follow instructions from your health care provider about wearing your sleep device: ? Anytime you are sleeping, including during daytime naps. ? While taking prescription pain medicines, sleeping medicines, or medicines that   make you drowsy.  Return to your normal activities as told by your health care provider. Ask your health care provider what activities are safe for you.  Take over-the-counter and prescription medicines only as told by your health care provider.  If you smoke, do not smoke without supervision.  Keep all follow-up visits as told by your health care provider. This is important. Contact a health care provider if:  You have nausea or vomiting that does not get better with medicine.  You cannot eat or drink without vomiting.  You have pain that does not get better with medicine.  You are unable to pass urine.  You develop a skin rash.  You have a fever.  You have redness around your IV site that gets worse. Get help right away if:  You have difficulty breathing.  You have chest pain.  You have blood in your urine or stool, or you vomit blood. Summary  After the procedure, it is common to have a sore throat or nausea. It is also common to feel tired.  Have a responsible adult stay with you for the first 24 hours after general anesthesia. It is important to have someone help care for you until you are awake and alert.  When you feel hungry, start by eating small amounts of  foods that are soft and easy to digest (bland), such as toast. Gradually return to your regular diet.  Drink enough fluid to keep your urine pale yellow.  Return to your normal activities as told by your health care provider. Ask your health care provider what activities are safe for you. This information is not intended to replace advice given to you by your health care provider. Make sure you discuss any questions you have with your health care provider. Document Revised: 12/05/2017 Document Reviewed: 07/18/2017 Elsevier Patient Education  2020 [employer]  

## 2020-06-06 ENCOUNTER — Other Ambulatory Visit
Admission: RE | Admit: 2020-06-06 | Discharge: 2020-06-06 | Disposition: A | Discharge status: Home / Self Care | Payer: Medicare PPO | Source: Ambulatory Visit | Attending: Otolaryngology | Admitting: Otolaryngology

## 2020-06-06 ENCOUNTER — Other Ambulatory Visit: Payer: Self-pay

## 2020-06-06 DIAGNOSIS — Z20822 Contact with and (suspected) exposure to covid-19: Secondary | ICD-10-CM | POA: Diagnosis not present

## 2020-06-06 DIAGNOSIS — Z01812 Encounter for preprocedural laboratory examination: Secondary | ICD-10-CM | POA: Diagnosis present

## 2020-06-06 LAB — SARS CORONAVIRUS 2 (TAT 6-24 HRS): SARS Coronavirus 2: NEGATIVE

## 2020-06-08 ENCOUNTER — Ambulatory Visit: Payer: Medicare PPO | Admitting: Anesthesiology

## 2020-06-08 ENCOUNTER — Ambulatory Visit
Admission: RE | Admit: 2020-06-08 | Discharge: 2020-06-08 | Disposition: A | Discharge status: Home / Self Care | Payer: Medicare PPO | Attending: Otolaryngology | Admitting: Otolaryngology

## 2020-06-08 ENCOUNTER — Encounter
Admission: RE | Disposition: A | Discharge status: Home / Self Care | Payer: Self-pay | Source: Home / Self Care | Attending: Otolaryngology

## 2020-06-08 ENCOUNTER — Other Ambulatory Visit: Payer: Self-pay

## 2020-06-08 ENCOUNTER — Encounter: Payer: Self-pay | Admitting: Otolaryngology

## 2020-06-08 DIAGNOSIS — J321 Chronic frontal sinusitis: Secondary | ICD-10-CM | POA: Insufficient documentation

## 2020-06-08 DIAGNOSIS — K589 Irritable bowel syndrome without diarrhea: Secondary | ICD-10-CM | POA: Diagnosis not present

## 2020-06-08 DIAGNOSIS — K219 Gastro-esophageal reflux disease without esophagitis: Secondary | ICD-10-CM | POA: Insufficient documentation

## 2020-06-08 DIAGNOSIS — E785 Hyperlipidemia, unspecified: Secondary | ICD-10-CM | POA: Diagnosis not present

## 2020-06-08 DIAGNOSIS — E0590 Thyrotoxicosis, unspecified without thyrotoxic crisis or storm: Secondary | ICD-10-CM | POA: Insufficient documentation

## 2020-06-08 DIAGNOSIS — Z86711 Personal history of pulmonary embolism: Secondary | ICD-10-CM | POA: Insufficient documentation

## 2020-06-08 DIAGNOSIS — J322 Chronic ethmoidal sinusitis: Secondary | ICD-10-CM | POA: Diagnosis not present

## 2020-06-08 DIAGNOSIS — Z79899 Other long term (current) drug therapy: Secondary | ICD-10-CM | POA: Insufficient documentation

## 2020-06-08 DIAGNOSIS — I4891 Unspecified atrial fibrillation: Secondary | ICD-10-CM | POA: Diagnosis not present

## 2020-06-08 DIAGNOSIS — E119 Type 2 diabetes mellitus without complications: Secondary | ICD-10-CM | POA: Insufficient documentation

## 2020-06-08 DIAGNOSIS — Z7901 Long term (current) use of anticoagulants: Secondary | ICD-10-CM | POA: Diagnosis not present

## 2020-06-08 DIAGNOSIS — I10 Essential (primary) hypertension: Secondary | ICD-10-CM | POA: Insufficient documentation

## 2020-06-08 DIAGNOSIS — Z7984 Long term (current) use of oral hypoglycemic drugs: Secondary | ICD-10-CM | POA: Insufficient documentation

## 2020-06-08 DIAGNOSIS — J323 Chronic sphenoidal sinusitis: Secondary | ICD-10-CM | POA: Insufficient documentation

## 2020-06-08 DIAGNOSIS — J320 Chronic maxillary sinusitis: Secondary | ICD-10-CM | POA: Insufficient documentation

## 2020-06-08 HISTORY — PX: FRONTAL SINUS EXPLORATION: SHX6591

## 2020-06-08 HISTORY — PX: SPHENOIDECTOMY: SHX2421

## 2020-06-08 HISTORY — PX: IMAGE GUIDED SINUS SURGERY: SHX6570

## 2020-06-08 HISTORY — PX: ETHMOIDECTOMY: SHX5197

## 2020-06-08 HISTORY — DX: Cardiac arrhythmia, unspecified: I49.9

## 2020-06-08 HISTORY — PX: MAXILLARY ANTROSTOMY: SHX2003

## 2020-06-08 LAB — GLUCOSE, CAPILLARY
Glucose-Capillary: 101 mg/dL — ABNORMAL HIGH (ref 70–99)
Glucose-Capillary: 119 mg/dL — ABNORMAL HIGH (ref 70–99)

## 2020-06-08 SURGERY — SINUS SURGERY, WITH IMAGING GUIDANCE
Anesthesia: General | Site: Nose | Laterality: Bilateral

## 2020-06-08 MED ORDER — EPHEDRINE SULFATE 50 MG/ML IJ SOLN
INTRAMUSCULAR | Status: DC | PRN
  Administered 2020-06-08: 5 mg via INTRAVENOUS
  Administered 2020-06-08: 10 mg via INTRAVENOUS

## 2020-06-08 MED ORDER — LIDOCAINE HCL (CARDIAC) PF 100 MG/5ML IV SOSY
PREFILLED_SYRINGE | INTRAVENOUS | Status: DC | PRN
  Administered 2020-06-08: 50 mg via INTRAVENOUS

## 2020-06-08 MED ORDER — FENTANYL CITRATE (PF) 100 MCG/2ML IJ SOLN: 25.0000 ug | INTRAMUSCULAR | Status: DC | PRN

## 2020-06-08 MED ORDER — PHENYLEPHRINE HCL 0.5 % NA SOLN
NASAL | Status: DC | PRN
  Administered 2020-06-08: 30 mL via NASAL

## 2020-06-08 MED ORDER — MIDAZOLAM HCL 5 MG/5ML IJ SOLN
INTRAMUSCULAR | Status: DC | PRN
  Administered 2020-06-08: 1 mg via INTRAVENOUS

## 2020-06-08 MED ORDER — HYDROCODONE-ACETAMINOPHEN 5-325 MG PO TABS: 1.0000 | ORAL_TABLET | Freq: Four times a day (QID) | ORAL | 0 refills | Status: AC | PRN

## 2020-06-08 MED ORDER — PROPOFOL 10 MG/ML IV BOLUS
INTRAVENOUS | Status: DC | PRN
  Administered 2020-06-08: 120 mg via INTRAVENOUS

## 2020-06-08 MED ORDER — SUCCINYLCHOLINE CHLORIDE 20 MG/ML IJ SOLN
INTRAMUSCULAR | Status: DC | PRN
  Administered 2020-06-08: 100 mg via INTRAVENOUS

## 2020-06-08 MED ORDER — LIDOCAINE-EPINEPHRINE 1 %-1:100000 IJ SOLN
INTRAMUSCULAR | Status: DC | PRN
  Administered 2020-06-08: 3 mL

## 2020-06-08 MED ORDER — OXYCODONE HCL 5 MG PO TABS
5.0000 mg | ORAL_TABLET | Freq: Once | ORAL | Status: AC | PRN
  Administered 2020-06-08: 5 mg via ORAL

## 2020-06-08 MED ORDER — LACTATED RINGERS IV SOLN: INTRAVENOUS | Status: DC

## 2020-06-08 MED ORDER — PREDNISONE 10 MG PO TABS
ORAL_TABLET | ORAL | 0 refills | Status: DC
Start: 2020-06-08 — End: 2020-11-20

## 2020-06-08 MED ORDER — OXYCODONE HCL 5 MG/5ML PO SOLN: 5.0000 mg | Freq: Once | ORAL | Status: AC | PRN

## 2020-06-08 MED ORDER — ONDANSETRON HCL 4 MG/2ML IJ SOLN
INTRAMUSCULAR | Status: DC | PRN
  Administered 2020-06-08: 4 mg via INTRAVENOUS

## 2020-06-08 MED ORDER — DEXTROSE 5 % IV SOLN
2000.0000 mg | Freq: Once | INTRAVENOUS | Status: AC
  Administered 2020-06-08: 2000 mg via INTRAVENOUS

## 2020-06-08 MED ORDER — LIDOCAINE HCL 4 % MT SOLN
OROMUCOSAL | Status: DC | PRN
  Administered 2020-06-08: 4 mL via TOPICAL

## 2020-06-08 MED ORDER — DEXAMETHASONE SODIUM PHOSPHATE 4 MG/ML IJ SOLN
INTRAMUSCULAR | Status: DC | PRN
  Administered 2020-06-08: 10 mg via INTRAVENOUS

## 2020-06-08 MED ORDER — OXYMETAZOLINE HCL 0.05 % NA SOLN
2.0000 | Freq: Once | NASAL | Status: AC
  Administered 2020-06-08: 2 via NASAL

## 2020-06-08 MED ORDER — FENTANYL CITRATE (PF) 100 MCG/2ML IJ SOLN
INTRAMUSCULAR | Status: DC | PRN
  Administered 2020-06-08: 50 ug via INTRAVENOUS
  Administered 2020-06-08: 25 ug via INTRAVENOUS

## 2020-06-08 MED ORDER — GLYCOPYRROLATE 0.2 MG/ML IJ SOLN
INTRAMUSCULAR | Status: DC | PRN
  Administered 2020-06-08: .1 mg via INTRAVENOUS

## 2020-06-08 MED ORDER — CEPHALEXIN 500 MG PO CAPS: 500.0000 mg | ORAL_CAPSULE | Freq: Two times a day (BID) | ORAL | 0 refills | Status: DC

## 2020-06-08 SURGICAL SUPPLY — 30 items
BATTERY INSTRU NAVIGATION (MISCELLANEOUS) ×9 IMPLANT
CANISTER SUCT 1200ML W/VALVE (MISCELLANEOUS) ×3 IMPLANT
CATH IV 18X1 1/4 SAFELET (CATHETERS) ×3 IMPLANT
COAGULATOR SUCT 8FR VV (MISCELLANEOUS) ×3 IMPLANT
ELECT REM PT RETURN 9FT ADLT (ELECTROSURGICAL) ×3
ELECTRODE REM PT RTRN 9FT ADLT (ELECTROSURGICAL) ×1 IMPLANT
GLOVE PI ULTRA LF STRL 7.5 (GLOVE) ×2 IMPLANT
GLOVE PI ULTRA NON LATEX 7.5 (GLOVE) ×4
GOWN STRL REUS W/ TWL LRG LVL3 (GOWN DISPOSABLE) ×1 IMPLANT
GOWN STRL REUS W/TWL LRG LVL3 (GOWN DISPOSABLE) ×2
IV CATH 18X1 1/4 SAFELET (CATHETERS) ×1
IV NS 500ML (IV SOLUTION) ×2
IV NS 500ML BAXH (IV SOLUTION) ×1 IMPLANT
KIT TURNOVER KIT A (KITS) ×3 IMPLANT
NEEDLE ANESTHESIA  27G X 3.5 (NEEDLE) ×2
NEEDLE ANESTHESIA 27G X 3.5 (NEEDLE) ×1 IMPLANT
NEEDLE HYPO 27GX1-1/4 (NEEDLE) ×3 IMPLANT
NS IRRIG 500ML POUR BTL (IV SOLUTION) ×3 IMPLANT
PACK ENT CUSTOM (PACKS) ×3 IMPLANT
PACKING NASAL EPIS 4X2.4 XEROG (MISCELLANEOUS) ×9 IMPLANT
PATTIES SURGICAL .5 X3 (DISPOSABLE) ×3 IMPLANT
SHAVER DIEGO BLD STD TYPE A (BLADE) ×3 IMPLANT
SOL ANTI-FOG 6CC FOG-OUT (MISCELLANEOUS) ×1 IMPLANT
SOL FOG-OUT ANTI-FOG 6CC (MISCELLANEOUS) ×2
STRAP BODY AND KNEE 60X3 (MISCELLANEOUS) ×3 IMPLANT
SYR 3ML LL SCALE MARK (SYRINGE) ×3 IMPLANT
TOWEL OR 17X26 4PK STRL BLUE (TOWEL DISPOSABLE) ×3 IMPLANT
TRACKER CRANIALMASK (MASK) ×3 IMPLANT
TUBING DECLOG MULTIDEBRIDER (TUBING) ×3 IMPLANT
WATER STERILE IRR 250ML POUR (IV SOLUTION) ×3 IMPLANT

## 2020-06-08 NOTE — Anesthesia Postprocedure Evaluation (Signed)
Anesthesia Post Note  Patient: [name].  Procedure(s) Performed: IMAGE GUIDED SINUS SURGERY (Bilateral Nose) FRONTAL SINUS EXPLORATION (Bilateral Nose) TOTAL ETHMOIDECTOMY (Bilateral Nose) SPHENOIDECTOMY (Bilateral Nose) MAXILLARY ANTROSTOMY (Bilateral Nose)     Patient location during evaluation: PACU Anesthesia Type: General Level of consciousness: awake and alert Pain management: pain level controlled Vital Signs Assessment: post-procedure vital signs reviewed and stable Respiratory status: spontaneous breathing Cardiovascular status: stable Anesthetic complications: no   No complications documented.  Gillian Scarce

## 2020-06-08 NOTE — Op Note (Signed)
06/08/2020  10:58 AM    [name]  [id number]   Pre-Op Dx: Chronic bilateral maxillary sinusitis, chronic bilateral ethmoid sinusitis, chronic bilateral frontal sinusitis, chronic bilateral sphenoid sinusitis, previous partial sinus surgery  Post-op Dx: Same  Proc: Bilateral endoscopic maxillary antrostomies with removal of tissue, bilateral endoscopic total ethmoidectomy knees with sphenoid sinusotomies, bilateral endoscopic frontal sinusotomies, use of image guided system  Surg:  [name]  Anes:  GOT  EBL: 100 mL  Comp: None  Findings: Thickened mucous membranes and thickened bone overlying the maxillary antrums.  There is retained uncinate processes on both sides.  There was thickened mucous membranes in the posterior ethmoids that were not fully opened nor were the anterior ethmoids fully opened.  There is swelling around the frontal sinus duct and these were opened and widened on both sides.  The septum was relatively straight and not addressed.  Procedure: The patient was brought to the operating room and placed in supine position.  He was given general anesthesia by oral endotracheal intubation.  Once patient was asleep the nose was prepped using cottonoid pledgets soaked in phenylephrine and Xylocaine.  3 pledgets were placed on each side.  The image guided system was then brought in and the template was applied to the face.  The CT scan was downloaded from the disc and registered to the system.  The template was registered as well and there was 0.7 mm of variance.  The suction instruments were registered and these showed good alignment with the CT scan.  He was prepped and draped in sterile fashion.  The cotton pledgets were removed and the 0 and 30 degrees scopes were used to visualize the nasal airway on both sides.  See partial ethmoidectomies but thickened mucous membranes both sides.  There is no pus or significant drainage noted today.  The left side was addressed  first with infracturing of the middle turbinate to give better utilization of the ethmoid.  The uncinate process was removed on the left side to show the natural opening to the maxillary sinus.  This opening was widened posteriorly and inferiorly to make sure the sinus could drain well.  The 30 degree scope was used to visualize the natural ostium and some of the thickened mucous membranes anteriorly and superiorly her Haller cell were were removed.  The inferior portion maxillary sinus appeared to be relatively clear.  There is now a good opening for the maxillary antrum.  The 0 degree scope was then used for visualizing the posterior ethmoid air cells.  This was widened to give better visualization here.  There is a small opening into the left sphenoid sinus and this was widened as well a great better drainage in this area.  The 30 degree scope was then used to visualize the anterior ethmoid air cells.  The 45 degree forceps were used for opening up some of these air cells.  I could see the opening of the frontal sinus duct and this was widened using the frontal sinus instruments that were through biting.  This removed some ledges of bone and tissue that were creating narrower openings.  The 70 degree scope was used to visualize up into the frontal sinus and make sure that the duct was open and clear.  There is thickening of mucous membranes at the opening but once he got up into the sinus the mucous membranes were clear there.  This completed opening of all of the sinuses on the left side.  Cottonoid  pledgets were placed here for vasoconstriction while the right side was addressed.  The 0 degree scope was used to visualize the airway and the middle turbinate was outfractured.  Part of its anterior portion was previously removed.  There is still an uncinate process and scarring at the posterior and anterior ethmoid air cells.  A side biter was used to incise the uncinate process and this was completely removed  using the side biters and through biting forceps.  The Diego microdebrider was used again on this side also for trimming up some of the edges.  The maxillary antrum was widened posteriorly and inferiorly to provide better visualization maxillary sinus.  There is thickened mucous membranes inside the sinus along its medial wall and the superior wall.  Both these were cleaned to eliminate some chronic inflammatory disease.  A 30 and 70 degree scopes were used to visualize this area and the bottom of the sinus appeared to be fairly clear.  The 0 degree scope was then used again to open up the posterior ethmoid air cells.  The image guided system was used to evaluate depth of dissection.  Once the posterior ethmoids were completely open then the right sphenoid sinus was open.  Part of the party wall between the sphenoid and posterior ethmoid was removed.  This was a very large sinus.  The natural ostium to the sphenoid sinus had polypoid changes at it this was cleaned out to create a bigger natural opening.  A sphenoid punch was used to widen this as well.  The 30 degree scope was then used for visualizing the anterior ethmoid air cells and multiple air cells were removed.  The area was cleaned out and the frontal sinus duct could be seen.  There was a fairly large agar nasi cell just beneath it that was opened and cleared.  The opening to the frontal sinus duct had lots of thickened mucous membranes around it but I was able to get it a little more open than before.  There is no sign of purulence of the in the sinus.  The 70 degree scope was used to visualize the area along with the image guided to make sure that this was opened and widened as much as possible.  This completed opening up the sinuses on the right side.  Cottonoid pledgets were placed her temporarily.  The left side was revisualized and cotton pledget removed.  There was no significant bleeding.  The 30 and 70 degree scope were used to visualize the  frontal sinus ducts again and this was clear the maxillary sinus was clear the ethmoids were completely opened and the sphenoid sinus was clear.  Xerogel was placed into the sphenoid sinus opening and posterior ethmoid.  Xerogel was also placed into the anterior ethmoid at the frontal sinus duct.  Further xerogel was then placed to fill the middle and posterior ethmoid air cells.  The right side was then visualized again with the 30 and 70 degree scope.  Again the sinuses were opened and old blood was suctioned out.  There is no significant bleeding.  Xerogel was placed into the sphenoid sinus and posterior ethmoid and then into the anterior ethmoid and frontal sinus duct.  Further xerogel was placed along the maxillary antral opening and middle to posterior ethmoid air cells.  The inferior turbinates were outfractured some to make sure the airway remained open.  Septum was not addressed.  He had a good inferior airway on both sides with no  packing here or signs of obstruction.  Patient tolerated procedure well.  He was awakened and taken to the recovery room in satisfactory condition.  There were no operative complications.  Dispo:   To PACU to be discharged home  Plan: To follow-up in the office in 5 to 6 days for evaluation sinuses and cleaning out some of the gel.  We will start a saline flushes tomorrow and do those every 3-4 hours throughout the day.  He will keep his head elevated and rest through the weekend.  He will be on some antibiotics for prevention along with a small prednisone taper.  I have given him some Tylenol with hydrocodone 5/325 since he says he can tolerate this well and has used this in the past without problems.  If he is doing well he will just use Tylenol.  He has been off of his Eliquis and to restart that in 3 days.  [name]  06/08/2020 10:58 AM

## 2020-06-08 NOTE — H&P (Signed)
H&P has been reviewed and patient reevaluated, no changes necessary. To be downloaded later.  

## 2020-06-08 NOTE — Transfer of Care (Signed)
Immediate Anesthesia Transfer of Care Note  Patient: Kjell Brannen.  Procedure(s) Performed: IMAGE GUIDED SINUS SURGERY (Bilateral Nose) FRONTAL SINUS EXPLORATION (Bilateral Nose) TOTAL ETHMOIDECTOMY (Bilateral Nose) SPHENOIDECTOMY (Bilateral Nose) MAXILLARY ANTROSTOMY (Bilateral Nose)  Patient Location: PACU  Anesthesia Type: General ETT  Level of Consciousness: awake, alert  and patient cooperative  Airway and Oxygen Therapy: Patient Spontanous Breathing and Patient connected to supplemental oxygen  Post-op Assessment: Post-op Vital signs reviewed, Patient's Cardiovascular Status Stable, Respiratory Function Stable, Patent Airway and No signs of Nausea or vomiting  Post-op Vital Signs: Reviewed and stable  Complications: No complications documented.

## 2020-06-08 NOTE — Anesthesia Procedure Notes (Signed)
Procedure Name: Intubation Date/Time: 06/08/2020 8:58 AM Performed by: Mayme Genta, CRNA Pre-anesthesia Checklist: Patient identified, Emergency Drugs available, Suction available, Patient being monitored and Timeout performed Patient Re-evaluated:Patient Re-evaluated prior to induction Oxygen Delivery Method: Circle system utilized Preoxygenation: Pre-oxygenation with 100% oxygen Induction Type: IV induction Ventilation: Mask ventilation without difficulty Laryngoscope Size: Miller and 3 Grade View: Grade I Tube type: Oral Rae Tube size: 7.5 mm Number of attempts: 1 Placement Confirmation: ETT inserted through vocal cords under direct vision,  positive ETCO2 and breath sounds checked- equal and bilateral Tube secured with: Tape Dental Injury: Teeth and Oropharynx as per pre-operative assessment

## 2020-06-09 ENCOUNTER — Encounter: Payer: Self-pay | Admitting: Otolaryngology

## 2020-06-13 LAB — SURGICAL PATHOLOGY

## 2020-08-24 DIAGNOSIS — M1612 Unilateral primary osteoarthritis, left hip: Secondary | ICD-10-CM | POA: Insufficient documentation

## 2020-09-14 ENCOUNTER — Ambulatory Visit (INDEPENDENT_AMBULATORY_CARE_PROVIDER_SITE_OTHER): Payer: Medicare PPO | Admitting: Vascular Surgery

## 2020-09-14 ENCOUNTER — Encounter (INDEPENDENT_AMBULATORY_CARE_PROVIDER_SITE_OTHER): Payer: Self-pay | Admitting: Vascular Surgery

## 2020-09-14 ENCOUNTER — Other Ambulatory Visit: Payer: Self-pay

## 2020-09-14 VITALS — BP 146/90 | HR 93 | Ht 69.0 in | Wt 207.0 lb

## 2020-09-14 DIAGNOSIS — I2782 Chronic pulmonary embolism: Secondary | ICD-10-CM | POA: Diagnosis not present

## 2020-09-14 DIAGNOSIS — I10 Essential (primary) hypertension: Secondary | ICD-10-CM

## 2020-09-14 DIAGNOSIS — E119 Type 2 diabetes mellitus without complications: Secondary | ICD-10-CM

## 2020-09-14 DIAGNOSIS — M1612 Unilateral primary osteoarthritis, left hip: Secondary | ICD-10-CM | POA: Diagnosis not present

## 2020-09-14 DIAGNOSIS — K219 Gastro-esophageal reflux disease without esophagitis: Secondary | ICD-10-CM | POA: Insufficient documentation

## 2020-09-14 DIAGNOSIS — E785 Hyperlipidemia, unspecified: Secondary | ICD-10-CM | POA: Insufficient documentation

## 2020-09-14 DIAGNOSIS — I480 Paroxysmal atrial fibrillation: Secondary | ICD-10-CM | POA: Diagnosis not present

## 2020-09-15 ENCOUNTER — Encounter (INDEPENDENT_AMBULATORY_CARE_PROVIDER_SITE_OTHER): Payer: Self-pay | Admitting: Vascular Surgery

## 2020-09-15 DIAGNOSIS — I2782 Chronic pulmonary embolism: Secondary | ICD-10-CM | POA: Insufficient documentation
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
## 2020-09-28 ENCOUNTER — Telehealth (INDEPENDENT_AMBULATORY_CARE_PROVIDER_SITE_OTHER): Payer: Self-pay

## 2020-09-28 NOTE — Telephone Encounter (Signed)
Patient called back to say he misunderstood from his surgeon's office the time frame and is unable to do his procedure at this time and will call back when he is closer to having his surgery.

## 2020-09-28 NOTE — Telephone Encounter (Deleted)
Spoke with the patient and he is scheduled with Dr. Delana Meyer for a IVC filter placement on 10/04/20 with a 9:00 am arrival time to the [facility]. Covid testing on 10/02/20 between 8-1 pm a the [facility]. Pre-procedure instructions were discussed and will be mailed.

## 2020-10-13 ENCOUNTER — Other Ambulatory Visit (HOSPITAL_COMMUNITY): Payer: Self-pay | Admitting: Neurology

## 2020-10-13 ENCOUNTER — Other Ambulatory Visit: Payer: Self-pay | Admitting: Neurology

## 2020-10-13 DIAGNOSIS — G608 Other hereditary and idiopathic neuropathies: Secondary | ICD-10-CM

## 2020-10-13 DIAGNOSIS — R29898 Other symptoms and signs involving the musculoskeletal system: Secondary | ICD-10-CM

## 2020-10-27 ENCOUNTER — Ambulatory Visit
Admission: RE | Admit: 2020-10-27 | Discharge: 2020-10-27 | Disposition: A | Discharge status: Home / Self Care | Payer: Medicare PPO | Source: Ambulatory Visit | Attending: Neurology | Admitting: Neurology

## 2020-10-27 ENCOUNTER — Other Ambulatory Visit: Payer: Self-pay

## 2020-10-27 DIAGNOSIS — R29898 Other symptoms and signs involving the musculoskeletal system: Secondary | ICD-10-CM | POA: Insufficient documentation

## 2020-10-27 DIAGNOSIS — G608 Other hereditary and idiopathic neuropathies: Secondary | ICD-10-CM | POA: Diagnosis present

## 2020-11-06 ENCOUNTER — Telehealth (INDEPENDENT_AMBULATORY_CARE_PROVIDER_SITE_OTHER): Payer: Self-pay

## 2020-11-06 NOTE — Telephone Encounter (Signed)
Spoke with the patient and he is scheduled with Dr. Delana Meyer for a IVC filter insertion on 11/28/20 with a 6:45 am arrival time to the [facility]. Covid testing is on 11/24/20 between 8-1 pm at the [place]. Pre-procedure instructions were discussed and will be mailed.

## 2020-11-21 ENCOUNTER — Encounter: Payer: Self-pay | Admitting: Orthopedic Surgery

## 2020-11-21 NOTE — Discharge Instructions (Signed)
Instructions after Total Hip Replacement     Lemoine P. Phoenix Riesen, Jr., M.D.     Dept. of Orthopaedics & Sports Medicine  [facility]  [address]  [place], [place]  [address]  Phone: [phone]   Fax: [phone]    DIET: . Drink plenty of non-alcoholic fluids. . Resume your normal diet. Include foods high in fiber.  ACTIVITY:  . You may use crutches or a walker with weight-bearing as tolerated, unless instructed otherwise. . You may be weaned off of the walker or crutches by your Physical Therapist.  . Do NOT reach below the level of your knees or cross your legs until allowed.    . Continue doing gentle exercises. Exercising will reduce the pain and swelling, increase motion, and prevent muscle weakness.   . Please continue to use the TED compression stockings for 6 weeks. You may remove the stockings at night, but should reapply them in the morning. . Do not drive or operate any equipment until instructed.  WOUND CARE:  . Continue to use ice packs periodically to reduce pain and swelling. . Keep the incision clean and dry. . You may bathe or shower after the staples are removed at the first office visit following surgery.  MEDICATIONS: . You may resume your regular medications. . Please take the pain medication as prescribed on the medication. . Do not take pain medication on an empty stomach. . You have been given a prescription for a blood thinner to prevent blood clots. Please take the medication as instructed. (NOTE: After completing a 2 week course of Lovenox, take one Enteric-coated aspirin once a day.) . Pain medications and iron supplements can cause constipation. Use a stool softener (Senokot or Colace) on a daily basis and a laxative (dulcolax or miralax) as needed. . Do not drive or drink alcoholic beverages when taking pain medications.  CALL THE OFFICE FOR: . Temperature above 101 degrees . Excessive bleeding or drainage on the dressing. . Excessive  swelling, coldness, or paleness of the toes. . Persistent nausea and vomiting.  FOLLOW-UP:  . You should have an appointment to return to the office in 6 weeks after surgery. . Arrangements have been made for continuation of Physical Therapy (either home therapy or outpatient therapy).     [facility] Department Directory         [email or web address]       [email or web address]          Cardiology  Appointments: [place] - [phone] [place] - [phone]  Endocrinology  Appointments: [place] - [phone] [place] - [phone]  Gastroenterology  Appointments: [place] - [phone] [place] - [phone]        General Surgery   Appointments: [place] - [phone]  Internal Medicine/Family Medicine  Appointments: [place] - [phone] [place] - [phone] [place] - [phone]  Metabolic and Weigh Loss Surgery  Appointments: [place] - [phone]        Neurology  Appointments: [place] - [phone] [place] - [phone]  Neurosurgery  Appointments: [place] - [phone]  Obstetrics & Gynecology  Appointments: [place] - [phone] [place] - [phone]        Pediatrics  Appointments: [place] - [phone] [place] - [phone]  Physiatry  Appointments: [place] -[phone]  Physical Therapy  Appointments: [place] - [phone] [place] - [phone]        Podiatry  Appointments: [place] - [phone] [place] - [phone]  Pulmonology  Appointments: [place] - [phone]  Rheumatology  Appointments: [place] - [phone]        [place] Location: [facility]  [address] [place], [place]  [address]  [place] Location: [facility] [address] [place], [place]  [address]  [place] Location: [facility] [address] [place], [place]  [address]    

## 2020-11-23 ENCOUNTER — Encounter
Admission: RE | Admit: 2020-11-23 | Discharge: 2020-11-23 | Disposition: A | Discharge status: Home / Self Care | Payer: Medicare PPO | Source: Ambulatory Visit | Attending: Vascular Surgery | Admitting: Vascular Surgery

## 2020-11-23 ENCOUNTER — Other Ambulatory Visit: Payer: Self-pay

## 2020-11-23 ENCOUNTER — Encounter: Payer: Self-pay | Admitting: Urgent Care

## 2020-11-23 ENCOUNTER — Other Ambulatory Visit (INDEPENDENT_AMBULATORY_CARE_PROVIDER_SITE_OTHER): Payer: Self-pay | Admitting: Nurse Practitioner

## 2020-11-23 DIAGNOSIS — Z20822 Contact with and (suspected) exposure to covid-19: Secondary | ICD-10-CM | POA: Insufficient documentation

## 2020-11-23 DIAGNOSIS — Z01818 Encounter for other preprocedural examination: Secondary | ICD-10-CM | POA: Diagnosis not present

## 2020-11-23 LAB — URINALYSIS, ROUTINE W REFLEX MICROSCOPIC
Bilirubin Urine: NEGATIVE
Glucose, UA: NEGATIVE mg/dL
Hgb urine dipstick: NEGATIVE
Ketones, ur: NEGATIVE mg/dL
Leukocytes,Ua: NEGATIVE
Nitrite: NEGATIVE
Protein, ur: NEGATIVE mg/dL
Specific Gravity, Urine: 1.008 (ref 1.005–1.030)
pH: 5 (ref 5.0–8.0)

## 2020-11-23 LAB — CBC WITH DIFFERENTIAL/PLATELET
Abs Immature Granulocytes: 0.12 10*3/uL — ABNORMAL HIGH (ref 0.00–0.07)
Basophils Absolute: 0.3 10*3/uL — ABNORMAL HIGH (ref 0.0–0.1)
Basophils Relative: 2 %
Eosinophils Absolute: 1 10*3/uL — ABNORMAL HIGH (ref 0.0–0.5)
Eosinophils Relative: 10 %
HCT: 38.3 % — ABNORMAL LOW (ref 39.0–52.0)
Hemoglobin: 13.3 g/dL (ref 13.0–17.0)
Immature Granulocytes: 1 %
Lymphocytes Relative: 36 %
Lymphs Abs: 3.9 10*3/uL (ref 0.7–4.0)
MCH: 32.2 pg (ref 26.0–34.0)
MCHC: 34.7 g/dL (ref 30.0–36.0)
MCV: 92.7 fL (ref 80.0–100.0)
Monocytes Absolute: 1.4 10*3/uL — ABNORMAL HIGH (ref 0.1–1.0)
Monocytes Relative: 13 %
Neutro Abs: 4.2 10*3/uL (ref 1.7–7.7)
Neutrophils Relative %: 38 %
Platelets: 383 10*3/uL (ref 150–400)
RBC: 4.13 MIL/uL — ABNORMAL LOW (ref 4.22–5.81)
RDW: 15.3 % (ref 11.5–15.5)
WBC: 10.9 10*3/uL — ABNORMAL HIGH (ref 4.0–10.5)
nRBC: 0 % (ref 0.0–0.2)

## 2020-11-23 LAB — COMPREHENSIVE METABOLIC PANEL
ALT: 74 U/L — ABNORMAL HIGH (ref 0–44)
AST: 59 U/L — ABNORMAL HIGH (ref 15–41)
Albumin: 4.2 g/dL (ref 3.5–5.0)
Alkaline Phosphatase: 53 U/L (ref 38–126)
Anion gap: 10 (ref 5–15)
BUN: 15 mg/dL (ref 8–23)
CO2: 23 mmol/L (ref 22–32)
Calcium: 9.7 mg/dL (ref 8.9–10.3)
Chloride: 104 mmol/L (ref 98–111)
Creatinine, Ser: 0.68 mg/dL (ref 0.61–1.24)
GFR, Estimated: >60 mL/min (ref >60)
Glucose, Bld: 98 mg/dL (ref 70–99)
Potassium: 4.1 mmol/L (ref 3.5–5.1)
Sodium: 137 mmol/L (ref 135–145)
Total Bilirubin: 0.9 mg/dL (ref 0.3–1.2)
Total Protein: 7.6 g/dL (ref 6.5–8.1)

## 2020-11-23 LAB — SURGICAL PCR SCREEN
MRSA, PCR: NEGATIVE
Staphylococcus aureus: POSITIVE — AB

## 2020-11-23 LAB — TYPE AND SCREEN
ABO/RH(D): O POS
Antibody Screen: NEGATIVE

## 2020-11-23 LAB — APTT: aPTT: 29 seconds (ref 24–36)

## 2020-11-23 LAB — PROTIME-INR
INR: 1 (ref 0.8–1.2)
Prothrombin Time: 13 seconds (ref 11.4–15.2)

## 2020-11-23 LAB — SEDIMENTATION RATE: Sed Rate: 18 mm/hr (ref 0–20)

## 2020-11-23 LAB — HEMOGLOBIN A1C
Hgb A1c MFr Bld: 5.2 % (ref 4.8–5.6)
Mean Plasma Glucose: 102.54 mg/dL

## 2020-11-23 LAB — C-REACTIVE PROTEIN: CRP: 0.9 mg/dL (ref <1.0)

## 2020-11-23 NOTE — Patient Instructions (Signed)
COVID TESTING APPT ON 12/10 2021  FOR IVC FILTER INSERTION COVID TESTING APPT ON 11/30/2020  FOR HIP REPLACEMENT   ARRIVE AT MEDICAL MALL AT 6:45 11/28/2020 FOR IVC FILTER INSERTION Your procedure is scheduled on:  12/04/2020 Monday  Report to the Registration Desk on the 1st floor of the [place]. To find out your arrival time, please call [phone] between 1PM - 3PM on: December 01, 2020 FRIDAY  REMEMBER: Instructions that are not followed completely may result in serious medical risk, up to and including death; or upon the discretion of your surgeon and anesthesiologist your surgery may need to be rescheduled.  Do not eat food after midnight the night before surgery.  No gum chewing, lozengers or hard candies.  You may however, drink CLEAR liquids up to 2 hours before you are scheduled to arrive for your surgery. Do not drink anything within 2 hours of your scheduled arrival time.  Clear liquids include: - water   Do NOT drink anything that is not on this list.  Type 1 and Type 2 diabetics should only drink water.   TAKE THESE MEDICATIONS THE MORNING OF SURGERY WITH A SIP OF WATER: DILTIAZEM LYRICA PANTOPRAZOLE (take one the night before and one on the morning of surgery - helps to prevent nausea after surgery.)  USE FLONASE MORNING OF SURGERY  Stop Metformin  2 days prior to surgery. LAST DOSE 12/01/2020  Follow recommendations from Cardiologist, Pulmonologist or PCP regarding stopping  Coumadin, Plavix, Eliquis, Pradaxa, or Pletal. LAST DOSE OF ELIQUIS 11/30/2020(PT REPORTS THAT HE IS NOT TAKING ELIQUIS AT PRESENT PER HIS CHOICE)  One week prior to surgery: Stop Anti-inflammatories (NSAIDS) such as Advil, Aleve, Ibuprofen, Motrin, Naproxen, Naprosyn and ASPIRIN ORAspirin based products such as Excedrin, Goodys Powder, BC Powder. LAST DOSE November 26, 2020 Stop ANY OVER THE COUNTER supplements until after surgery. (However, you may continue taking Vitamin D,  Vitamin B, and multivitamin up until the day before surgery.)  No Alcohol for 24 hours before or after surgery.  No Smoking including e-cigarettes for 24 hours prior to surgery.  No chewable tobacco products for at least 6 hours prior to surgery.  No nicotine patches on the day of surgery.  Do not use any "recreational" drugs for at least a week prior to your surgery.  Please be advised that the combination of cocaine and anesthesia may have negative outcomes, up to and including death. If you test positive for cocaine, your surgery will be cancelled.  On the morning of surgery brush your teeth with toothpaste and water, you may rinse your mouth with mouthwash if you wish. Do not swallow any toothpaste or mouthwash.  Do not wear jewelry, make-up, hairpins, clips or nail polish.  Do not wear lotions, powders, or perfumes.   Do not shave body from the neck down 48 hours prior to surgery just in case you cut yourself which could leave a site for infection.  Also, freshly shaved skin may become irritated if using the CHG soap.  Contact lenses, hearing aids and dentures may not be worn into surgery.  Do not bring valuables to the hospital. [facility] is not responsible for any missing/lost belongings or valuables.   Use CHG Soap as directed on instruction sheet.  Notify your doctor if there is any change in your medical condition (cold, fever, infection).  Wear comfortable clothing (specific to your surgery type) to the hospital.  Plan for stool softeners for home use; pain medications have a  tendency to cause constipation. You can also help prevent constipation by eating foods high in fiber such as fruits and vegetables and drinking plenty of fluids as your diet allows.  After surgery, you can help prevent lung complications by doing breathing exercises.  Take deep breaths and cough every 1-2 hours. Your doctor may order a device called an Incentive Spirometer to help you take deep  breaths. When coughing or sneezing, hold a pillow firmly against your incision with both hands. This is called "splinting." Doing this helps protect your incision. It also decreases belly discomfort.  If you are being admitted to the hospital overnight, YOU MAY [place].   If you are being discharged the day of surgery, you will not be allowed to drive home. You will need a responsible adult (18 years or older) to drive you home and stay with you that night.   Please call the [place] Dept. at [phone] if you have any questions about these instructions.  Visitation Policy:  Patients undergoing a surgery or procedure may have one family member or support person with them as long as that person is not COVID-19 positive or experiencing its symptoms.  That person may remain in the waiting area during the procedure.  Inpatient Visitation Update:   In an effort to ensure the safety of our team members and our patients, we are implementing a change to our visitation policy:  Effective Monday, Aug. 9, at 7 a.m., inpatients will be allowed one support person.  o The support person may change daily.  o The support person must pass our screening, gel in and out, and wear a mask at all times, including in the patient's room.  o Patients must also wear a mask when staff or their support person are in the room.  o Masking is required regardless of vaccination status.  Systemwide, no visitors 17 or younger.

## 2020-11-24 ENCOUNTER — Other Ambulatory Visit
Admission: RE | Admit: 2020-11-24 | Discharge: 2020-11-24 | Disposition: A | Discharge status: Home / Self Care | Payer: Medicare PPO | Source: Ambulatory Visit | Attending: Vascular Surgery | Admitting: Vascular Surgery

## 2020-11-24 DIAGNOSIS — Z01818 Encounter for other preprocedural examination: Secondary | ICD-10-CM | POA: Diagnosis not present

## 2020-11-24 LAB — URINE CULTURE
Culture: NO GROWTH
Special Requests: NORMAL

## 2020-11-25 LAB — SARS CORONAVIRUS 2 (TAT 6-24 HRS): SARS Coronavirus 2: NEGATIVE

## 2020-11-28 ENCOUNTER — Ambulatory Visit
Admission: RE | Admit: 2020-11-28 | Discharge: 2020-11-28 | Disposition: A | Discharge status: Home / Self Care | Payer: Medicare PPO | Attending: Vascular Surgery | Admitting: Vascular Surgery

## 2020-11-28 ENCOUNTER — Encounter: Payer: Self-pay | Admitting: Vascular Surgery

## 2020-11-28 ENCOUNTER — Other Ambulatory Visit: Payer: Self-pay

## 2020-11-28 ENCOUNTER — Encounter
Admission: RE | Disposition: A | Discharge status: Home / Self Care | Payer: Self-pay | Source: Home / Self Care | Attending: Vascular Surgery

## 2020-11-28 DIAGNOSIS — Z86718 Personal history of other venous thrombosis and embolism: Secondary | ICD-10-CM

## 2020-11-28 DIAGNOSIS — M1612 Unilateral primary osteoarthritis, left hip: Secondary | ICD-10-CM | POA: Diagnosis not present

## 2020-11-28 DIAGNOSIS — Z882 Allergy status to sulfonamides status: Secondary | ICD-10-CM | POA: Diagnosis not present

## 2020-11-28 DIAGNOSIS — E119 Type 2 diabetes mellitus without complications: Secondary | ICD-10-CM | POA: Insufficient documentation

## 2020-11-28 DIAGNOSIS — Z885 Allergy status to narcotic agent status: Secondary | ICD-10-CM | POA: Diagnosis not present

## 2020-11-28 DIAGNOSIS — Z79899 Other long term (current) drug therapy: Secondary | ICD-10-CM | POA: Diagnosis not present

## 2020-11-28 DIAGNOSIS — Z7984 Long term (current) use of oral hypoglycemic drugs: Secondary | ICD-10-CM | POA: Diagnosis not present

## 2020-11-28 DIAGNOSIS — Z86711 Personal history of pulmonary embolism: Secondary | ICD-10-CM

## 2020-11-28 DIAGNOSIS — I480 Paroxysmal atrial fibrillation: Secondary | ICD-10-CM | POA: Diagnosis not present

## 2020-11-28 DIAGNOSIS — I82409 Acute embolism and thrombosis of unspecified deep veins of unspecified lower extremity: Secondary | ICD-10-CM

## 2020-11-28 DIAGNOSIS — I10 Essential (primary) hypertension: Secondary | ICD-10-CM | POA: Insufficient documentation

## 2020-11-28 DIAGNOSIS — Z888 Allergy status to other drugs, medicaments and biological substances status: Secondary | ICD-10-CM | POA: Diagnosis not present

## 2020-11-28 DIAGNOSIS — M25569 Pain in unspecified knee: Secondary | ICD-10-CM

## 2020-11-28 HISTORY — PX: IVC FILTER INSERTION: CATH118245

## 2020-11-28 LAB — GLUCOSE, CAPILLARY
Glucose-Capillary: 110 mg/dL — ABNORMAL HIGH (ref 70–99)
Glucose-Capillary: 107 mg/dL — ABNORMAL HIGH (ref 70–99)

## 2020-11-28 SURGERY — IVC FILTER INSERTION
Anesthesia: Moderate Sedation

## 2020-11-28 MED ORDER — MIDAZOLAM HCL 2 MG/2ML IJ SOLN
INTRAMUSCULAR | Status: DC | PRN
  Administered 2020-11-28: 2 mg via INTRAVENOUS
  Administered 2020-11-28: 1 mg via INTRAVENOUS

## 2020-11-28 MED ORDER — HEPARIN SODIUM (PORCINE) 1000 UNIT/ML IJ SOLN
INTRAMUSCULAR | Status: AC
  Filled 2020-11-28: qty 1

## 2020-11-28 MED ORDER — FENTANYL CITRATE (PF) 100 MCG/2ML IJ SOLN
INTRAMUSCULAR | Status: DC | PRN
  Administered 2020-11-28: 50 ug via INTRAVENOUS
  Administered 2020-11-28: 25 ug via INTRAVENOUS

## 2020-11-28 MED ORDER — MIDAZOLAM HCL 5 MG/5ML IJ SOLN
INTRAMUSCULAR | Status: AC
  Filled 2020-11-28: qty 5

## 2020-11-28 MED ORDER — DIPHENHYDRAMINE HCL 50 MG/ML IJ SOLN: 50.0000 mg | Freq: Once | INTRAMUSCULAR | Status: DC | PRN

## 2020-11-28 MED ORDER — ONDANSETRON HCL 4 MG/2ML IJ SOLN: 4.0000 mg | Freq: Four times a day (QID) | INTRAMUSCULAR | Status: DC | PRN

## 2020-11-28 MED ORDER — METHYLPREDNISOLONE SODIUM SUCC 125 MG IJ SOLR: 125.0000 mg | Freq: Once | INTRAMUSCULAR | Status: DC | PRN

## 2020-11-28 MED ORDER — FENTANYL CITRATE (PF) 100 MCG/2ML IJ SOLN: 12.5000 ug | Freq: Once | INTRAMUSCULAR | Status: DC | PRN

## 2020-11-28 MED ORDER — CEFAZOLIN SODIUM-DEXTROSE 2-4 GM/100ML-% IV SOLN
2.0000 g | Freq: Once | INTRAVENOUS | Status: AC
  Administered 2020-11-28: 09:00:00 2 g via INTRAVENOUS

## 2020-11-28 MED ORDER — FENTANYL CITRATE (PF) 100 MCG/2ML IJ SOLN
INTRAMUSCULAR | Status: AC
  Filled 2020-11-28: qty 2

## 2020-11-28 MED ORDER — SODIUM CHLORIDE 0.9 % IV SOLN: INTRAVENOUS | Status: DC

## 2020-11-28 MED ORDER — FAMOTIDINE 20 MG PO TABS: 40.0000 mg | ORAL_TABLET | Freq: Once | ORAL | Status: DC | PRN

## 2020-11-28 MED ORDER — MIDAZOLAM HCL 2 MG/ML PO SYRP: 8.0000 mg | ORAL_SOLUTION | Freq: Once | ORAL | Status: DC | PRN

## 2020-11-28 SURGICAL SUPPLY — 5 items
KIT FEMORAL DEL DENALI (Miscellaneous) ×3 IMPLANT
NEEDLE ENTRY 21GA 7CM ECHOTIP (NEEDLE) ×3 IMPLANT
PACK ANGIOGRAPHY (CUSTOM PROCEDURE TRAY) ×3 IMPLANT
SET INTRO CAPELLA COAXIAL (SET/KITS/TRAYS/PACK) ×3 IMPLANT
WIRE GUIDERIGHT .035X150 (WIRE) ×3 IMPLANT

## 2020-11-28 NOTE — Op Note (Signed)
[facility]   OPERATIVE NOTE    PRE-OPERATIVE DIAGNOSIS: Past history of DVT with PE; severe degenerative joint disease requiring joint replacement surgery this coming Monday  POST-OPERATIVE DIAGNOSIS: Same  PROCEDURE: 1.   Ultrasound guidance for vascular access to the right common femoral vein 2.   Catheter placement into the inferior vena cava 3.   Inferior venacavogram 4.   Placement of a Denali IVC filter  SURGEON: [name]  ASSISTANT(S): None  ANESTHESIA: Conscious sedation was administered by the interventional radiology RN under my direct supervision. IV Versed plus fentanyl were utilized. Continuous ECG, pulse oximetry and blood pressure was monitored throughout the entire procedure. Conscious sedation was for a total of 8 minutes and 47 seconds.  ESTIMATED BLOOD LOSS: minimal  FINDING(S): 1.  Patent IVC  SPECIMEN(S):  none  INDICATIONS:   [name]. is a 69 y.o. y.o. male who presents with need for joint replacement surgery.  He has a history of unprovoked DVT with PE.  Inferior vena cava filter is indicated for this reason.  Risks and benefits including filter thrombosis, migration, fracture, bleeding, and infection were all discussed.  We discussed that all IVC filters that we place can be removed if desired from the patient once the need for the filter has passed.    DESCRIPTION: After obtaining full informed written consent, the patient was brought back to the vascular suite. The skin was sterilely prepped and draped in a sterile surgical field was created. Ultrasound was placed in a sterile sleeve. The right common femoral vein was echolucent and compressible indicating patency. Image was recorded for the permanent record. The puncture was made under continuous real-time ultrasound guidance.  The right common femoral vein was accessed under direct ultrasound guidance without difficulty with a micropuncture needle. Microwire  was then advanced under fluoroscopic guidance without difficulty. Micro-sheath was then inserted and a J-wire was then placed. The dilator is passed over the wire and the delivery sheath was placed into the inferior vena cava.  Inferior venacavogram was performed. This demonstrated a patent IVC with the level of the renal veins at L2.  The filter was then deployed into the inferior vena cava at the level of L3 just below the renal veins. The delivery sheath was then removed. Pressure was held. Sterile dressings were placed. The patient tolerated the procedure well and was taken to the recovery room in stable condition.  Interpretation: IVC is opacified with bolus injection contrast.  Is widely patent.  There is reflux of contrast into both renal veins.  Cava measures 26 mm in diameter.  Denali filter is deployed at the L3 level in good orientation  COMPLICATIONS: None  CONDITION: Stable  [name]  11/28/2020, 8:48 AM

## 2020-11-28 NOTE — Discharge Instructions (Signed)
Moderate Conscious Sedation, Adult, Care After These instructions provide you with information about caring for yourself after your procedure. Your health care provider may also give you more specific instructions. Your treatment has been planned according to current medical practices, but problems sometimes occur. Call your health care provider if you have any problems or questions after your procedure. What can I expect after the procedure? After your procedure, it is common:  To feel sleepy for several hours.  To feel clumsy and have poor balance for several hours.  To have poor judgment for several hours.  To vomit if you eat too soon. Follow these instructions at home: For at least 24 hours after the procedure:   Do not: ? Participate in activities where you could fall or become injured. ? Drive. ? Use heavy machinery. ? Drink alcohol. ? Take sleeping pills or medicines that cause drowsiness. ? Make important decisions or sign legal documents. ? Take care of children on your own.  Rest. Eating and drinking  Follow the diet recommended by your health care provider.  If you vomit: ? Drink water, juice, or soup when you can drink without vomiting. ? Make sure you have little or no nausea before eating solid foods. General instructions  Have a responsible adult stay with you until you are awake and alert.  Take over-the-counter and prescription medicines only as told by your health care provider.  If you smoke, do not smoke without supervision.  Keep all follow-up visits as told by your health care provider. This is important. Contact a health care provider if:  You keep feeling nauseous or you keep vomiting.  You feel light-headed.  You develop a rash.  You have a fever. Get help right away if:  You have trouble breathing. This information is not intended to replace advice given to you by your health care provider. Make sure you discuss any questions you have  with your health care provider. Document Revised: 11/14/2017 Document Reviewed: 03/23/2016 Elsevier Patient Education  [place]. Inferior Vena Cava Filter Insertion, Care After This sheet gives you information about how to care for yourself after your procedure. Your health care provider may also give you more specific instructions. If you have problems or questions, contact your health care provider. What can I expect after the procedure? After your procedure, it is common to have:  Mild pain in the area where the filter was inserted.  Mild bruising in the area where the filter was inserted. Follow these instructions at home: Insertion site care   Follow instructions from your health care provider about how to take care of the site where a catheter was inserted at your neck or groin (insertion site). Make sure you: ? Wash your hands with soap and water before you change your bandage (dressing). If soap and water are not available, use hand sanitizer. ? Change your dressing as told by your health care provider.  Check your insertion site every day for signs of infection. Check for: ? More redness, swelling, or pain. ? More fluid or blood. ? Warmth. ? Pus or a bad smell.  Keep the insertion site clean and dry.  Do not shower, bathe, use a hot tub, or let the dressing get wet until your health care provider approves. General instructions  Take over-the-counter and prescription medicines only as told by your health care provider.  Avoid heavy lifting or hard activities for 48 hours after the procedure or as told by your health care  provider.  Do not drive for 24 hours if you were given a medicine to help you relax (sedative).  Do not drive or use heavy machinery while taking prescription pain medicine.  Do not go back to school or work until your health care provider approves.  Keep all follow-up visits as told by your health care provider. This is important. Contact a  health care provider if:  You have more redness, swelling, or pain around your insertion site.  You have more fluid or blood coming from your insertion site.  Your insertion site feels warm to the touch.  You have pus or a bad smell coming from your insertion site.  You have a fever.  You are dizzy.  You have nausea and vomiting.  You develop a rash. Get help right away if:  You develop chest pain, a cough, or difficulty breathing.  You develop shortness of breath, feel faint, or pass out.  You cough up blood.  You have severe pain in your abdomen.  You develop swelling and discoloration or pain in your legs.  Your legs become pale and cold or blue.  You develop weakness, difficulty moving your arms or legs, or balance problems.  You develop problems with speech or vision. These symptoms may represent a serious problem that is an emergency. Do not wait to see if the symptoms will go away. Get medical help right away. Call your local emergency services (911 in the [place] Do not drive yourself to the hospital. Summary  After your insertion procedure, it is common to have mild pain and bruising.  Do not shower, bathe, use a hot tub, or let the dressing get wet until your health care provider approves.  Every day, check for signs of infection where a catheter was inserted at your neck or groin (insertion site). This information is not intended to replace advice given to you by your health care provider. Make sure you discuss any questions you have with your health care provider. Document Revised: 11/14/2017 Document Reviewed: 10/23/2016 Elsevier Patient Education  2020 [employer].
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
## 2020-11-28 NOTE — Interval H&P Note (Signed)
History and Physical Interval Note:  11/28/2020 8:14 AM  [name].  has presented today for surgery, with the diagnosis of IVC Filter Insertion   DVT   Pt to have Covid test on 11-24-20.  The various methods of treatment have been discussed with the patient and family. After consideration of risks, benefits and other options for treatment, the patient has consented to  Procedure(s): IVC FILTER INSERTION (N/A) as a surgical intervention.  The patient's history has been reviewed, patient examined, no change in status, stable for surgery.  I have reviewed the patient's chart and labs.  Questions were answered to the patient's satisfaction.     [name]

## 2020-11-30 ENCOUNTER — Other Ambulatory Visit: Payer: Self-pay

## 2020-11-30 ENCOUNTER — Other Ambulatory Visit
Admission: RE | Admit: 2020-11-30 | Discharge: 2020-11-30 | Disposition: A | Discharge status: Home / Self Care | Payer: Medicare PPO | Source: Ambulatory Visit | Attending: Orthopedic Surgery | Admitting: Orthopedic Surgery

## 2020-11-30 DIAGNOSIS — Z20822 Contact with and (suspected) exposure to covid-19: Secondary | ICD-10-CM | POA: Diagnosis not present

## 2020-11-30 DIAGNOSIS — Z01812 Encounter for preprocedural laboratory examination: Secondary | ICD-10-CM | POA: Diagnosis present

## 2020-11-30 LAB — SARS CORONAVIRUS 2 (TAT 6-24 HRS): SARS Coronavirus 2: NEGATIVE

## 2020-12-03 MED ORDER — ORAL CARE MOUTH RINSE: 15.0000 mL | Freq: Once | OROMUCOSAL | Status: AC

## 2020-12-03 MED ORDER — DEXAMETHASONE SODIUM PHOSPHATE 10 MG/ML IJ SOLN: 8.0000 mg | Freq: Once | INTRAMUSCULAR | Status: AC

## 2020-12-03 MED ORDER — SODIUM CHLORIDE 0.9 % IV SOLN: INTRAVENOUS | Status: DC

## 2020-12-03 MED ORDER — TRANEXAMIC ACID-NACL 1000-0.7 MG/100ML-% IV SOLN
1000.0000 mg | INTRAVENOUS | Status: AC
  Administered 2020-12-04: 08:00:00 1000 mg via INTRAVENOUS

## 2020-12-03 MED ORDER — GABAPENTIN 300 MG PO CAPS: 300.0000 mg | ORAL_CAPSULE | Freq: Once | ORAL | Status: DC

## 2020-12-03 MED ORDER — CELECOXIB 200 MG PO CAPS: 400.0000 mg | ORAL_CAPSULE | Freq: Once | ORAL | Status: AC

## 2020-12-03 MED ORDER — CEFAZOLIN SODIUM-DEXTROSE 2-4 GM/100ML-% IV SOLN
2.0000 g | INTRAVENOUS | Status: AC
  Administered 2020-12-04: 08:00:00 2 g via INTRAVENOUS

## 2020-12-03 MED ORDER — CHLORHEXIDINE GLUCONATE 0.12 % MT SOLN: 15.0000 mL | Freq: Once | OROMUCOSAL | Status: AC

## 2020-12-03 MED ORDER — CHLORHEXIDINE GLUCONATE 4 % EX LIQD: 60.0000 mL | Freq: Once | CUTANEOUS | Status: DC

## 2020-12-04 ENCOUNTER — Inpatient Hospital Stay: Payer: Medicare PPO | Admitting: Anesthesiology

## 2020-12-04 ENCOUNTER — Encounter: Payer: Self-pay | Admitting: Orthopedic Surgery

## 2020-12-04 ENCOUNTER — Inpatient Hospital Stay: Payer: Medicare PPO

## 2020-12-04 ENCOUNTER — Encounter
Admission: RE | Disposition: A | Discharge status: Home Health | Payer: Self-pay | Source: Home / Self Care | Attending: Orthopedic Surgery

## 2020-12-04 ENCOUNTER — Inpatient Hospital Stay
Admission: RE | Admit: 2020-12-04 | Discharge: 2020-12-06 | DRG: 470 | Disposition: A | Discharge status: Home Health | Payer: Medicare PPO | Attending: Orthopedic Surgery | Admitting: Orthopedic Surgery

## 2020-12-04 ENCOUNTER — Other Ambulatory Visit: Payer: Self-pay

## 2020-12-04 DIAGNOSIS — Z96642 Presence of left artificial hip joint: Secondary | ICD-10-CM

## 2020-12-04 DIAGNOSIS — Z86711 Personal history of pulmonary embolism: Secondary | ICD-10-CM | POA: Diagnosis not present

## 2020-12-04 DIAGNOSIS — Z79899 Other long term (current) drug therapy: Secondary | ICD-10-CM | POA: Diagnosis not present

## 2020-12-04 DIAGNOSIS — Z7984 Long term (current) use of oral hypoglycemic drugs: Secondary | ICD-10-CM | POA: Diagnosis not present

## 2020-12-04 DIAGNOSIS — Z20822 Contact with and (suspected) exposure to covid-19: Secondary | ICD-10-CM | POA: Diagnosis present

## 2020-12-04 DIAGNOSIS — Z9081 Acquired absence of spleen: Secondary | ICD-10-CM

## 2020-12-04 DIAGNOSIS — Z85828 Personal history of other malignant neoplasm of skin: Secondary | ICD-10-CM | POA: Diagnosis not present

## 2020-12-04 DIAGNOSIS — I480 Paroxysmal atrial fibrillation: Secondary | ICD-10-CM | POA: Diagnosis present

## 2020-12-04 DIAGNOSIS — E785 Hyperlipidemia, unspecified: Secondary | ICD-10-CM | POA: Diagnosis present

## 2020-12-04 DIAGNOSIS — M1612 Unilateral primary osteoarthritis, left hip: Secondary | ICD-10-CM | POA: Diagnosis present

## 2020-12-04 DIAGNOSIS — I2510 Atherosclerotic heart disease of native coronary artery without angina pectoris: Secondary | ICD-10-CM | POA: Diagnosis present

## 2020-12-04 DIAGNOSIS — E119 Type 2 diabetes mellitus without complications: Secondary | ICD-10-CM | POA: Diagnosis present

## 2020-12-04 DIAGNOSIS — Z7982 Long term (current) use of aspirin: Secondary | ICD-10-CM

## 2020-12-04 DIAGNOSIS — I10 Essential (primary) hypertension: Secondary | ICD-10-CM | POA: Diagnosis present

## 2020-12-04 DIAGNOSIS — K219 Gastro-esophageal reflux disease without esophagitis: Secondary | ICD-10-CM | POA: Diagnosis present

## 2020-12-04 DIAGNOSIS — Z96649 Presence of unspecified artificial hip joint: Secondary | ICD-10-CM

## 2020-12-04 HISTORY — DX: Unspecified osteoarthritis, unspecified site: M19.90

## 2020-12-04 HISTORY — PX: TOTAL HIP ARTHROPLASTY: SHX124

## 2020-12-04 LAB — GLUCOSE, CAPILLARY
Glucose-Capillary: 123 mg/dL — ABNORMAL HIGH (ref 70–99)
Glucose-Capillary: 147 mg/dL — ABNORMAL HIGH (ref 70–99)
Glucose-Capillary: 191 mg/dL — ABNORMAL HIGH (ref 70–99)
Glucose-Capillary: 164 mg/dL — ABNORMAL HIGH (ref 70–99)

## 2020-12-04 LAB — ABO/RH: ABO/RH(D): O POS

## 2020-12-04 SURGERY — ARTHROPLASTY, HIP, TOTAL,POSTERIOR APPROACH
Anesthesia: Spinal | Site: Hip | Laterality: Left

## 2020-12-04 MED ORDER — SENNOSIDES-DOCUSATE SODIUM 8.6-50 MG PO TABS
1.0000 | ORAL_TABLET | Freq: Two times a day (BID) | ORAL | Status: DC
  Administered 2020-12-04 – 2020-12-06 (×5): 1 via ORAL
  Filled 2020-12-04 (×5): qty 1

## 2020-12-04 MED ORDER — TRANEXAMIC ACID-NACL 1000-0.7 MG/100ML-% IV SOLN: 1000.0000 mg | Freq: Once | INTRAVENOUS | Status: DC

## 2020-12-04 MED ORDER — DILTIAZEM HCL 30 MG PO TABS
30.0000 mg | ORAL_TABLET | Freq: Two times a day (BID) | ORAL | Status: DC
  Administered 2020-12-04 – 2020-12-06 (×4): 30 mg via ORAL
  Filled 2020-12-04 (×4): qty 1

## 2020-12-04 MED ORDER — METOCLOPRAMIDE HCL 10 MG PO TABS
10.0000 mg | ORAL_TABLET | Freq: Three times a day (TID) | ORAL | Status: DC
  Administered 2020-12-04 – 2020-12-06 (×7): 10 mg via ORAL
  Filled 2020-12-04 (×7): qty 1

## 2020-12-04 MED ORDER — OXYCODONE HCL 5 MG PO TABS
5.0000 mg | ORAL_TABLET | ORAL | Status: DC | PRN
  Administered 2020-12-04: 5 mg via ORAL
  Administered 2020-12-05 (×2): 10 mg via ORAL
  Administered 2020-12-05: 5 mg via ORAL
  Administered 2020-12-05 – 2020-12-06 (×2): 10 mg via ORAL
  Administered 2020-12-06: 5 mg via ORAL
  Filled 2020-12-04: qty 1
  Filled 2020-12-04 (×6): qty 2

## 2020-12-04 MED ORDER — ONDANSETRON HCL 4 MG/2ML IJ SOLN: 4.0000 mg | Freq: Four times a day (QID) | INTRAMUSCULAR | Status: DC | PRN

## 2020-12-04 MED ORDER — LEVOCETIRIZINE DIHYDROCHLORIDE 5 MG PO TABS: 5.0000 mg | ORAL_TABLET | Freq: Every day | ORAL | Status: DC

## 2020-12-04 MED ORDER — PHENOL 1.4 % MT LIQD
1.0000 | OROMUCOSAL | Status: DC | PRN
  Filled 2020-12-04: qty 177

## 2020-12-04 MED ORDER — BUPIVACAINE HCL (PF) 0.5 % IJ SOLN
INTRAMUSCULAR | Status: DC | PRN
  Administered 2020-12-04: 3 mL

## 2020-12-04 MED ORDER — SODIUM CHLORIDE 0.9 % IV SOLN: INTRAVENOUS | Status: DC

## 2020-12-04 MED ORDER — FENTANYL CITRATE (PF) 100 MCG/2ML IJ SOLN
25.0000 ug | INTRAMUSCULAR | Status: DC | PRN
  Administered 2020-12-04: 25 ug via INTRAVENOUS

## 2020-12-04 MED ORDER — FENTANYL CITRATE (PF) 100 MCG/2ML IJ SOLN
INTRAMUSCULAR | Status: AC
  Filled 2020-12-04: qty 2

## 2020-12-04 MED ORDER — ONDANSETRON HCL 4 MG/2ML IJ SOLN
INTRAMUSCULAR | Status: DC | PRN
  Administered 2020-12-04 (×2): 4 mg via INTRAVENOUS

## 2020-12-04 MED ORDER — ACETAMINOPHEN 10 MG/ML IV SOLN
1000.0000 mg | Freq: Four times a day (QID) | INTRAVENOUS | Status: AC
  Administered 2020-12-04 – 2020-12-05 (×3): 1000 mg via INTRAVENOUS
  Filled 2020-12-04 (×4): qty 100

## 2020-12-04 MED ORDER — FERROUS SULFATE 325 (65 FE) MG PO TABS
325.0000 mg | ORAL_TABLET | Freq: Two times a day (BID) | ORAL | Status: DC
  Administered 2020-12-04 – 2020-12-06 (×4): 325 mg via ORAL
  Filled 2020-12-04 (×4): qty 1

## 2020-12-04 MED ORDER — CYANOCOBALAMIN 500 MCG PO TABS
500.0000 ug | ORAL_TABLET | Freq: Every day | ORAL | Status: DC
  Administered 2020-12-04 – 2020-12-06 (×3): 500 ug via ORAL
  Filled 2020-12-04 (×3): qty 1

## 2020-12-04 MED ORDER — ADULT MULTIVITAMIN W/MINERALS CH
1.0000 | ORAL_TABLET | Freq: Every day | ORAL | Status: DC
  Administered 2020-12-04 – 2020-12-06 (×3): 1 via ORAL
  Filled 2020-12-04 (×3): qty 1

## 2020-12-04 MED ORDER — KETOTIFEN FUMARATE 0.025 % OP SOLN
1.0000 [drp] | Freq: Every day | OPHTHALMIC | Status: DC | PRN
  Filled 2020-12-04: qty 5

## 2020-12-04 MED ORDER — INSULIN ASPART 100 UNIT/ML ~~LOC~~ SOLN
0.0000 [IU] | Freq: Three times a day (TID) | SUBCUTANEOUS | Status: DC
  Administered 2020-12-04: 17:00:00 3 [IU] via SUBCUTANEOUS
  Administered 2020-12-05 (×2): 2 [IU] via SUBCUTANEOUS
  Filled 2020-12-04 (×3): qty 1

## 2020-12-04 MED ORDER — BISACODYL 10 MG RE SUPP
10.0000 mg | Freq: Every day | RECTAL | Status: DC | PRN
Start: 2020-12-04 — End: 2020-12-06
  Administered 2020-12-06: 05:00:00 10 mg via RECTAL
  Filled 2020-12-04: qty 1

## 2020-12-04 MED ORDER — MAGNESIUM OXIDE 400 (241.3 MG) MG PO TABS
400.0000 mg | ORAL_TABLET | Freq: Two times a day (BID) | ORAL | Status: DC
  Administered 2020-12-04 – 2020-12-05 (×4): 400 mg via ORAL
  Filled 2020-12-04 (×5): qty 1

## 2020-12-04 MED ORDER — ONDANSETRON HCL 4 MG/2ML IJ SOLN: 4.0000 mg | Freq: Once | INTRAMUSCULAR | Status: DC | PRN

## 2020-12-04 MED ORDER — ALUM & MAG HYDROXIDE-SIMETH 200-200-20 MG/5ML PO SUSP: 30.0000 mL | ORAL | Status: DC | PRN

## 2020-12-04 MED ORDER — PROPOFOL 500 MG/50ML IV EMUL
INTRAVENOUS | Status: AC
  Filled 2020-12-04: qty 50

## 2020-12-04 MED ORDER — PROMETHAZINE HCL 25 MG/ML IJ SOLN
6.2500 mg | INTRAMUSCULAR | Status: DC | PRN
  Administered 2020-12-04: 11:00:00 6.25 mg via INTRAVENOUS

## 2020-12-04 MED ORDER — POTASSIUM 99 MG PO TABS: 198.0000 mg | ORAL_TABLET | Freq: Every day | ORAL | Status: DC

## 2020-12-04 MED ORDER — ACETAMINOPHEN 325 MG PO TABS
325.0000 mg | ORAL_TABLET | Freq: Four times a day (QID) | ORAL | Status: DC | PRN
  Administered 2020-12-05: 650 mg via ORAL
  Filled 2020-12-04: qty 2

## 2020-12-04 MED ORDER — LOSARTAN POTASSIUM 25 MG PO TABS
25.0000 mg | ORAL_TABLET | Freq: Every day | ORAL | Status: DC
Start: 2020-12-04 — End: 2020-12-06
  Administered 2020-12-04 – 2020-12-06 (×3): 25 mg via ORAL
  Filled 2020-12-04 (×3): qty 1

## 2020-12-04 MED ORDER — FENTANYL CITRATE (PF) 100 MCG/2ML IJ SOLN
INTRAMUSCULAR | Status: DC | PRN
  Administered 2020-12-04 (×2): 50 ug via INTRAVENOUS

## 2020-12-04 MED ORDER — SODIUM CHLORIDE 0.9 % IV SOLN
INTRAVENOUS | Status: DC | PRN
  Administered 2020-12-04: 08:00:00 25 ug/min via INTRAVENOUS

## 2020-12-04 MED ORDER — B COMPLEX-C PO TABS
1.0000 | ORAL_TABLET | Freq: Every day | ORAL | Status: DC
  Administered 2020-12-04 – 2020-12-05 (×2): 1 via ORAL
  Filled 2020-12-04 (×3): qty 1

## 2020-12-04 MED ORDER — CALCIUM POLYCARBOPHIL 625 MG PO TABS
625.0000 mg | ORAL_TABLET | Freq: Every day | ORAL | Status: DC
  Administered 2020-12-04 – 2020-12-06 (×3): 625 mg via ORAL
  Filled 2020-12-04 (×3): qty 1

## 2020-12-04 MED ORDER — CELECOXIB 200 MG PO CAPS
200.0000 mg | ORAL_CAPSULE | Freq: Two times a day (BID) | ORAL | Status: DC
  Administered 2020-12-04 – 2020-12-06 (×5): 200 mg via ORAL
  Filled 2020-12-04 (×5): qty 1

## 2020-12-04 MED ORDER — DILTIAZEM HCL ER COATED BEADS 120 MG PO CP24
120.0000 mg | ORAL_CAPSULE | Freq: Every day | ORAL | Status: DC
  Administered 2020-12-05 – 2020-12-06 (×2): 120 mg via ORAL
  Filled 2020-12-04 (×2): qty 1

## 2020-12-04 MED ORDER — ROCURONIUM BROMIDE 100 MG/10ML IV SOLN
INTRAVENOUS | Status: DC | PRN
  Administered 2020-12-04: 30 mg via INTRAVENOUS
  Administered 2020-12-04: 50 mg via INTRAVENOUS

## 2020-12-04 MED ORDER — CELECOXIB 200 MG PO CAPS
ORAL_CAPSULE | ORAL | Status: AC
  Administered 2020-12-04: 07:00:00 400 mg via ORAL
  Filled 2020-12-04: qty 1

## 2020-12-04 MED ORDER — POTASSIUM CHLORIDE CRYS ER 10 MEQ PO TBCR
10.0000 meq | EXTENDED_RELEASE_TABLET | ORAL | Status: DC
  Administered 2020-12-05: 09:00:00 10 meq via ORAL
  Filled 2020-12-04: qty 1

## 2020-12-04 MED ORDER — MIDAZOLAM HCL 5 MG/5ML IJ SOLN
INTRAMUSCULAR | Status: DC | PRN
  Administered 2020-12-04: 2 mg via INTRAVENOUS

## 2020-12-04 MED ORDER — CEFAZOLIN SODIUM-DEXTROSE 2-4 GM/100ML-% IV SOLN
2.0000 g | Freq: Four times a day (QID) | INTRAVENOUS | Status: AC
  Administered 2020-12-04: 20:00:00 2 g via INTRAVENOUS
  Filled 2020-12-04 (×2): qty 100

## 2020-12-04 MED ORDER — ACETAMINOPHEN 10 MG/ML IV SOLN
INTRAVENOUS | Status: DC | PRN
  Administered 2020-12-04: 1000 mg via INTRAVENOUS

## 2020-12-04 MED ORDER — ENSURE PRE-SURGERY PO LIQD
296.0000 mL | Freq: Once | ORAL | Status: DC
  Filled 2020-12-04: qty 296

## 2020-12-04 MED ORDER — PROPOFOL 10 MG/ML IV BOLUS
INTRAVENOUS | Status: DC | PRN
  Administered 2020-12-04: 50 mg via INTRAVENOUS
  Administered 2020-12-04: 150 mg via INTRAVENOUS

## 2020-12-04 MED ORDER — DEXAMETHASONE SODIUM PHOSPHATE 10 MG/ML IJ SOLN
INTRAMUSCULAR | Status: AC
  Administered 2020-12-04: 07:00:00 8 mg via INTRAVENOUS
  Filled 2020-12-04: qty 1

## 2020-12-04 MED ORDER — TRANEXAMIC ACID-NACL 1000-0.7 MG/100ML-% IV SOLN: 1000.0000 mg | Freq: Once | INTRAVENOUS | Status: AC

## 2020-12-04 MED ORDER — VITAMIN D 25 MCG (1000 UNIT) PO TABS
4000.0000 [IU] | ORAL_TABLET | Freq: Every day | ORAL | Status: DC
  Administered 2020-12-04 – 2020-12-06 (×3): 4000 [IU] via ORAL
  Filled 2020-12-04 (×3): qty 4

## 2020-12-04 MED ORDER — TIZANIDINE HCL 2 MG PO TABS
2.0000 mg | ORAL_TABLET | Freq: Every day | ORAL | Status: DC
  Administered 2020-12-04 – 2020-12-05 (×2): 2 mg via ORAL
  Filled 2020-12-04 (×3): qty 1

## 2020-12-04 MED ORDER — MIDAZOLAM HCL 2 MG/2ML IJ SOLN
INTRAMUSCULAR | Status: AC
  Filled 2020-12-04: qty 2

## 2020-12-04 MED ORDER — ACETAMINOPHEN 10 MG/ML IV SOLN
INTRAVENOUS | Status: AC
  Filled 2020-12-04: qty 100

## 2020-12-04 MED ORDER — LORATADINE 10 MG PO TABS
10.0000 mg | ORAL_TABLET | Freq: Every day | ORAL | Status: DC
  Administered 2020-12-04 – 2020-12-05 (×2): 10 mg via ORAL
  Filled 2020-12-04 (×2): qty 1

## 2020-12-04 MED ORDER — TRANEXAMIC ACID-NACL 1000-0.7 MG/100ML-% IV SOLN
INTRAVENOUS | Status: AC
  Administered 2020-12-04: 11:00:00 1000 mg via INTRAVENOUS
  Filled 2020-12-04: qty 100

## 2020-12-04 MED ORDER — ONDANSETRON HCL 4 MG PO TABS: 4.0000 mg | ORAL_TABLET | Freq: Four times a day (QID) | ORAL | Status: DC | PRN

## 2020-12-04 MED ORDER — GABAPENTIN 300 MG PO CAPS
ORAL_CAPSULE | ORAL | Status: AC
  Filled 2020-12-04: qty 1

## 2020-12-04 MED ORDER — FLUTICASONE PROPIONATE 50 MCG/ACT NA SUSP
2.0000 | Freq: Every day | NASAL | Status: DC
  Administered 2020-12-05: 09:00:00 2 via NASAL
  Filled 2020-12-04: qty 16

## 2020-12-04 MED ORDER — TRANEXAMIC ACID-NACL 1000-0.7 MG/100ML-% IV SOLN
INTRAVENOUS | Status: AC
  Filled 2020-12-04: qty 100

## 2020-12-04 MED ORDER — DIPHENHYDRAMINE HCL 12.5 MG/5ML PO ELIX
12.5000 mg | ORAL_SOLUTION | ORAL | Status: DC | PRN
Start: 2020-12-04 — End: 2020-12-06

## 2020-12-04 MED ORDER — SUCCINYLCHOLINE CHLORIDE 20 MG/ML IJ SOLN
INTRAMUSCULAR | Status: DC | PRN
  Administered 2020-12-04: 120 mg via INTRAVENOUS

## 2020-12-04 MED ORDER — PROMETHAZINE HCL 25 MG/ML IJ SOLN
INTRAMUSCULAR | Status: AC
  Filled 2020-12-04: qty 1

## 2020-12-04 MED ORDER — HYDROMORPHONE HCL 1 MG/ML IJ SOLN: 0.5000 mg | INTRAMUSCULAR | Status: DC | PRN

## 2020-12-04 MED ORDER — APIXABAN 2.5 MG PO TABS
2.5000 mg | ORAL_TABLET | Freq: Two times a day (BID) | ORAL | Status: DC
  Administered 2020-12-05 – 2020-12-06 (×3): 2.5 mg via ORAL
  Filled 2020-12-04 (×3): qty 1

## 2020-12-04 MED ORDER — MAGNESIUM HYDROXIDE 400 MG/5ML PO SUSP
30.0000 mL | Freq: Every day | ORAL | Status: DC
  Administered 2020-12-04 – 2020-12-06 (×3): 30 mL via ORAL
  Filled 2020-12-04 (×3): qty 30

## 2020-12-04 MED ORDER — FLEET ENEMA 7-19 GM/118ML RE ENEM: 1.0000 | ENEMA | Freq: Once | RECTAL | Status: DC | PRN

## 2020-12-04 MED ORDER — PANTOPRAZOLE SODIUM 40 MG PO TBEC
40.0000 mg | DELAYED_RELEASE_TABLET | Freq: Two times a day (BID) | ORAL | Status: DC
  Administered 2020-12-04 – 2020-12-06 (×5): 40 mg via ORAL
  Filled 2020-12-04 (×5): qty 1

## 2020-12-04 MED ORDER — PREGABALIN 50 MG PO CAPS
50.0000 mg | ORAL_CAPSULE | Freq: Two times a day (BID) | ORAL | Status: DC
  Administered 2020-12-04 – 2020-12-06 (×4): 50 mg via ORAL
  Filled 2020-12-04 (×4): qty 1

## 2020-12-04 MED ORDER — TRAMADOL HCL 50 MG PO TABS
50.0000 mg | ORAL_TABLET | ORAL | Status: DC | PRN
  Administered 2020-12-06: 50 mg via ORAL
  Filled 2020-12-04: qty 1

## 2020-12-04 MED ORDER — PROPOFOL 500 MG/50ML IV EMUL
INTRAVENOUS | Status: DC | PRN
  Administered 2020-12-04: 90 ug/kg/min via INTRAVENOUS
  Administered 2020-12-04: 125 ug/kg/min via INTRAVENOUS

## 2020-12-04 MED ORDER — CEFAZOLIN SODIUM-DEXTROSE 2-4 GM/100ML-% IV SOLN
INTRAVENOUS | Status: AC
  Administered 2020-12-04: 13:00:00 2 g via INTRAVENOUS
  Filled 2020-12-04: qty 100

## 2020-12-04 MED ORDER — ASCORBIC ACID 500 MG PO TABS
500.0000 mg | ORAL_TABLET | Freq: Every day | ORAL | Status: DC
  Administered 2020-12-04 – 2020-12-06 (×3): 500 mg via ORAL
  Filled 2020-12-04 (×3): qty 1

## 2020-12-04 MED ORDER — MENTHOL 3 MG MT LOZG
1.0000 | LOZENGE | OROMUCOSAL | Status: DC | PRN
  Filled 2020-12-04: qty 9

## 2020-12-04 MED ORDER — CHLORHEXIDINE GLUCONATE 0.12 % MT SOLN
OROMUCOSAL | Status: AC
  Administered 2020-12-04: 07:00:00 15 mL via OROMUCOSAL
  Filled 2020-12-04: qty 15

## 2020-12-04 MED ORDER — B COMPLEX VITAMINS PO CAPS: 1.0000 | ORAL_CAPSULE | Freq: Every day | ORAL | Status: DC

## 2020-12-04 SURGICAL SUPPLY — 63 items
ARTICULEZE HEAD (Hips) ×3 IMPLANT
BLADE DRUM FLTD (BLADE) ×3 IMPLANT
BLADE SAW 90X25X1.19 OSCILLAT (BLADE) ×3 IMPLANT
CANISTER SUCT 1200ML W/VALVE (MISCELLANEOUS) ×3 IMPLANT
CANISTER SUCT 3000ML PPV (MISCELLANEOUS) ×6 IMPLANT
CARTRIDGE OIL MAESTRO DRILL (MISCELLANEOUS) ×1 IMPLANT
COVER WAND RF STERILE (DRAPES) ×3 IMPLANT
CUP ACETBLR 54 OD 100 SERIES (Hips) ×2 IMPLANT
DIFFUSER DRILL AIR PNEUMATIC (MISCELLANEOUS) ×3 IMPLANT
DRAPE 3/4 80X56 (DRAPES) ×3 IMPLANT
DRAPE INCISE IOBAN 66X60 STRL (DRAPES) ×3 IMPLANT
DRSG DERMACEA 8X12 NADH (GAUZE/BANDAGES/DRESSINGS) ×3 IMPLANT
DRSG MEPILEX SACRM 8.7X9.8 (GAUZE/BANDAGES/DRESSINGS) ×3 IMPLANT
DRSG OPSITE POSTOP 4X12 (GAUZE/BANDAGES/DRESSINGS) ×3 IMPLANT
DRSG OPSITE POSTOP 4X14 (GAUZE/BANDAGES/DRESSINGS) IMPLANT
DRSG TEGADERM 4X4.75 (GAUZE/BANDAGES/DRESSINGS) ×3 IMPLANT
DURAPREP 26ML APPLICATOR (WOUND CARE) ×3 IMPLANT
ELECT REM PT RETURN 9FT ADLT (ELECTROSURGICAL) ×3
ELECTRODE REM PT RTRN 9FT ADLT (ELECTROSURGICAL) ×1 IMPLANT
GLOVE BIO SURGEON STRL SZ7.5 (GLOVE) ×6 IMPLANT
GLOVE BIOGEL M STRL SZ7.5 (GLOVE) ×6 IMPLANT
GLOVE BIOGEL PI IND STRL 7.5 (GLOVE) ×1 IMPLANT
GLOVE BIOGEL PI INDICATOR 7.5 (GLOVE) ×2
GLOVE INDICATOR 8.0 STRL GRN (GLOVE) ×3 IMPLANT
GOWN STRL REUS W/ TWL LRG LVL3 (GOWN DISPOSABLE) ×2 IMPLANT
GOWN STRL REUS W/ TWL XL LVL3 (GOWN DISPOSABLE) ×1 IMPLANT
GOWN STRL REUS W/TWL LRG LVL3 (GOWN DISPOSABLE) ×4
GOWN STRL REUS W/TWL XL LVL3 (GOWN DISPOSABLE) ×2
HEAD ARTICULEZE (Hips) IMPLANT
HEMOVAC 400CC 10FR (MISCELLANEOUS) ×3 IMPLANT
HOLDER FOLEY CATH W/STRAP (MISCELLANEOUS) ×3 IMPLANT
HOOD PEEL AWAY FLYTE STAYCOOL (MISCELLANEOUS) ×6 IMPLANT
IRRIGATION SURGIPHOR STRL (IV SOLUTION) ×3 IMPLANT
KIT PEG BOARD PINK (KITS) ×3 IMPLANT
KIT TURNOVER KIT A (KITS) ×3 IMPLANT
LINER MARATHON 10D 36X54 (Hips) IMPLANT
LINER MARATHON 10DEG 36X54 (Hips) ×2 IMPLANT
MANIFOLD NEPTUNE II (INSTRUMENTS) ×6 IMPLANT
NDL SAFETY ECLIPSE 18X1.5 (NEEDLE) ×1 IMPLANT
NEEDLE HYPO 18GX1.5 SHARP (NEEDLE) ×2
NS IRRIG 500ML POUR BTL (IV SOLUTION) ×3 IMPLANT
OIL CARTRIDGE MAESTRO DRILL (MISCELLANEOUS) ×3
PACK HIP PROSTHESIS (MISCELLANEOUS) ×3 IMPLANT
PENCIL SMOKE ULTRAEVAC 22 CON (MISCELLANEOUS) ×3 IMPLANT
PULSAVAC PLUS IRRIG FAN TIP (DISPOSABLE) ×3
SOL .9 NS 3000ML IRR  AL (IV SOLUTION) ×2
SOL .9 NS 3000ML IRR UROMATIC (IV SOLUTION) ×1 IMPLANT
SOL PREP PVP 2OZ (MISCELLANEOUS) ×3
SOLUTION PREP PVP 2OZ (MISCELLANEOUS) ×1 IMPLANT
SPONGE DRAIN TRACH 4X4 STRL 2S (GAUZE/BANDAGES/DRESSINGS) ×3 IMPLANT
STAPLER SKIN PROX 35W (STAPLE) ×3 IMPLANT
STEM FEM CMNTLSS SM AML 13.5 (Hips) ×2 IMPLANT
SUT ETHIBOND #5 BRAIDED 30INL (SUTURE) ×3 IMPLANT
SUT VIC AB 0 CT1 36 (SUTURE) ×3 IMPLANT
SUT VIC AB 1 CT1 36 (SUTURE) ×6 IMPLANT
SUT VIC AB 2-0 CT1 27 (SUTURE) ×2
SUT VIC AB 2-0 CT1 TAPERPNT 27 (SUTURE) ×1 IMPLANT
SYR 20ML LL LF (SYRINGE) ×3 IMPLANT
TAPE CLOTH 3X10 WHT NS LF (GAUZE/BANDAGES/DRESSINGS) ×3 IMPLANT
TAPE TRANSPORE STRL 2 31045 (GAUZE/BANDAGES/DRESSINGS) ×3 IMPLANT
TIP FAN IRRIG PULSAVAC PLUS (DISPOSABLE) ×1 IMPLANT
TOWEL OR 17X26 4PK STRL BLUE (TOWEL DISPOSABLE) ×3 IMPLANT
TRAY FOLEY MTR SLVR 16FR STAT (SET/KITS/TRAYS/PACK) ×3 IMPLANT
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
## 2020-12-04 NOTE — Anesthesia Procedure Notes (Signed)
Spinal  Patient location during procedure: OR Start time: 12/04/2020 7:20 AM End time: 12/04/2020 7:24 AM Staffing Performed: resident/CRNA  Resident/CRNA: Nelda Marseille, CRNA Preanesthetic Checklist Completed: patient identified, IV checked, site marked, risks and benefits discussed, surgical consent, monitors and equipment checked, pre-op evaluation and timeout performed Spinal Block Patient position: sitting Prep: Betadine Patient monitoring: heart rate, continuous pulse ox, blood pressure and cardiac monitor Approach: midline Location: L3-4 Injection technique: single-shot Needle Needle type: Whitacre and Introducer  Needle gauge: 25 G Needle length: 9 cm Additional Notes Negative paresthesia. Negative blood return. Positive free-flowing CSF. Expiration date of kit checked and confirmed. Patient tolerated procedure well, without complications.

## 2020-12-04 NOTE — Anesthesia Procedure Notes (Signed)
Procedure Name: Intubation Date/Time: 12/04/2020 8:20 AM Performed by: Nelda Marseille, CRNA Pre-anesthesia Checklist: Patient identified, Patient being monitored, Timeout performed, Emergency Drugs available and Suction available Patient Re-evaluated:Patient Re-evaluated prior to induction Oxygen Delivery Method: Circle system utilized Preoxygenation: Pre-oxygenation with 100% oxygen Induction Type: IV induction Ventilation: Mask ventilation without difficulty Laryngoscope Size: Mac, 3 and McGraph Grade View: Grade II Tube type: Oral Tube size: 7.5 mm Number of attempts: 1 Airway Equipment and Method: Stylet Placement Confirmation: ETT inserted through vocal cords under direct vision,  positive ETCO2 and breath sounds checked- equal and bilateral Secured at: 23 cm Tube secured with: Tape Dental Injury: Teeth and Oropharynx as per pre-operative assessment  Comments: Spinal failure.  Intubation with ETT.  VSS no apparent distress.

## 2020-12-04 NOTE — Op Note (Signed)
OPERATIVE NOTE  DATE OF SURGERY:  12/04/2020  PATIENT NAME:  [name].   DOB: Oct 08, 1951  MRN: [id number]  PRE-OPERATIVE DIAGNOSIS: Degenerative arthrosis of the left hip, primary  POST-OPERATIVE DIAGNOSIS:  Same  PROCEDURE:  Left total hip arthroplasty  SURGEON:  Marciano Sequin. M.D.  ASSISTANT: Cassell Smiles, PA-C (present and scrubbed throughout the case, critical for assistance with exposure, retraction, instrumentation, and closure)  ANESTHESIA: spinal and general  ESTIMATED BLOOD LOSS: 100 mL  FLUIDS REPLACED: 1400 mL of crystalloid  DRAINS: 2 medium Hemovac  IMPLANTS UTILIZED: DePuy 13.5 mm small stature AML femoral stem, 54 mm OD Pinnacle 100 acetabular component, +4 mm 10 degree Pinnacle Marathon polyethylene insert, and a 36 mm M-SPEC +5 mm hip ball  INDICATIONS FOR SURGERY: [name]. is a 69 y.o. year old male with a long history of progressive hip and groin  pain. X-rays demonstrated severe degenerative changes. The patient had not seen any significant improvement despite conservative nonsurgical intervention. After discussion of the risks and benefits of surgical intervention, the patient expressed understanding of the risks benefits and agree with plans for total hip arthroplasty.   The risks, benefits, and alternatives were discussed at length including but not limited to the risks of infection, bleeding, nerve injury, stiffness, blood clots, the need for revision surgery, limb length inequality, dislocation, cardiopulmonary complications, among others, and they were willing to proceed.  PROCEDURE IN DETAIL: The patient was brought into the operating room and, after adequate spinal and general anesthesia was achieved, the patient was placed in a right lateral decubitus position. Axillary roll was placed and all bony prominences were well-padded. The patient's left hip was cleaned and prepped with alcohol and DuraPrep and draped in the usual  sterile fashion. A "timeout" was performed as per usual protocol. A lateral curvilinear incision was made gently curving towards the posterior superior iliac spine. The IT band was incised in line with the skin incision and the fibers of the gluteus maximus were split in line. The piriformis tendon was identified, skeletonized, and incised at its insertion to the proximal femur and reflected posteriorly. A T type posterior capsulotomy was performed. Prior to dislocation of the femoral head, a threaded Steinmann pin was inserted through a separate stab incision into the pelvis superior to the acetabulum and bent in the form of a stylus so as to assess limb length and hip offset throughout the procedure. The femoral head was then dislocated posteriorly. Inspection of the femoral head demonstrated severe degenerative changes with full-thickness loss of articular cartilage. The femoral neck cut was performed using an oscillating saw. The anterior capsule was elevated off of the femoral neck using a periosteal elevator. Attention was then directed to the acetabulum. The remnant of the labrum was excised using electrocautery. Inspection of the acetabulum also demonstrated significant degenerative changes. The acetabulum was reamed in sequential fashion up to a 53 mm diameter. Good punctate bleeding bone was encountered. A 54 mm Pinnacle 100 acetabular component was positioned and impacted into place. Good scratch fit was appreciated. A neutral polyethylene trial was inserted.  Attention was then directed to the proximal femur. A hole for reaming of the proximal femoral canal was created using a high-speed burr. The femoral canal was reamed in sequential fashion up to a 13 mm diameter. This allowed for approximately 7 cm of scratch fit.  It was thus elected to ream up to a 13.5 mm diameter to allow for a line to  line fit.  Serial broaches were inserted up to a 13.5 mm small stature femoral broach. Calcar region was  planed and a trial reduction was performed using a 36 mm hip ball with a +5 mm neck length.  Reasonably good stability was noted but it was elected to trial with a +4 mm 10 degree trial liner with the high side at the 4 o'clock position.  Good equalization of limb lengths and hip offset was appreciated and excellent stability was noted both anteriorly and posteriorly. Trial components were removed. The acetabular shell was irrigated with copious amounts of normal saline with antibiotic solution and suctioned dry. A +4 mm 10 degree Pinnacle Marathon polyethylene insert was positioned with the high side at the 4 o'clock position and impacted into place. Next, a 13.5 mm small stature AML femoral stem was positioned and impacted into place. Excellent scratch fit was appreciated. A trial reduction was again performed with a 36 mm hip ball with a +5 mm neck length. Again, good equalization of limb lengths was appreciated and excellent stability appreciated both anteriorly and posteriorly. The hip was then dislocated and the trial hip ball was removed. The Morse taper was cleaned and dried. A 36 mm M-SPEC hip ball with a +5 mm neck length was placed on the trunnion and impacted into place. The hip was then reduced and placed through range of motion. Excellent stability was appreciated both anteriorly and posteriorly.  The wound was irrigated with copious amounts of normal saline followed by 500 ml of Surgiphor and suctioned dry. Good hemostasis was appreciated. The posterior capsulotomy was repaired using #5 Ethibond. Piriformis tendon was reapproximated to the undersurface of the gluteus medius tendon using #5 Ethibond. The IT band was reapproximated using interrupted sutures of #1 Vicryl. Subcutaneous tissue was approximated using first #0 Vicryl followed by #2-0 Vicryl. The skin was closed with skin staples.  The patient tolerated the procedure well and was transported to the recovery room in stable condition.    Marciano Sequin., M.D.

## 2020-12-04 NOTE — Transfer of Care (Signed)
Immediate Anesthesia Transfer of Care Note  Patient: [name].  Procedure(s) Performed: TOTAL HIP ARTHROPLASTY (Left Hip)  Patient Location: PACU  Anesthesia Type:General  Level of Consciousness: awake, alert  and oriented  Airway & Oxygen Therapy: Patient Spontanous Breathing and Patient connected to face mask oxygen  Post-op Assessment: Report given to RN and Post -op Vital signs reviewed and stable  Post vital signs: Reviewed and stable  Last Vitals:  Vitals Value Taken Time  BP 132/84 12/04/20 1059  Temp 36.2 C 12/04/20 1059  Pulse 80 12/04/20 1109  Resp 17 12/04/20 1109  SpO2 100 % 12/04/20 1109  Vitals shown include unvalidated device data.  Last Pain:  Vitals:   12/04/20 1059  TempSrc:   PainSc: 0-No pain         Complications: No complications documented.

## 2020-12-04 NOTE — Plan of Care (Signed)

## 2020-12-04 NOTE — H&P (Signed)
The patient has been re-examined, and the chart reviewed, and there have been no interval changes to the documented history and physical.    The risks, benefits, and alternatives have been discussed at length. The patient expressed understanding of the risks benefits and agreed with plans for surgical intervention.  Cordai P. Karista Aispuro, Jr. M.D.    

## 2020-12-04 NOTE — Anesthesia Preprocedure Evaluation (Signed)
Anesthesia Evaluation  Patient identified by MRN, date of birth, ID band  Reviewed: Allergy & Precautions, H&P , NPO status , Patient's Chart, lab work & pertinent test results  Airway Mallampati: II  TM Distance: >3 FB Neck ROM: full    Dental  (+) Partial Upper   Pulmonary neg pulmonary ROS,    Pulmonary exam normal        Cardiovascular hypertension, Normal cardiovascular exam+ dysrhythmias Atrial Fibrillation + Valvular Problems/Murmurs  Rhythm:regular Rate:Normal  H/o Afib- ablation in 2017 and no issues since.  Says that he does have a "skipped beat" on occaison.     Neuro/Psych PSYCHIATRIC DISORDERS Depression negative neurological ROS     GI/Hepatic Neg liver ROS, GERD  ,  Endo/Other  diabetes, Well Controlled, Type 2Hyperthyroidism   Renal/GU negative Renal ROS  negative genitourinary   Musculoskeletal  (+) Arthritis ,   Abdominal   Peds negative pediatric ROS (+)  Hematology negative hematology ROS (+)   Anesthesia Other Findings Past Medical History: No date: Atrial fibrillation ([place])     Comment:  resolved with 2 abblations in 2017 No date: Barrett's esophagus No date: Cancer [facility])     Comment:  nonmelanoma skin cancer  No date: Chronic back pain No date: Depression No date: Diabetes mellitus without complication (HCC) No date: Diverticulitis No date: Diverticulosis No date: Dysrhythmia     Comment:  controlled by Diltiazem No date: GERD (gastroesophageal reflux disease) No date: Heart murmur     Comment:  normal Echo No date: History of kidney stones No date: Hyperlipidemia No date: Hypertension No date: Hyperthyroidism No date: Hypoglycemia No date: IBS (irritable bowel syndrome) No date: Irritable bowel syndrome No date: Morbid obesity ([place]) No date: Pneumonia No date: Pulmonary embolus [facility])     Comment:  following [place] admission July 2013 No date: Sinusitis No date: Thyroid  disease No date: Thyroid nodule  Reproductive/Obstetrics                             Anesthesia Physical  Anesthesia Plan  ASA: III  Anesthesia Plan: Spinal   Post-op Pain Management:    Induction: Intravenous  PONV Risk Score and Plan:   Airway Management Planned: Nasal Cannula  Additional Equipment:   Intra-op Plan:   Post-operative Plan:   Informed Consent: I have reviewed the patients History and Physical, chart, labs and discussed the procedure including the risks, benefits and alternatives for the proposed anesthesia with the patient or authorized representative who has indicated his/her understanding and acceptance.     Dental advisory given  Plan Discussed with: CRNA and Surgeon  Anesthesia Plan Comments:         Anesthesia Quick Evaluation

## 2020-12-04 NOTE — Evaluation (Signed)
Physical Therapy Evaluation Patient Details Name: [name]. MRN: [id number] DOB: 10/02/1951 Today's Date: 12/04/2020   History of Present Illness  Pt admitted for L THR. History of IVC filter and Afib.  Clinical Impression  Pt is a pleasant 69 year old male who was admitted for L THR. Pt performs bed mobility/transfers with min assist and ambulation with cga and RW. Educated on hip precautions. Pt demonstrates deficits with strength/mobility/pain. Appears very motivated to participate. Would benefit from skilled PT to address above deficits and promote optimal return to PLOF. Recommend transition to [place] upon discharge from acute hospitalization.     Follow Up Recommendations Home health PT    Equipment Recommendations  None recommended by PT    Recommendations for Other Services       Precautions / Restrictions Precautions Precautions: Posterior Hip;Fall Precaution Booklet Issued: No Restrictions Weight Bearing Restrictions: Yes LLE Weight Bearing: Weight bearing as tolerated      Mobility  Bed Mobility Overal bed mobility: Needs Assistance Bed Mobility: Supine to Sit     Supine to sit: Min assist     General bed mobility comments: cues and set up provided. Assist for B LE management    Transfers Overall transfer level: Needs assistance Equipment used: Rolling walker (2 wheeled) Transfers: Sit to/from Stand Sit to Stand: Min assist         General transfer comment: cues for hip precautions. Once standing, good weight acceptance noted  Ambulation/Gait Ambulation/Gait assistance: Min guard Gait Distance (Feet): 8 Feet Assistive device: Rolling walker (2 wheeled) Gait Pattern/deviations: Step-to pattern     General Gait Details: ambulated over to [facility] and then to recliner. Needs cues for L LE ER as he tends to favor IR. Antaglic gait pattern  Stairs            Wheelchair Mobility    Modified Rankin (Stroke Patients Only)        Balance Overall balance assessment: Needs assistance Sitting-balance support: Feet supported;Bilateral upper extremity supported Sitting balance-Leahy Scale: Good     Standing balance support: Bilateral upper extremity supported Standing balance-Leahy Scale: Good                               Pertinent Vitals/Pain Pain Assessment: Faces Faces Pain Scale: Hurts a little bit Pain Location: L hip Pain Descriptors / Indicators: Operative site guarding Pain Intervention(s): Limited activity within patient's tolerance;Repositioned;Premedicated before session    Home Living Family/patient expects to be discharged to:: Private residence Living Arrangements: Spouse/significant other Available Help at Discharge: Family Type of Home: House Home Access: Stairs to enter Entrance Stairs-Rails: None Entrance Stairs-Number of Steps: 2 Home Layout: One level Home Equipment: Environmental consultant - 2 wheels;Walker - 4 wheels;Cane - single point Additional Comments: reports he has elevated toliet    Prior Function Level of Independence: Independent with assistive device(s)         Comments: was primarily using [place] for mobility     Hand Dominance        Extremity/Trunk Assessment   Upper Extremity Assessment Upper Extremity Assessment: Overall WFL for tasks assessed    Lower Extremity Assessment Lower Extremity Assessment: Generalized weakness (L LE grossly 3/5; R LE grossly 4/5)       Communication   Communication: No difficulties  Cognition Arousal/Alertness: Awake/alert Behavior During Therapy: WFL for tasks assessed/performed Overall Cognitive Status: Within Functional Limits for tasks assessed  General Comments      Exercises Other Exercises Other Exercises: ambulated over to [facility]. Pt able to urinate, no BM. Min assist for transfer on/off toliet   Assessment/Plan    PT Assessment Patient needs continued PT  services  PT Problem List Decreased strength;Decreased mobility;Decreased knowledge of use of DME;Pain;Decreased balance       PT Treatment Interventions DME instruction;Gait training;Therapeutic exercise;Stair training    PT Goals (Current goals can be found in the Care Plan section)  Acute Rehab PT Goals Patient Stated Goal: to go home PT Goal Formulation: With patient Time For Goal Achievement: 12/18/20 Potential to Achieve Goals: Good    Frequency BID   Barriers to discharge        Co-evaluation               AM-PAC PT "6 Clicks" Mobility  Outcome Measure Help needed turning from your back to your side while in a flat bed without using bedrails?: A Little Help needed moving from lying on your back to sitting on the side of a flat bed without using bedrails?: A Little Help needed moving to and from a bed to a chair (including a wheelchair)?: A Little Help needed standing up from a chair using your arms (e.g., wheelchair or bedside chair)?: A Little Help needed to walk in hospital room?: A Little Help needed climbing 3-5 steps with a railing? : A Lot 6 Click Score: 17    End of Session Equipment Utilized During Treatment: Gait belt Activity Tolerance: Patient tolerated treatment well Patient left: in chair;with chair alarm set;with family/visitor present Nurse Communication: Mobility status PT Visit Diagnosis: Muscle weakness (generalized) (M62.81);Difficulty in walking, not elsewhere classified (R26.2);Pain Pain - Right/Left: Left Pain - part of body: Hip    Time: 6503-5465 PT Time Calculation (min) (ACUTE ONLY): 29 min   Charges:   PT Evaluation $PT Eval Low Complexity: 1 Low PT Treatments $Therapeutic Exercise: 8-22 mins        Greggory Stallion, PT, DPT [phone]   Romari Gasparro 12/04/2020, 5:08 PM

## 2020-12-05 LAB — CBC
HCT: 34.4 % — ABNORMAL LOW (ref 39.0–52.0)
Hemoglobin: 11.9 g/dL — ABNORMAL LOW (ref 13.0–17.0)
MCH: 31.9 pg (ref 26.0–34.0)
MCHC: 34.6 g/dL (ref 30.0–36.0)
MCV: 92.2 fL (ref 80.0–100.0)
Platelets: 331 10*3/uL (ref 150–400)
RBC: 3.73 MIL/uL — ABNORMAL LOW (ref 4.22–5.81)
RDW: 14.6 % (ref 11.5–15.5)
WBC: 11.5 10*3/uL — ABNORMAL HIGH (ref 4.0–10.5)
nRBC: 0 % (ref 0.0–0.2)

## 2020-12-05 LAB — GLUCOSE, CAPILLARY
Glucose-Capillary: 127 mg/dL — ABNORMAL HIGH (ref 70–99)
Glucose-Capillary: 134 mg/dL — ABNORMAL HIGH (ref 70–99)
Glucose-Capillary: 111 mg/dL — ABNORMAL HIGH (ref 70–99)
Glucose-Capillary: 112 mg/dL — ABNORMAL HIGH (ref 70–99)

## 2020-12-05 LAB — CREATININE, SERUM
Creatinine, Ser: 0.79 mg/dL (ref 0.61–1.24)
GFR, Estimated: >60 mL/min (ref >60)

## 2020-12-05 NOTE — Discharge Summary (Signed)
Physician Discharge Summary  Patient ID: [name]. MRN: [id number] DOB/AGE: [birth year] 69 y.o.  Admit date: 12/04/2020 Discharge date: 12/06/2020  Admission Diagnoses:  Hx of total hip arthroplasty, left [Z96.642]  Surgeries:Procedure(s):  Left total hip arthroplasty  SURGEON:  Marciano Sequin. M.D.  ASSISTANT: Cassell Smiles, PA-C (present and scrubbed throughout the case, critical for assistance with exposure, retraction, instrumentation, and closure)  ANESTHESIA: spinal and general  ESTIMATED BLOOD LOSS: 100 mL  FLUIDS REPLACED: 1400 mL of crystalloid  DRAINS: 2 medium Hemovac  IMPLANTS UTILIZED: DePuy 13.5 mm small stature AML femoral stem, 54 mm OD Pinnacle 100 acetabular component, +4 mm 10 degree Pinnacle Marathon polyethylene insert, and a 36 mm M-SPEC +5 mm hip ball  Discharge Diagnoses: Patient Active Problem List   Diagnosis Date Noted  . Hx of total hip arthroplasty, left 12/04/2020  . Chronic pulmonary embolism ([place]) 09/15/2020  . GERD (gastroesophageal reflux disease) 09/14/2020  . Hyperlipidemia 09/14/2020  . Primary osteoarthritis of left hip 08/24/2020  . Insomnia 01/07/2019  . Depression, prolonged 01/07/2019  . Asplenia 08/11/2017  . Heart palpitations 04/01/2017  . PVC's (premature ventricular contractions) 02/14/2017  . Atrial fibrillation with rapid ventricular response ([place]) 06/26/2016  . Chronic insomnia 05/07/2016  . Status post catheter ablation of atrial fibrillation 03/21/2016  . Chest pain with high risk for cardiac etiology 09/08/2015  . Hypokalemia 12/30/2014  . Essential hypertension 12/21/2014  . Paroxysmal atrial fibrillation ([place]) 12/21/2014  . History of cardiac cath 10/13/2014  . Glaucoma 08/24/2014  . Type 2 diabetes mellitus without complications ([place]) 71/05/2693  . Barrett's esophagus determined by biopsy 07/19/2014  . 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 Thyroid nodule 07/12/2014  . Weight  gain, abnormal 07/12/2014  . Incisional hernia without mention of obstruction or gangrene 04/20/2014  . Incisional hernia without obstruction or gangrene 03/22/2014    Past Medical History:  Diagnosis Date  . Arthritis   . Atrial fibrillation ([place])    resolved with 2 abblations in 2017  . Barrett's esophagus   . Cancer ([place])    nonmelanoma skin cancer   . Chronic back pain   . Depression   . Diabetes mellitus without complication ([place])   . Diverticulitis   . Diverticulosis   . Dysrhythmia    controlled by Diltiazem  . GERD (gastroesophageal reflux disease)   . Heart murmur    normal Echo  . History of kidney stones   . Hyperlipidemia   . Hypertension   . Hyperthyroidism   . Hypoglycemia   . IBS (irritable bowel syndrome)   . Irritable bowel syndrome   . Morbid obesity ([place])   . Pneumonia   . Pulmonary embolus [facility])    following [facility] admission July 2013  . Sinusitis   . Thyroid disease   . Thyroid nodule      Transfusion:    Consultants (if any):   Discharged Condition: Improved  Hospital Course: Artem Bunte. is an 69 y.o. male who was admitted 12/04/2020 with a diagnosis of left hip osteoarthritis and went to the operating room on 12/04/2020 and underwent left total hip arthroplasty through posterior approach. The patient received perioperative antibiotics for prophylaxis (see below). The patient tolerated the procedure well and was transported to PACU in stable condition. After meeting PACU criteria, the patient was subsequently transferred to the Orthopaedics/Rehabilitation unit.   The patient received DVT prophylaxis in the form of early mobilization, Foot Pumps, TED hose and Eliquis. A sacral pad had been  placed and heels were elevated off of the bed with rolled towels in order to protect skin integrity. Foley catheter was discontinued on postoperative day #0. Wound drains were discontinued on postoperative day #2. The surgical incision was healing well  without signs of infection.  Physical therapy was initiated postoperatively for transfers, gait training, and strengthening. Occupational therapy was initiated for activities of daily living and evaluation for assisted devices. Rehabilitation goals were reviewed in detail with the patient. The patient made steady progress with physical therapy and physical therapy recommended discharge to Home.   The patient achieved the preliminary goals of this hospitalization and was felt to be medically and orthopaedically appropriate for discharge.  He was given perioperative antibiotics:  Anti-infectives (From admission, onward)   Start     Dose/Rate Route Frequency Ordered Stop   12/04/20 1400  ceFAZolin (ANCEF) IVPB 2g/100 mL premix        2 g 200 mL/hr over 30 Minutes Intravenous Every 6 hours 12/04/20 1218 12/04/20 2025   12/04/20 0614  ceFAZolin (ANCEF) 2-4 GM/100ML-% IVPB       Note to Pharmacy: [place] [place]   : cabinet override      12/04/20 0614 12/04/20 1355   12/04/20 0600  ceFAZolin (ANCEF) IVPB 2g/100 mL premix        2 g 200 mL/hr over 30 Minutes Intravenous On call to O.R. 12/03/20 2315 12/04/20 0750    .  Recent vital signs:  Vitals:   12/05/20 2323 12/06/20 0343  BP: 106/72 (!) 151/96  Pulse: 72 87  Resp: 16 20  Temp: 98.2 F (36.8 C) 97.9 F (36.6 C)  SpO2: 97% 96%    Recent laboratory studies:  Recent Labs    12/05/20 1017  WBC 11.5*  HGB 11.9*  HCT 34.4*  PLT 331  CREATININE 0.79    Diagnostic Studies: PERIPHERAL VASCULAR CATHETERIZATION  Result Date: 11/28/2020 See Op Note  DG Hip Port Unilat With Pelvis 1V Left  Result Date: 12/04/2020 CLINICAL DATA:  Left total hip arthroplasty. EXAM: DG HIP (WITH OR WITHOUT PELVIS) 1V PORT LEFT COMPARISON:  CT abdomen pelvis dated February 28, 2014. FINDINGS: The left hip demonstrates a total arthroplasty without evidence of hardware failure or complication. There is no fracture or dislocation. The alignment is  anatomic. Post-surgical changes and subcutaneous emphysema noted in the surrounding soft tissues. A surgical drain is present. IMPRESSION: 1. Left total hip arthroplasty without acute postoperative complication. Electronically Signed   By: Titus Dubin M.D.   On: 12/04/2020 11:31    Discharge Medications:   Allergies as of 12/06/2020      Reactions   Dolobid [diflunisal] Other (See Comments)   Muscle spasms Difficulty breathing   Meloxicam Other (See Comments), Shortness Of Breath   sleepiness   Amiodarone Other (See Comments)   hyperthyroidism   Codeine Nausea Only, Other (See Comments)   headache   Sulfa Antibiotics Itching, Rash      Medication List    STOP taking these medications   aspirin EC 81 MG tablet   ibuprofen 800 MG tablet Commonly known as: ADVIL     TAKE these medications   apixaban 2.5 MG Tabs tablet Commonly known as: ELIQUIS Take 1 tablet (2.5 mg total) by mouth every 12 (twelve) hours.   ascorbic acid 500 MG tablet Commonly known as: VITAMIN C Take 500 mg by mouth daily.   b complex vitamins capsule Take 1 capsule by mouth daily.   celecoxib 200 MG capsule Commonly  known as: CELEBREX Take 1 capsule (200 mg total) by mouth 2 (two) times daily.   Coenzyme Q10 200 MG capsule Take 200 mg by mouth daily.   diltiazem 120 MG 24 hr capsule Commonly known as: CARDIZEM CD Take 1 capsule (120 mg total) by mouth daily.   diltiazem 30 MG tablet Commonly known as: CARDIZEM Take 30 mg by mouth 2 (two) times daily.   fluticasone 50 MCG/ACT nasal spray Commonly known as: FLONASE Place 2 sprays into both nostrils daily.   ketotifen 0.025 % ophthalmic solution Commonly known as: ZADITOR Place 1 drop into both eyes daily as needed (itchy eyes).   levocetirizine 5 MG tablet Commonly known as: XYZAL Take 5 mg by mouth at bedtime.   LIVERITE PO Take 2 tablets by mouth daily.   losartan 25 MG tablet Commonly known as: COZAAR Take 25 mg by mouth  daily.   magnesium oxide 400 MG tablet Commonly known as: MAG-OX Take 1 tablet by mouth 2 (two) times daily.   metFORMIN 500 MG 24 hr tablet Commonly known as: GLUCOPHAGE-XR Take 500 mg by mouth 2 (two) times daily as needed (Depending on blood glucose and entake).   metFORMIN 500 MG tablet Commonly known as: GLUCOPHAGE Take 500 mg by mouth 2 (two) times daily as needed (Depending on blood glucose and entake).   multivitamin tablet Take 1 tablet by mouth daily.   OVER THE COUNTER MEDICATION Take 2 tablets by mouth daily. Naturesplus ultra hair plus   OVER THE COUNTER MEDICATION Take 1 tablet by mouth daily. Disc-gard+   OVER THE COUNTER MEDICATION Take 5 capsules by mouth in the morning, at noon, in the evening, and at bedtime. Fiber capsules   OVER THE COUNTER MEDICATION Take 1 capsule by mouth daily. Brain Health 5 Function Formula   oxyCODONE 5 MG immediate release tablet Commonly known as: Oxy IR/ROXICODONE Take 1 tablet (5 mg total) by mouth every 4 (four) hours as needed for moderate pain (pain score 4-6).   pantoprazole 40 MG tablet Commonly known as: PROTONIX Take 40 mg by mouth daily.   Potassium 99 MG Tabs Take 198 mg by mouth daily.   pregabalin 50 MG capsule Commonly known as: LYRICA Take 50 mg by mouth in the morning and at bedtime.   Psyllium 400 MG Caps Take 5 capsules by mouth daily.   saw palmetto 500 MG capsule Take 500 mg by mouth daily.   tiZANidine 2 MG tablet Commonly known as: ZANAFLEX Take 2 mg by mouth at bedtime.   traMADol 50 MG tablet Commonly known as: ULTRAM Take 1 tablet (50 mg total) by mouth every 4 (four) hours as needed for moderate pain.   Valerian 450 MG Caps Take 900 mg by mouth at bedtime as needed (Sleep).   vitamin B-12 500 MCG tablet Commonly known as: CYANOCOBALAMIN Take 500 mcg by mouth daily.   Vitamin D 50 MCG (2000 UT) Caps Take 4,000 Units by mouth daily.            Durable Medical Equipment   (From admission, onward)         Start     Ordered   12/04/20 1218  DME Walker rolling  Once       Question:  Patient needs a walker to treat with the following condition  Answer:  S/P total hip arthroplasty   12/04/20 1218   12/04/20 1218  DME Bedside commode  Once       Question:  Patient needs  a bedside commode to treat with the following condition  Answer:  S/P total hip arthroplasty   12/04/20 1218          Disposition: home with home health PT  There are no questions and answers to display.           Follow-up Information    Hooten, Laurice Record, MD On 01/16/2021.   Specialty: Orthopedic Surgery Why: at 2:00pm Contact information: [address] [address] [place], PA-C 12/06/2020, 7:16 AM

## 2020-12-05 NOTE — Progress Notes (Signed)
  Subjective: 1 Day Post-Op Procedure(s) (LRB): TOTAL HIP ARTHROPLASTY (Left) Patient reports pain as moderate and well-controlled.   Patient is well, and has had no acute complaints or problems Plan is to go Home after hospital stay. Negative for chest pain and shortness of breath Fever: no Gastrointestinal: negative for nausea and vomiting.   Patient has not had a bowel movement.  Objective: Vital signs in last 24 hours: Temp:  [97.2 F (36.2 C)-98.3 F (36.8 C)] 98.2 F (36.8 C) (12/21 0733) Pulse Rate:  [72-89] 81 (12/21 0733) Resp:  [15-22] 15 (12/21 0733) BP: (101-155)/(65-95) 128/80 (12/21 0733) SpO2:  [95 %-100 %] 95 % (12/21 0733)  Intake/Output from previous day:  Intake/Output Summary (Last 24 hours) at 12/05/2020 0814 Last data filed at 12/05/2020 0810 Gross per 24 hour  Intake 2756.36 ml  Output 3975 ml  Net -1218.64 ml    Intake/Output this shift: Total I/O In: -  Out: 750 [Urine:750]  Labs: No results for input(s): HGB in the last 72 hours. No results for input(s): WBC, RBC, HCT, PLT in the last 72 hours. No results for input(s): NA, K, CL, CO2, BUN, CREATININE, GLUCOSE, CALCIUM in the last 72 hours. No results for input(s): LABPT, INR in the last 72 hours.   EXAM General - Patient is Alert, Appropriate and Oriented Extremity - Neurovascular intact Dorsiflexion/Plantar flexion intact Compartment soft Dressing/Incision -clean, dry, no drainage, Postoperative dressing remains in place. Motor Function - intact, moving foot and toes well on exam.  Cardiovascular- Regular rate and rhythm, no murmurs/rubs/gallops Respiratory- Lungs clear to auscultation bilaterally Gastrointestinal- soft, nontender and hypoactive bowel sounds   Assessment/Plan: 1 Day Post-Op Procedure(s) (LRB): TOTAL HIP ARTHROPLASTY (Left) Active Problems:   Hx of total hip arthroplasty, left  Estimated body mass index is 30.57 kg/m as calculated from the following:   Height as  of this encounter: 5\' 9"  (1.753 m).   Weight as of this encounter: 93.9 kg. Advance diet Up with therapy Plan for discharge tomorrow    DVT Prophylaxis - Eloquis, Ted hose and foot pumps Weight-Bearing as tolerated to left leg  Cassell Smiles, PA-C [facility] Orthopaedic Surgery 12/05/2020, 8:14 AM

## 2020-12-05 NOTE — Progress Notes (Signed)
Physical Therapy Treatment Patient Details Name: [name]. MRN: [id number] DOB: 06-Nov-1951 Today's Date: 12/05/2020    History of Present Illness Pt admitted for L THR. History of IVC filter and Afib.    PT Comments    Pt has met goals for discharge today. Stair training performed with safe technique. REviewed HEP and answered all questions. Reports he has been ambulatory in room safely. Will continue to progress as tolerated. Reached out to MD about possible home dc this date.   Follow Up Recommendations  Home health PT     Equipment Recommendations  None recommended by PT    Recommendations for Other Services       Precautions / Restrictions Precautions Precautions: Posterior Hip;Fall Precaution Booklet Issued: Yes (comment) Restrictions Weight Bearing Restrictions: Yes LLE Weight Bearing: Weight bearing as tolerated    Mobility  Bed Mobility               General bed mobility comments: not performed as received in recliner  Transfers Overall transfer level: Needs assistance Equipment used: Rolling walker (2 wheeled) Transfers: Sit to/from Stand Sit to Stand: Supervision         General transfer comment: Safe technique. UPright posture  Ambulation/Gait Ambulation/Gait assistance: Min guard Gait Distance (Feet): 300 Feet Assistive device: Rolling walker (2 wheeled) Gait Pattern/deviations: Step-through pattern Gait velocity: 10' in 10"   General Gait Details: ambulated around RN station with reciprocal gait pattern. Upright posture   Stairs Stairs: Yes Stairs assistance: Min guard Stair Management: One rail Left Number of Stairs: 4 General stair comments: performed stair training once for each home enterance. Stair training demonstrated prior to performance. Also performed 2 steps without railing demonstrating backward technique.   Wheelchair Mobility    Modified Rankin (Stroke Patients Only)       Balance Overall balance  assessment: Needs assistance Sitting-balance support: Feet supported;Bilateral upper extremity supported Sitting balance-Leahy Scale: Good     Standing balance support: Bilateral upper extremity supported Standing balance-Leahy Scale: Good                              Cognition Arousal/Alertness: Awake/alert Behavior During Therapy: WFL for tasks assessed/performed Overall Cognitive Status: Within Functional Limits for tasks assessed                                        Exercises Other Exercises Other Exercises: Supine ther-ex performed on L LE including AP, glut sets, SAQ, LAQ, hip abd/add, quad sets, and hip add squeezes. All ther-ex performed x 10 reps and HEP given and reviewed.    General Comments        Pertinent Vitals/Pain Pain Assessment: 0-10 Pain Score: 3  Pain Location: L hip Pain Descriptors / Indicators: Operative site guarding Pain Intervention(s): Limited activity within patient's tolerance;Ice applied    Home Living                      Prior Function            PT Goals (current goals can now be found in the care plan section) Acute Rehab PT Goals Patient Stated Goal: to go home PT Goal Formulation: With patient Time For Goal Achievement: 12/18/20 Potential to Achieve Goals: Good Progress towards PT goals: Progressing toward goals    Frequency  BID      PT Plan Current plan remains appropriate    Co-evaluation              AM-PAC PT "6 Clicks" Mobility   Outcome Measure  Help needed turning from your back to your side while in a flat bed without using bedrails?: A Little Help needed moving from lying on your back to sitting on the side of a flat bed without using bedrails?: A Little Help needed moving to and from a bed to a chair (including a wheelchair)?: A Little Help needed standing up from a chair using your arms (e.g., wheelchair or bedside chair)?: A Little Help needed to walk in  hospital room?: A Little Help needed climbing 3-5 steps with a railing? : A Little 6 Click Score: 18    End of Session Equipment Utilized During Treatment: Gait belt Activity Tolerance: Patient tolerated treatment well Patient left: in chair;with chair alarm set Nurse Communication: Mobility status PT Visit Diagnosis: Muscle weakness (generalized) (M62.81);Difficulty in walking, not elsewhere classified (R26.2);Pain Pain - Right/Left: Left Pain - part of body: Hip     Time: 1340-1413 PT Time Calculation (min) (ACUTE ONLY): 33 min  Charges:  $Gait Training: 8-22 mins $Therapeutic Exercise: 8-22 mins                     Greggory Stallion, PT, DPT [phone]    [name] 12/05/2020, 2:46 PM

## 2020-12-05 NOTE — Progress Notes (Signed)
Physical Therapy Treatment Patient Details Name: [name]. MRN: [id number] DOB: 05-Sep-1951 Today's Date: 12/05/2020    History of Present Illness Pt admitted for L THR. History of IVC filter and Afib.    PT Comments    Pt is making good progress towards goals with ability to ambulate around RN station with safe technique. Cues for maintaining hip precautions during turns. Able to recite 2/3 hip precautions. Good endurance with HEP. Will plan to perform stair training this date.   Follow Up Recommendations  Home health PT     Equipment Recommendations  None recommended by PT    Recommendations for Other Services       Precautions / Restrictions Precautions Precautions: Posterior Hip;Fall Precaution Booklet Issued: Yes (comment) Restrictions Weight Bearing Restrictions: Yes LLE Weight Bearing: Weight bearing as tolerated    Mobility  Bed Mobility               General bed mobility comments: not performed as received in recliner  Transfers Overall transfer level: Needs assistance Equipment used: Rolling walker (2 wheeled) Transfers: Sit to/from Stand Sit to Stand: Supervision Stand pivot transfers: Min guard       General transfer comment: Safe technique. UPright posture  Ambulation/Gait Ambulation/Gait assistance: Min guard Gait Distance (Feet): 200 Feet Assistive device: Rolling walker (2 wheeled) Gait Pattern/deviations: Step-through pattern Gait velocity: 10' in 10"   General Gait Details: ambulated around RN station. Cues for gait pattern and able to improve from step to gait to reciprocal pattern. Cues for upright posture   Stairs             Wheelchair Mobility    Modified Rankin (Stroke Patients Only)       Balance Overall balance assessment: Needs assistance Sitting-balance support: Feet supported;Bilateral upper extremity supported Sitting balance-Leahy Scale: Good     Standing balance support: Bilateral upper  extremity supported Standing balance-Leahy Scale: Good                              Cognition Arousal/Alertness: Awake/alert Behavior During Therapy: WFL for tasks assessed/performed Overall Cognitive Status: Within Functional Limits for tasks assessed                                        Exercises Other Exercises Other Exercises: Supine ther-ex performed on L LE including AP, glut sets, hip abd/add, quad sets, and hip add squeezes. All ther-ex performed x 10 reps and HEP given and reviewed.    General Comments        Pertinent Vitals/Pain Pain Assessment: 0-10 Pain Score: 1  Pain Location: L hip Pain Descriptors / Indicators: Operative site guarding Pain Intervention(s): Limited activity within patient's tolerance;Repositioned    Home Living Family/patient expects to be discharged to:: Private residence Living Arrangements: Spouse/significant other Available Help at Discharge: Family Type of Home: House Home Access: Stairs to enter Entrance Stairs-Rails: None Home Layout: One level Home Equipment: Environmental consultant - 2 wheels;Walker - 4 wheels;Cane - single point;Bedside commode      Prior Function Level of Independence: Independent with assistive device(s)      Comments: was primarily using SPC for mobility   PT Goals (current goals can now be found in the care plan section) Acute Rehab PT Goals Patient Stated Goal: to go home PT Goal Formulation: With patient Time For Goal  Achievement: 12/18/20 Potential to Achieve Goals: Good Progress towards PT goals: Progressing toward goals    Frequency    BID      PT Plan Current plan remains appropriate    Co-evaluation              AM-PAC PT "6 Clicks" Mobility   Outcome Measure  Help needed turning from your back to your side while in a flat bed without using bedrails?: A Little Help needed moving from lying on your back to sitting on the side of a flat bed without using bedrails?: A  Little Help needed moving to and from a bed to a chair (including a wheelchair)?: A Little Help needed standing up from a chair using your arms (e.g., wheelchair or bedside chair)?: A Little Help needed to walk in hospital room?: A Little Help needed climbing 3-5 steps with a railing? : A Little 6 Click Score: 18    End of Session Equipment Utilized During Treatment: Gait belt Activity Tolerance: Patient tolerated treatment well Patient left: in chair;with chair alarm set Nurse Communication: Mobility status PT Visit Diagnosis: Muscle weakness (generalized) (M62.81);Difficulty in walking, not elsewhere classified (R26.2);Pain Pain - Right/Left: Left Pain - part of body: Hip     Time: 7654-6503 PT Time Calculation (min) (ACUTE ONLY): 26 min  Charges:  $Gait Training: 8-22 mins $Therapeutic Exercise: 8-22 mins                     Greggory Stallion, PT, DPT [phone]    Ottis Vacha 12/05/2020, 12:47 PM

## 2020-12-05 NOTE — TOC Initial Note (Signed)
Transition of Care (TOC) - Initial/Assessment Note    Patient Details  Name: [name]. MRN: [id number] Date of Birth: 1951-09-28  Transition of [facility]) CM/SW Contact:    Magnus Ivan, LCSW Phone Number: 12/05/2020, 10:58 AM  Clinical Narrative:                CSW met with patient at bedside. Patient reported he lives with his spouse who has been driving him to appointments recently. PCP is [facility]. Pharmacy is [facility]. Patient had [place] in the past but could not recall agency used. Patient has a RW, cane, and raised toilet seat. No SNF history. Explained recommendations for HHPT and 3in1. Patient on [facility] list for HHPT. Patient confirmed he wants to use [facility]. He also confirmed he would like a 3 in 1 to use as a shower chair. CSW made referral to [facility] with [facility] for 3 in 1. No additional needs identified at this time.   Expected Discharge Plan: [place] Barriers to Discharge: Continued Medical Work up   Patient Goals and CMS Choice Patient states their goals for this hospitalization and ongoing recovery are:: home with home health CMS Medicare.gov Compare Post Acute Care list provided to:: Patient Choice offered to / list presented to : Patient  Expected Discharge Plan and Services Expected Discharge Plan: [place]       Living arrangements for the past 2 months: [place]                 DME Arranged: 3-N-1 DME Agency: [facility] Date DME Agency Contacted: 12/05/20   Representative spoke with at DME Agency: Mardene Celeste HH Arranged: PT [place] Agency: [facility]        Prior Living Arrangements/Services Living arrangements for the past 2 months: [place] with:: Spouse Patient language and need for interpreter reviewed:: Yes Do you feel safe going back to the place where you live?: Yes      Need for Family Participation in Patient Care:  Yes (Comment) Care giver support system in place?: Yes (comment) Current home services: DME Criminal Activity/Legal Involvement Pertinent to Current Situation/Hospitalization: No - Comment as needed  Activities of Daily Living   ADL Screening (condition at time of admission) Patient's cognitive ability adequate to safely complete daily activities?: Yes Is the patient deaf or have difficulty hearing?: No Does the patient have difficulty seeing, even when wearing glasses/contacts?: No Does the patient have difficulty concentrating, remembering, or making decisions?: No Patient able to express need for assistance with ADLs?: Yes Does the patient have difficulty dressing or bathing?: No Independently performs ADLs?: Yes (appropriate for developmental age) Communication: Independent Dressing (OT): Independent with device (comment) Grooming: Independent with device (comment) Feeding: Independent Bathing: Independent with device (comment) Toileting: Independent with device (comment) In/Out Bed: Independent with device (comment) Walks in Home: Independent with device (comment) Does the patient have difficulty walking or climbing stairs?: Yes Weakness of Legs: Left (pt states sometimes ble are weak) Weakness of Arms/Hands: None  Permission Sought/Granted Permission sought to share information with : Facility Art therapist granted to share information with : Yes, Verbal Permission Granted     Permission granted to share info w AGENCY: [place] and DME agencies        Emotional Assessment       Orientation: : Oriented to Self,Oriented to Place,Oriented to  Time,Oriented to Situation Alcohol / Substance Use:  Not Applicable Psych Involvement: No (comment)  Admission diagnosis:  Hx of total hip arthroplasty, left [E76.147] Patient Active Problem List   Diagnosis Date Noted  . Hx of total hip arthroplasty, left 12/04/2020  . Chronic pulmonary embolism ([place]) 09/15/2020   . GERD (gastroesophageal reflux disease) 09/14/2020  . Hyperlipidemia 09/14/2020  . Primary osteoarthritis of left hip 08/24/2020  . Insomnia 01/07/2019  . Depression, prolonged 01/07/2019  . Asplenia 08/11/2017  . Heart palpitations 04/01/2017  . PVC's (premature ventricular contractions) 02/14/2017  . Atrial fibrillation with rapid ventricular response ([place]) 06/26/2016  . Chronic insomnia 05/07/2016  . Status post catheter ablation of atrial fibrillation 03/21/2016  . Chest pain with high risk for cardiac etiology 09/08/2015  . Hypokalemia 12/30/2014  . Essential hypertension 12/21/2014  . Paroxysmal atrial fibrillation ([place]) 12/21/2014  . History of cardiac cath 10/13/2014  . Glaucoma 08/24/2014  . Type 2 diabetes mellitus without complications ([place]) 09/13/5746  . Barrett's esophagus determined by biopsy 07/19/2014  . 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 Thyroid nodule 07/12/2014  . Weight gain, abnormal 07/12/2014  . Incisional hernia without mention of obstruction or gangrene 04/20/2014  . Incisional hernia without obstruction or gangrene 03/22/2014   PCP:  Baxter Hire, MD Pharmacy:   [facility], [place] [address] Phone: [phone] Fax: [phone]     Social Determinants of Health (SDOH) Interventions    Readmission Risk Interventions No flowsheet data found.

## 2020-12-05 NOTE — Evaluation (Signed)
Occupational Therapy Evaluation Patient Details Name: [name]. MRN: [id number] DOB: 09-04-1951 Today's Date: 12/05/2020    History of Present Illness Pt admitted for L THR. History of IVC filter and Afib.   Clinical Impression   Patient presenting with decreased I in self care, balance, functional mobility/transfers, endurance, and safety awareness. Patient reports mod I and living with spouse PTA. Pt has all needed equipment at home. OT reviewed posterior hip precautions with demonstrations of examples related to self care tasks. Pt has [place] reacher and home with therapist verbalizing/demonstration to increase I in LB self care.  Patient currently functioning at supervision - min guard with limited pain. Patient will benefit from acute OT to increase overall independence in the areas of ADLs, functional mobility, and safety awareness in order to safely discharge home with family.    Follow Up Recommendations  No OT follow up;Supervision - Intermittent    Equipment Recommendations  None recommended by OT       Precautions / Restrictions Precautions Precautions: Posterior Hip;Fall Restrictions Weight Bearing Restrictions: Yes LLE Weight Bearing: Weight bearing as tolerated      Mobility Bed Mobility               General bed mobility comments: seated in recliner chair upon entering the room    Transfers Overall transfer level: Needs assistance Equipment used: Rolling walker (2 wheeled) Transfers: Sit to/from [employer] Transfers Sit to Stand: Supervision Stand pivot transfers: Min guard       General transfer comment: min cuing for hip precautions    Balance Overall balance assessment: Needs assistance Sitting-balance support: Feet supported;Bilateral upper extremity supported Sitting balance-Leahy Scale: Good     Standing balance support: Bilateral upper extremity supported Standing balance-Leahy Scale: Good                              ADL either performed or assessed with clinical judgement   ADL Overall ADL's : Needs assistance/impaired Eating/Feeding: Independent   Grooming: Wash/dry hands;Wash/dry face;Oral care;Set up;Sitting                   Toilet Transfer: Min guard;BSC;Ambulation   Toileting- Water quality scientist and Hygiene: Min guard;Sit to/from stand               Vision Baseline Vision/History: Wears glasses Wears Glasses: At all times Patient Visual Report: No change from baseline              Pertinent Vitals/Pain Pain Assessment: No/denies pain     Hand Dominance Right   Extremity/Trunk Assessment Upper Extremity Assessment Upper Extremity Assessment: Overall WFL for tasks assessed   Lower Extremity Assessment Lower Extremity Assessment: Generalized weakness       Communication Communication Communication: No difficulties   Cognition Arousal/Alertness: Awake/alert Behavior During Therapy: WFL for tasks assessed/performed Overall Cognitive Status: Within Functional Limits for tasks assessed                                                Home Living Family/patient expects to be discharged to:: Private residence Living Arrangements: Spouse/significant other Available Help at Discharge: Family Type of Home: House Home Access: Stairs to enter Technical brewer of Steps: 2 Entrance Stairs-Rails: None Home Layout: One level     Bathroom Shower/Tub: Tub/shower unit  Home Equipment: [place] - 2 wheels;Walker - 4 wheels;Cane - single point;Bedside commode          Prior Functioning/Environment Level of Independence: Independent with assistive device(s)        Comments: was primarily using SPC for mobility        OT Problem List: Decreased strength;Decreased activity tolerance;Decreased safety awareness;Impaired balance (sitting and/or standing);Decreased knowledge of use of DME or AE;Decreased knowledge of  precautions;Decreased range of motion      OT Treatment/Interventions: Self-care/ADL training;Therapeutic exercise;Therapeutic activities;Energy conservation;DME and/or AE instruction;Patient/family education;Balance training;Manual therapy    OT Goals(Current goals can be found in the care plan section) Acute Rehab OT Goals Patient Stated Goal: to go home OT Goal Formulation: With patient Time For Goal Achievement: 12/19/20 Potential to Achieve Goals: Good ADL Goals Pt Will Perform Grooming: with modified independence;standing Pt Will Perform Lower Body Dressing: with modified independence;sit to/from stand Pt Will Transfer to Toilet: with modified independence;ambulating Pt Will Perform Toileting - Clothing Manipulation and hygiene: with modified independence;sit to/from stand  OT Frequency: Min 2X/week   Barriers to D/C:    none known at this time          AM-PAC OT "6 Clicks" Daily Activity     Outcome Measure Help from another person eating meals?: None Help from another person taking care of personal grooming?: None Help from another person toileting, which includes using toliet, bedpan, or urinal?: A Little Help from another person bathing (including washing, rinsing, drying)?: A Little Help from another person to put on and taking off regular upper body clothing?: None Help from another person to put on and taking off regular lower body clothing?: A Little 6 Click Score: 21   End of Session Equipment Utilized During Treatment: Rolling walker Nurse Communication: Mobility status  Activity Tolerance: Patient tolerated treatment well Patient left: in chair;with chair alarm set;with call bell/phone within reach  OT Visit Diagnosis: Muscle weakness (generalized) (M62.81)                Time: 1607-3710 OT Time Calculation (min): 20 min Charges:  OT General Charges $OT Visit: 1 Visit OT Evaluation $OT Eval Low Complexity: 1 Low OT Treatments $Self Care/Home Management  : 8-22 mins  Darleen Crocker, MS, OTR/L , CBIS ascom [phone]  12/05/20, 12:35 PM

## 2020-12-05 NOTE — Plan of Care (Signed)
Pt sitting on bedside recliner eating supper and later transferred self from recliner to bed. Pt improved on Activity tolerance and ambulated with RW to restroom to void x2.pain level 1/10.Appetite and fluid intake good.L hip honeycomb dressing c/d/I.Hemovac patent with 130cc output collected.no verbal c/o at this time.scd secured and no malfunction noted.

## 2020-12-05 NOTE — Anesthesia Postprocedure Evaluation (Signed)
Anesthesia Post Note  Patient: [name].  Procedure(s) Performed: TOTAL HIP ARTHROPLASTY (Left Hip)  Patient location during evaluation: Nursing Unit Anesthesia Type: Spinal Level of consciousness: oriented and awake and alert Pain management: pain level controlled Vital Signs Assessment: post-procedure vital signs reviewed and stable Respiratory status: spontaneous breathing and respiratory function stable Cardiovascular status: blood pressure returned to baseline and stable Postop Assessment: no headache, no backache, no apparent nausea or vomiting and patient able to bend at knees Anesthetic complications: no   No complications documented.   Last Vitals:  Vitals:   12/05/20 0422 12/05/20 0733  BP: 116/83 128/80  Pulse: 78 81  Resp: 16 15  Temp: 36.7 C 36.8 C  SpO2: 97% 95%    Last Pain:  Vitals:   12/05/20 0733  TempSrc: Oral  PainSc:                  Caryl Asp

## 2020-12-06 LAB — SURGICAL PATHOLOGY

## 2020-12-06 LAB — GLUCOSE, CAPILLARY
Glucose-Capillary: 111 mg/dL — ABNORMAL HIGH (ref 70–99)
Glucose-Capillary: 154 mg/dL — ABNORMAL HIGH (ref 70–99)

## 2020-12-06 MED ORDER — TRAMADOL HCL 50 MG PO TABS: 50.0000 mg | ORAL_TABLET | ORAL | 0 refills | Status: DC | PRN

## 2020-12-06 MED ORDER — CELECOXIB 200 MG PO CAPS: 200.0000 mg | ORAL_CAPSULE | Freq: Two times a day (BID) | ORAL | 0 refills | Status: DC

## 2020-12-06 MED ORDER — OXYCODONE HCL 5 MG PO TABS: 5.0000 mg | ORAL_TABLET | ORAL | 0 refills | Status: DC | PRN

## 2020-12-06 MED ORDER — APIXABAN 2.5 MG PO TABS: 2.5000 mg | ORAL_TABLET | Freq: Two times a day (BID) | ORAL | 0 refills | Status: AC

## 2020-12-06 NOTE — Progress Notes (Signed)
  Subjective: 2 Days Post-Op Procedure(s) (LRB): TOTAL HIP ARTHROPLASTY (Left) Patient reports pain as well-controlled.   Patient is well, and has had no acute complaints or problems Plan is to go Home after hospital stay. Negative for chest pain and shortness of breath Fever: no Gastrointestinal: negative for nausea and vomiting.   Patient has had a bowel movement.  Objective: Vital signs in last 24 hours: Temp:  [97.9 F (36.6 C)-98.3 F (36.8 C)] 97.9 F (36.6 C) (12/22 0343) Pulse Rate:  [72-87] 87 (12/22 0343) Resp:  [15-20] 20 (12/22 0343) BP: (106-151)/(72-96) 151/96 (12/22 0343) SpO2:  [95 %-99 %] 96 % (12/22 0343)  Intake/Output from previous day:  Intake/Output Summary (Last 24 hours) at 12/06/2020 0714 Last data filed at 12/06/2020 0630 Gross per 24 hour  Intake 720 ml  Output 3090 ml  Net -2370 ml    Intake/Output this shift: No intake/output data recorded.  Labs: Recent Labs    12/05/20 1017  HGB 11.9*   Recent Labs    12/05/20 1017  WBC 11.5*  RBC 3.73*  HCT 34.4*  PLT 331   Recent Labs    12/05/20 1017  CREATININE 0.79   No results for input(s): LABPT, INR in the last 72 hours.   EXAM General - Patient is Alert, Appropriate and Oriented Extremity - Neurovascular intact Dorsiflexion/Plantar flexion intact Compartment soft Dressing/Incision -clean, dry, no drainage, Hemovac has been pulled Motor Function - intact, moving foot and toes well on exam.  Cardiovascular- Regular rate and rhythm, no murmurs/rubs/gallops Respiratory- Lungs clear to auscultation bilaterally Gastrointestinal- soft, nontender and active bowel sounds   Assessment/Plan: 2 Days Post-Op Procedure(s) (LRB): TOTAL HIP ARTHROPLASTY (Left) Active Problems:   Hx of total hip arthroplasty, left  Estimated body mass index is 30.57 kg/m as calculated from the following:   Height as of this encounter: 5\' 9"  (1.753 m).   Weight as of this encounter: 93.9 kg. Advance  diet Up with therapy Discharge home with home health    DVT Prophylaxis - Eloquis, TED hose, foot pumps Weight-Bearing as tolerated to left leg  Cassell Smiles, PA-C [facility] 12/06/2020, 7:14 AM

## 2020-12-06 NOTE — Progress Notes (Signed)
Pt accidentally pulled his Hemovac. MD on call/ DR.Poggi made aware. No new orders received at this time. Will continue to monitor the pt.

## 2020-12-06 NOTE — Progress Notes (Signed)
PT Cancellation Note  Patient Details Name: [name]. MRN: [id number] DOB: 05-24-1951   Cancelled Treatment:    Reason Eval/Treat Not Completed: Other (comment). Upon arrival, pt getting dressed preparing for dc. Reviewed education. Patient/wife feel confident going home. Will dc at this time, RN notified.   Miela Desjardin 12/06/2020, 10:36 AM Greggory Stallion, PT, DPT [phone]

## 2020-12-06 NOTE — Plan of Care (Signed)
  Problem: Education: Goal: Knowledge of General Education information will improve Description: Including pain rating scale, medication(s)/side effects and non-pharmacologic comfort measures Outcome: Progressing   Problem: Health Behavior/Discharge Planning: Goal: Ability to manage health-related needs will improve Outcome: Progressing   Problem: Clinical Measurements: Goal: Ability to maintain clinical measurements within normal limits will improve Outcome: Progressing Goal: Will remain free from infection Outcome: Progressing Goal: Diagnostic test results will improve Outcome: Progressing Goal: Respiratory complications will improve Outcome: Progressing Goal: Cardiovascular complication will be avoided Outcome: Progressing   Problem: Activity: Goal: Risk for activity intolerance will decrease Outcome: Progressing   Problem: Nutrition: Goal: Adequate nutrition will be maintained Outcome: Progressing   Problem: Coping: Goal: Level of anxiety will decrease Outcome: Progressing   Problem: Elimination: Goal: Will not experience complications related to bowel motility Outcome: Progressing Goal: Will not experience complications related to urinary retention Outcome: Progressing   Problem: Pain Managment: Goal: General experience of comfort will improve Outcome: Progressing   Problem: Safety: Goal: Ability to remain free from injury will improve Outcome: Progressing   Problem: Education: Goal: Knowledge of the prescribed therapeutic regimen will improve Outcome: Progressing Goal: Understanding of discharge needs will improve Outcome: Progressing Goal: Individualized Educational Video(s) Outcome: Progressing   Problem: Activity: Goal: Ability to avoid complications of mobility impairment will improve Outcome: Progressing Goal: Ability to tolerate increased activity will improve Outcome: Progressing

## 2021-01-02 ENCOUNTER — Other Ambulatory Visit (INDEPENDENT_AMBULATORY_CARE_PROVIDER_SITE_OTHER): Payer: Self-pay | Admitting: Vascular Surgery

## 2021-01-02 DIAGNOSIS — Z86718 Personal history of other venous thrombosis and embolism: Secondary | ICD-10-CM

## 2021-01-02 DIAGNOSIS — Z95828 Presence of other vascular implants and grafts: Secondary | ICD-10-CM

## 2021-01-04 ENCOUNTER — Ambulatory Visit (INDEPENDENT_AMBULATORY_CARE_PROVIDER_SITE_OTHER): Payer: Medicare PPO

## 2021-01-04 ENCOUNTER — Other Ambulatory Visit: Payer: Self-pay

## 2021-01-04 ENCOUNTER — Ambulatory Visit (INDEPENDENT_AMBULATORY_CARE_PROVIDER_SITE_OTHER): Payer: Medicare PPO | Admitting: Vascular Surgery

## 2021-01-04 VITALS — BP 135/86 | HR 85 | Ht 69.0 in | Wt 212.0 lb

## 2021-01-04 DIAGNOSIS — I480 Paroxysmal atrial fibrillation: Secondary | ICD-10-CM

## 2021-01-04 DIAGNOSIS — Z95828 Presence of other vascular implants and grafts: Secondary | ICD-10-CM

## 2021-01-04 DIAGNOSIS — Z86718 Personal history of other venous thrombosis and embolism: Secondary | ICD-10-CM

## 2021-01-04 DIAGNOSIS — Z96642 Presence of left artificial hip joint: Secondary | ICD-10-CM | POA: Diagnosis not present

## 2021-01-04 DIAGNOSIS — I2782 Chronic pulmonary embolism: Secondary | ICD-10-CM | POA: Diagnosis not present

## 2021-01-04 DIAGNOSIS — I10 Essential (primary) hypertension: Secondary | ICD-10-CM | POA: Diagnosis not present

## 2021-01-04 DIAGNOSIS — E782 Mixed hyperlipidemia: Secondary | ICD-10-CM
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
## 2021-01-10 ENCOUNTER — Telehealth (INDEPENDENT_AMBULATORY_CARE_PROVIDER_SITE_OTHER): Payer: Self-pay

## 2021-01-10 NOTE — Telephone Encounter (Signed)
Spoke with the patient and he is scheduled with Dr. Delana Meyer for a IVC filter removal on 01/23/21 with a 9:00 am arrival time to the [facility]. Covid testing on 01/19/21 between 8-1 pm at the [place]. Pre-procedure instructions were discussed and will be mailed.

## 2021-01-19 ENCOUNTER — Other Ambulatory Visit: Payer: Self-pay

## 2021-01-19 ENCOUNTER — Other Ambulatory Visit
Admission: RE | Admit: 2021-01-19 | Discharge: 2021-01-19 | Disposition: A | Discharge status: Home / Self Care | Payer: Medicare PPO | Source: Ambulatory Visit | Attending: Vascular Surgery | Admitting: Vascular Surgery

## 2021-01-19 DIAGNOSIS — Z20822 Contact with and (suspected) exposure to covid-19: Secondary | ICD-10-CM | POA: Diagnosis not present

## 2021-01-19 DIAGNOSIS — Z01812 Encounter for preprocedural laboratory examination: Secondary | ICD-10-CM | POA: Diagnosis present

## 2021-01-20 ENCOUNTER — Encounter (INDEPENDENT_AMBULATORY_CARE_PROVIDER_SITE_OTHER): Payer: Self-pay | Admitting: Vascular Surgery

## 2021-01-20 LAB — SARS CORONAVIRUS 2 (TAT 6-24 HRS): SARS Coronavirus 2: NEGATIVE

## 2021-01-22 ENCOUNTER — Other Ambulatory Visit (INDEPENDENT_AMBULATORY_CARE_PROVIDER_SITE_OTHER): Payer: Self-pay | Admitting: Nurse Practitioner

## 2021-01-23 ENCOUNTER — Ambulatory Visit: Admission: RE | Admit: 2021-01-23 | Payer: Medicare PPO | Source: Home / Self Care | Admitting: Vascular Surgery

## 2021-01-23 ENCOUNTER — Encounter: Admission: RE | Payer: Self-pay | Source: Home / Self Care

## 2021-01-23 DIAGNOSIS — I82409 Acute embolism and thrombosis of unspecified deep veins of unspecified lower extremity: Secondary | ICD-10-CM

## 2021-01-23 SURGERY — IVC FILTER REMOVAL
Anesthesia: Moderate Sedation

## 2021-01-23 MED ORDER — FAMOTIDINE 20 MG PO TABS: 40.0000 mg | ORAL_TABLET | Freq: Once | ORAL | Status: DC | PRN

## 2021-01-23 MED ORDER — DIPHENHYDRAMINE HCL 50 MG/ML IJ SOLN: 50.0000 mg | Freq: Once | INTRAMUSCULAR | Status: DC | PRN

## 2021-01-23 MED ORDER — CEFAZOLIN SODIUM-DEXTROSE 2-4 GM/100ML-% IV SOLN: 2.0000 g | Freq: Once | INTRAVENOUS | Status: DC

## 2021-01-23 MED ORDER — MIDAZOLAM HCL 2 MG/ML PO SYRP: 8.0000 mg | ORAL_SOLUTION | Freq: Once | ORAL | Status: DC | PRN

## 2021-01-23 MED ORDER — SODIUM CHLORIDE 0.9 % IV SOLN: INTRAVENOUS | Status: DC

## 2021-01-23 MED ORDER — METHYLPREDNISOLONE SODIUM SUCC 125 MG IJ SOLR: 125.0000 mg | Freq: Once | INTRAMUSCULAR | Status: DC | PRN

## 2021-01-25 NOTE — Telephone Encounter (Signed)
Spoke with the patient and he is scheduled with Dr. Delana Meyer for a IVC filter removal on 02/13/21 with a 6:45 am arrival to the [facility]. Covid testing on 02/09/21 between 8-1 pm at the [place]. Pre-procedure instructions were discussed and will be mailed.

## 2021-02-09 ENCOUNTER — Other Ambulatory Visit
Admission: RE | Admit: 2021-02-09 | Discharge: 2021-02-09 | Disposition: A | Discharge status: Home / Self Care | Payer: Medicare PPO | Source: Ambulatory Visit | Attending: Vascular Surgery | Admitting: Vascular Surgery

## 2021-02-09 ENCOUNTER — Other Ambulatory Visit: Payer: Self-pay

## 2021-02-09 DIAGNOSIS — Z20822 Contact with and (suspected) exposure to covid-19: Secondary | ICD-10-CM | POA: Diagnosis not present

## 2021-02-09 DIAGNOSIS — Z01812 Encounter for preprocedural laboratory examination: Secondary | ICD-10-CM | POA: Insufficient documentation

## 2021-02-09 LAB — SARS CORONAVIRUS 2 (TAT 6-24 HRS): SARS Coronavirus 2: NEGATIVE

## 2021-02-12 ENCOUNTER — Other Ambulatory Visit (INDEPENDENT_AMBULATORY_CARE_PROVIDER_SITE_OTHER): Payer: Self-pay | Admitting: Nurse Practitioner

## 2021-02-13 ENCOUNTER — Encounter: Payer: Self-pay | Admitting: Vascular Surgery

## 2021-02-13 ENCOUNTER — Other Ambulatory Visit: Payer: Self-pay

## 2021-02-13 ENCOUNTER — Ambulatory Visit
Admission: RE | Admit: 2021-02-13 | Discharge: 2021-02-13 | Disposition: A | Discharge status: Home / Self Care | Payer: Medicare PPO | Attending: Vascular Surgery | Admitting: Vascular Surgery

## 2021-02-13 ENCOUNTER — Encounter
Admission: RE | Disposition: A | Discharge status: Home / Self Care | Payer: Self-pay | Source: Home / Self Care | Attending: Vascular Surgery

## 2021-02-13 DIAGNOSIS — Z833 Family history of diabetes mellitus: Secondary | ICD-10-CM | POA: Insufficient documentation

## 2021-02-13 DIAGNOSIS — Z4589 Encounter for adjustment and management of other implanted devices: Secondary | ICD-10-CM | POA: Insufficient documentation

## 2021-02-13 DIAGNOSIS — N186 End stage renal disease: Secondary | ICD-10-CM | POA: Diagnosis not present

## 2021-02-13 DIAGNOSIS — Z79899 Other long term (current) drug therapy: Secondary | ICD-10-CM | POA: Diagnosis not present

## 2021-02-13 DIAGNOSIS — I2782 Chronic pulmonary embolism: Secondary | ICD-10-CM | POA: Insufficient documentation

## 2021-02-13 DIAGNOSIS — Z888 Allergy status to other drugs, medicaments and biological substances status: Secondary | ICD-10-CM | POA: Insufficient documentation

## 2021-02-13 DIAGNOSIS — Z6831 Body mass index (BMI) 31.0-31.9, adult: Secondary | ICD-10-CM | POA: Insufficient documentation

## 2021-02-13 DIAGNOSIS — Z96642 Presence of left artificial hip joint: Secondary | ICD-10-CM | POA: Insufficient documentation

## 2021-02-13 DIAGNOSIS — E782 Mixed hyperlipidemia: Secondary | ICD-10-CM | POA: Diagnosis not present

## 2021-02-13 DIAGNOSIS — Z9049 Acquired absence of other specified parts of digestive tract: Secondary | ICD-10-CM | POA: Diagnosis not present

## 2021-02-13 DIAGNOSIS — Z85828 Personal history of other malignant neoplasm of skin: Secondary | ICD-10-CM | POA: Insufficient documentation

## 2021-02-13 DIAGNOSIS — Z7901 Long term (current) use of anticoagulants: Secondary | ICD-10-CM | POA: Insufficient documentation

## 2021-02-13 DIAGNOSIS — Z886 Allergy status to analgesic agent status: Secondary | ICD-10-CM | POA: Diagnosis not present

## 2021-02-13 DIAGNOSIS — I480 Paroxysmal atrial fibrillation: Secondary | ICD-10-CM | POA: Insufficient documentation

## 2021-02-13 DIAGNOSIS — Z9081 Acquired absence of spleen: Secondary | ICD-10-CM | POA: Insufficient documentation

## 2021-02-13 DIAGNOSIS — E119 Type 2 diabetes mellitus without complications: Secondary | ICD-10-CM | POA: Insufficient documentation

## 2021-02-13 DIAGNOSIS — Z8249 Family history of ischemic heart disease and other diseases of the circulatory system: Secondary | ICD-10-CM | POA: Diagnosis not present

## 2021-02-13 DIAGNOSIS — I10 Essential (primary) hypertension: Secondary | ICD-10-CM | POA: Insufficient documentation

## 2021-02-13 DIAGNOSIS — Z885 Allergy status to narcotic agent status: Secondary | ICD-10-CM | POA: Diagnosis not present

## 2021-02-13 DIAGNOSIS — Z882 Allergy status to sulfonamides status: Secondary | ICD-10-CM | POA: Insufficient documentation

## 2021-02-13 DIAGNOSIS — Z7984 Long term (current) use of oral hypoglycemic drugs: Secondary | ICD-10-CM | POA: Insufficient documentation

## 2021-02-13 DIAGNOSIS — Z86718 Personal history of other venous thrombosis and embolism: Secondary | ICD-10-CM

## 2021-02-13 HISTORY — PX: IVC FILTER REMOVAL: CATH118246

## 2021-02-13 LAB — GLUCOSE, CAPILLARY: Glucose-Capillary: 121 mg/dL — ABNORMAL HIGH (ref 70–99)

## 2021-02-13 SURGERY — IVC FILTER REMOVAL
Anesthesia: Moderate Sedation

## 2021-02-13 MED ORDER — SODIUM CHLORIDE 0.9 % IV SOLN
INTRAVENOUS | Status: DC
  Administered 2021-02-13: 1000 mL via INTRAVENOUS

## 2021-02-13 MED ORDER — DIPHENHYDRAMINE HCL 50 MG/ML IJ SOLN: 50.0000 mg | Freq: Once | INTRAMUSCULAR | Status: DC | PRN

## 2021-02-13 MED ORDER — METHYLPREDNISOLONE SODIUM SUCC 125 MG IJ SOLR: 125.0000 mg | Freq: Once | INTRAMUSCULAR | Status: DC | PRN

## 2021-02-13 MED ORDER — IODIXANOL 320 MG/ML IV SOLN
INTRAVENOUS | Status: DC | PRN
  Administered 2021-02-13: 10 mL

## 2021-02-13 MED ORDER — CEFAZOLIN SODIUM-DEXTROSE 2-4 GM/100ML-% IV SOLN: 2.0000 g | Freq: Once | INTRAVENOUS | Status: DC

## 2021-02-13 MED ORDER — FENTANYL CITRATE (PF) 100 MCG/2ML IJ SOLN
INTRAMUSCULAR | Status: AC
  Filled 2021-02-13: qty 2

## 2021-02-13 MED ORDER — CEFAZOLIN SODIUM-DEXTROSE 2-4 GM/100ML-% IV SOLN
INTRAVENOUS | Status: AC
  Filled 2021-02-13: qty 100

## 2021-02-13 MED ORDER — MIDAZOLAM HCL 2 MG/2ML IJ SOLN
INTRAMUSCULAR | Status: AC
  Filled 2021-02-13: qty 2

## 2021-02-13 MED ORDER — ONDANSETRON HCL 4 MG/2ML IJ SOLN: 4.0000 mg | Freq: Four times a day (QID) | INTRAMUSCULAR | Status: DC | PRN

## 2021-02-13 MED ORDER — FAMOTIDINE 20 MG PO TABS: 40.0000 mg | ORAL_TABLET | Freq: Once | ORAL | Status: DC | PRN

## 2021-02-13 MED ORDER — FENTANYL CITRATE (PF) 100 MCG/2ML IJ SOLN
INTRAMUSCULAR | Status: DC | PRN
  Administered 2021-02-13: 25 ug via INTRAVENOUS
  Administered 2021-02-13: 50 ug via INTRAVENOUS

## 2021-02-13 MED ORDER — FENTANYL CITRATE (PF) 100 MCG/2ML IJ SOLN: 12.5000 ug | Freq: Once | INTRAMUSCULAR | Status: DC | PRN

## 2021-02-13 MED ORDER — MIDAZOLAM HCL 2 MG/2ML IJ SOLN
INTRAMUSCULAR | Status: DC | PRN
  Administered 2021-02-13: 2 mg via INTRAVENOUS
  Administered 2021-02-13: 0.5 mg via INTRAVENOUS

## 2021-02-13 MED ORDER — MIDAZOLAM HCL 2 MG/ML PO SYRP: 8.0000 mg | ORAL_SOLUTION | Freq: Once | ORAL | Status: DC | PRN

## 2021-02-13 SURGICAL SUPPLY — 7 items
CANNULA 5F STIFF (CANNULA) ×2 IMPLANT
ENSNARE 18-30 (MISCELLANEOUS) ×2 IMPLANT
GLIDEWIRE ADV .035X180CM (WIRE) ×2 IMPLANT
PACK ANGIOGRAPHY (CUSTOM PROCEDURE TRAY) ×2 IMPLANT
SHEATH DRYSEAL FLEX 10FR 33CM (SHEATH) ×1 IMPLANT
SHEATH DRYSEAL FLEX 33 10FR (SHEATH) ×1
TOWEL OR 17X26 4PK STRL BLUE (TOWEL DISPOSABLE) ×2 IMPLANT

## 2021-02-13 NOTE — Interval H&P Note (Signed)
History and Physical Interval Note:  02/13/2021 8:01 AM  [name].  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  The various methods of treatment have been discussed with the patient and family. After consideration of risks, benefits and other options for treatment, the patient has consented to  Procedure(s): IVC FILTER REMOVAL (N/A) as a surgical intervention.  The patient's history has been reviewed, patient examined, no change in status, stable for surgery.  I have reviewed the patient's chart and labs.  Questions were answered to the patient's satisfaction.     [name]

## 2021-02-13 NOTE — Op Note (Signed)
  OPERATIVE NOTE   PRE-OPERATIVE DIAGNOSIS: History of DVT; severe degenerative joint disease status post joint replacement  POST-OPERATIVE DIAGNOSIS: Same  PROCEDURE: 1. Retrieval of IVC Filter 2. Inferior Vena Cavagram  SURGEON: Katha Cabal, M.D.  ANESTHESIA:  Conscious sedation was administered under my direct supervision by the interventional radiology RN. IV Versed plus fentanyl were utilized. Continuous ECG, pulse oximetry and blood pressure was monitored throughout the entire procedure. Conscious sedation was for a total of 14 minutes and 58 seconds.  ESTIMATED BLOOD LOSS: Minimal cc  FINDING(S):inferior vena cava is widely patent filter is in place in good position. Filter is removed without incident  SPECIMEN(S):  IVC filter intact  INDICATIONS:   [name]. is a 70 y.o. male who presents with DVT and PE. The patient has now tolerated anticoagulation for several months. Therefore, the IVC filter is recommended to be removed. The risks and benefits were reviewed with the patient all questions were answered and they agreed to proceed with IVC filter retrieval. Oral anticoagulation will be continued.  DESCRIPTION: After obtaining full informed written consent, the patient was brought back to the Special Procedure Suite and placed in the supine position.  The patient received IV antibiotics prior to induction.  After obtaining adequate sedation, the patient was prepped and draped in the standard fashion and appropriate time out is called.     Ultrasound was placed in a sterile sleeve.The right neck was then imaged with ultrasound.   Jugular vein was identified it is echolucent and homogeneous indicating patency. 1% lidocaine is then infiltrated under ultrasound visualization and subsequently a Seldinger needle is inserted under real-time ultrasound guidance.  J-wire is then advanced into the inferior vena cava under fluoroscopic guidance. With the tip of the  sheath at the confluence of the iliac veins inferior vena caval imaging is performed.  After review of the image the sheath is repositioned to above the filter and the snares introduced. Snares opened and the hook is secured without difficulty. The filter is then collapsed within the sheath and removed without difficulty.  Sheath is removed by pressures held the patient tolerated the procedure well and there were no immediate complications.  Interpretation: inferior vena cava is widely patent filter is in place in good position. Filter is removed without incident.     COMPLICATIONS: None  CONDITION: [name], M.D. [facility] Office: [phone]   02/13/2021, 8:32 AM
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
## 2021-03-05 ENCOUNTER — Other Ambulatory Visit: Payer: Self-pay

## 2021-03-05 ENCOUNTER — Encounter (INDEPENDENT_AMBULATORY_CARE_PROVIDER_SITE_OTHER): Payer: Self-pay | Admitting: Vascular Surgery

## 2021-03-05 ENCOUNTER — Ambulatory Visit (INDEPENDENT_AMBULATORY_CARE_PROVIDER_SITE_OTHER): Payer: Medicare PPO | Admitting: Vascular Surgery

## 2021-03-05 VITALS — BP 145/88 | HR 86 | Resp 16 | Wt 220.8 lb

## 2021-03-05 DIAGNOSIS — M1612 Unilateral primary osteoarthritis, left hip: Secondary | ICD-10-CM | POA: Diagnosis not present

## 2021-03-05 DIAGNOSIS — I2782 Chronic pulmonary embolism: Secondary | ICD-10-CM | POA: Diagnosis not present

## 2021-03-05 DIAGNOSIS — I10 Essential (primary) hypertension: Secondary | ICD-10-CM

## 2021-03-05 DIAGNOSIS — E119 Type 2 diabetes mellitus without complications: Secondary | ICD-10-CM
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
## 2021-03-30 ENCOUNTER — Inpatient Hospital Stay: Admission: RE | Admit: 2021-03-30 | Payer: Medicare PPO | Source: Ambulatory Visit

## 2021-04-02 ENCOUNTER — Encounter: Payer: Self-pay | Admitting: Emergency Medicine

## 2021-04-03 ENCOUNTER — Encounter: Payer: Self-pay | Admitting: *Deleted

## 2021-04-03 ENCOUNTER — Ambulatory Visit
Admission: RE | Admit: 2021-04-03 | Discharge: 2021-04-03 | Disposition: A | Discharge status: Home / Self Care | Payer: Medicare PPO | Attending: Gastroenterology | Admitting: Gastroenterology

## 2021-04-03 ENCOUNTER — Encounter
Admission: RE | Disposition: A | Discharge status: Home / Self Care | Payer: Self-pay | Source: Home / Self Care | Attending: Gastroenterology

## 2021-04-03 ENCOUNTER — Ambulatory Visit: Payer: Medicare PPO | Admitting: Anesthesiology

## 2021-04-03 ENCOUNTER — Other Ambulatory Visit: Payer: Self-pay

## 2021-04-03 DIAGNOSIS — Z885 Allergy status to narcotic agent status: Secondary | ICD-10-CM | POA: Insufficient documentation

## 2021-04-03 DIAGNOSIS — D124 Benign neoplasm of descending colon: Secondary | ICD-10-CM | POA: Insufficient documentation

## 2021-04-03 DIAGNOSIS — Z888 Allergy status to other drugs, medicaments and biological substances status: Secondary | ICD-10-CM | POA: Insufficient documentation

## 2021-04-03 DIAGNOSIS — Z7901 Long term (current) use of anticoagulants: Secondary | ICD-10-CM | POA: Diagnosis not present

## 2021-04-03 DIAGNOSIS — K5730 Diverticulosis of large intestine without perforation or abscess without bleeding: Secondary | ICD-10-CM | POA: Insufficient documentation

## 2021-04-03 DIAGNOSIS — Z980 Intestinal bypass and anastomosis status: Secondary | ICD-10-CM | POA: Insufficient documentation

## 2021-04-03 DIAGNOSIS — Z886 Allergy status to analgesic agent status: Secondary | ICD-10-CM | POA: Diagnosis not present

## 2021-04-03 DIAGNOSIS — Z882 Allergy status to sulfonamides status: Secondary | ICD-10-CM | POA: Insufficient documentation

## 2021-04-03 DIAGNOSIS — Z6831 Body mass index (BMI) 31.0-31.9, adult: Secondary | ICD-10-CM | POA: Diagnosis not present

## 2021-04-03 DIAGNOSIS — K2270 Barrett's esophagus without dysplasia: Secondary | ICD-10-CM | POA: Diagnosis not present

## 2021-04-03 DIAGNOSIS — Z79899 Other long term (current) drug therapy: Secondary | ICD-10-CM | POA: Insufficient documentation

## 2021-04-03 DIAGNOSIS — Z9049 Acquired absence of other specified parts of digestive tract: Secondary | ICD-10-CM | POA: Diagnosis not present

## 2021-04-03 DIAGNOSIS — Z1211 Encounter for screening for malignant neoplasm of colon: Secondary | ICD-10-CM | POA: Diagnosis present

## 2021-04-03 DIAGNOSIS — E119 Type 2 diabetes mellitus without complications: Secondary | ICD-10-CM | POA: Insufficient documentation

## 2021-04-03 DIAGNOSIS — I4891 Unspecified atrial fibrillation: Secondary | ICD-10-CM | POA: Diagnosis not present

## 2021-04-03 DIAGNOSIS — K219 Gastro-esophageal reflux disease without esophagitis: Secondary | ICD-10-CM | POA: Diagnosis not present

## 2021-04-03 DIAGNOSIS — Z86010 Personal history of colonic polyps: Secondary | ICD-10-CM | POA: Diagnosis not present

## 2021-04-03 DIAGNOSIS — D123 Benign neoplasm of transverse colon: Secondary | ICD-10-CM | POA: Diagnosis not present

## 2021-04-03 HISTORY — PX: ESOPHAGOGASTRODUODENOSCOPY (EGD) WITH PROPOFOL: SHX5813

## 2021-04-03 HISTORY — PX: COLONOSCOPY WITH PROPOFOL: SHX5780

## 2021-04-03 LAB — GLUCOSE, CAPILLARY: Glucose-Capillary: 107 mg/dL — ABNORMAL HIGH (ref 70–99)

## 2021-04-03 SURGERY — COLONOSCOPY WITH PROPOFOL
Anesthesia: General

## 2021-04-03 MED ORDER — SODIUM CHLORIDE 0.9 % IV SOLN: INTRAVENOUS | Status: DC

## 2021-04-03 MED ORDER — PROPOFOL 500 MG/50ML IV EMUL
INTRAVENOUS | Status: DC | PRN
  Administered 2021-04-03: 120 ug/kg/min via INTRAVENOUS

## 2021-04-03 MED ORDER — LIDOCAINE HCL (PF) 2 % IJ SOLN
INTRAMUSCULAR | Status: AC
  Filled 2021-04-03: qty 5

## 2021-04-03 MED ORDER — PROPOFOL 500 MG/50ML IV EMUL
INTRAVENOUS | Status: AC
  Filled 2021-04-03: qty 50

## 2021-04-03 MED ORDER — SODIUM CHLORIDE 0.9 % IV SOLN
2.0000 g | Freq: Once | INTRAVENOUS | Status: AC
  Administered 2021-04-03: 2 g via INTRAVENOUS
  Filled 2021-04-03: qty 2

## 2021-04-03 MED ORDER — LIDOCAINE HCL (CARDIAC) PF 100 MG/5ML IV SOSY
PREFILLED_SYRINGE | INTRAVENOUS | Status: DC | PRN
  Administered 2021-04-03: 80 mg via INTRAVENOUS

## 2021-04-03 NOTE — Op Note (Signed)
[facility] Gastroenterology Patient Name: [name] Procedure Date: 04/03/2021 7:33 AM MRN: [id number] Account #: [id number] Date of Birth: 1951/11/01 Admit Type: Outpatient Age: 70 Room: [facility] ENDO ROOM 1 Gender: Male Note Status: Finalized Procedure:             Colonoscopy Indications:           High risk colon cancer surveillance: Personal history                         of multiple (3 or more) adenomas Providers:             Andrey Farmer MD, MD Medicines:             Monitored Anesthesia Care Complications:         No immediate complications. Estimated blood loss:                         Minimal. Procedure:             Pre-Anesthesia Assessment:                        - Prior to the procedure, a History and Physical was                         performed, and patient medications and allergies were                         reviewed. The patient is competent. The risks and                         benefits of the procedure and the sedation options and                         risks were discussed with the patient. All questions                         were answered and informed consent was obtained.                         Patient identification and proposed procedure were                         verified by the physician, the nurse, the anesthetist                         and the technician in the endoscopy suite. Mental                         Status Examination: alert and oriented. Airway                         Examination: normal oropharyngeal airway and neck                         mobility. Respiratory Examination: clear to                         auscultation. CV Examination: normal. Prophylactic  Antibiotics: The patient does not require prophylactic                         antibiotics. Prior Anticoagulants: The patient has                         taken Eliquis (apixaban), last dose was 4 days prior                         to  procedure. ASA Grade Assessment: II - A patient                         with mild systemic disease. After reviewing the risks                         and benefits, the patient was deemed in satisfactory                         condition to undergo the procedure. The anesthesia                         plan was to use monitored anesthesia care (MAC).                         Immediately prior to administration of medications,                         the patient was re-assessed for adequacy to receive                         sedatives. The heart rate, respiratory rate, oxygen                         saturations, blood pressure, adequacy of pulmonary                         ventilation, and response to care were monitored                         throughout the procedure. The physical status of the                         patient was re-assessed after the procedure.                        After obtaining informed consent, the colonoscope was                         passed under direct vision. Throughout the procedure,                         the patient's blood pressure, pulse, and oxygen                         saturations were monitored continuously. The                         Colonoscope was introduced through the anus and  advanced to the the cecum, identified by appendiceal                         orifice and ileocecal valve. The colonoscopy was                         performed without difficulty. The patient tolerated                         the procedure well. The quality of the bowel                         preparation was good. Findings:      The perianal and digital rectal examinations were normal.      A 2 mm polyp was found in the proximal transverse colon. The polyp was       sessile. The polyp was removed with a jumbo cold forceps. Resection and       retrieval were complete. Estimated blood loss was minimal.      A 1 mm polyp was found in the descending  colon. The polyp was sessile.       The polyp was removed with a jumbo cold forceps. Resection and retrieval       were complete. Estimated blood loss was minimal.      A few small-mouthed diverticula were found in the descending colon.      There was evidence of a prior end-to-end colo-colonic anastomosis in the       sigmoid colon. This was patent.      The exam was otherwise without abnormality on direct and retroflexion       views. Impression:            - One 2 mm polyp in the proximal transverse colon,                         removed with a jumbo cold forceps. Resected and                         retrieved.                        - One 1 mm polyp in the descending colon, removed with                         a jumbo cold forceps. Resected and retrieved.                        - Diverticulosis in the descending colon.                        - Patent end-to-end colo-colonic anastomosis.                        - The examination was otherwise normal on direct and                         retroflexion views. Recommendation:        - Discharge patient to home.                        -  Resume previous diet.                        - Resume Eliquis (apixaban) at prior dose today.                        - Await pathology results.                        - Repeat colonoscopy in 7 years for surveillance.                        - Return to referring physician as previously                         scheduled. Procedure Code(s):     --- Professional ---                        [phone], Colonoscopy, flexible; with biopsy, single or                         multiple Diagnosis Code(s):     --- Professional ---                        Z86.010, Personal history of colonic polyps                        K63.5, Polyp of colon                        Z98.0, Intestinal bypass and anastomosis status                        K57.30, Diverticulosis of large intestine without                         perforation or  abscess without bleeding CPT copyright 2019 [employer]. All rights reserved. The codes documented in this report are preliminary and upon coder review may  be revised to meet current compliance requirements. Andrey Farmer MD, MD 04/03/2021 8:14:16 AM Number of Addenda: 0 Note Initiated On: 04/03/2021 7:33 AM Scope Withdrawal Time: 0 hours 7 minutes 5 seconds  Total Procedure Duration: 0 hours 11 minutes 27 seconds  Estimated Blood Loss:  Estimated blood loss was minimal.      [facility]

## 2021-04-03 NOTE — Op Note (Signed)
[facility] Patient Name: [name] Procedure Date: 04/03/2021 7:33 AM MRN: [id number] Account #: [id number] Date of Birth: Nov 24, 1951 Admit Type: Outpatient Age: 70 Room: [facility] ENDO ROOM 1 Gender: Male Note Status: Finalized Procedure:             Upper GI endoscopy Indications:           Gastro-esophageal reflux disease, Barrett's esophagus Providers:             Andrey Farmer MD, MD Medicines:             Monitored Anesthesia Care Complications:         No immediate complications. Estimated blood loss:                         Minimal. Procedure:             Pre-Anesthesia Assessment:                        - Prior to the procedure, a History and Physical was                         performed, and patient medications and allergies were                         reviewed. The patient is competent. The risks and                         benefits of the procedure and the sedation options and                         risks were discussed with the patient. All questions                         were answered and informed consent was obtained.                         Patient identification and proposed procedure were                         verified by the physician, the nurse, the anesthetist                         and the technician in the endoscopy suite. Mental                         Status Examination: alert and oriented. Airway                         Examination: normal oropharyngeal airway and neck                         mobility. Respiratory Examination: clear to                         auscultation. CV Examination: normal. Prophylactic                         Antibiotics: The patient does not require prophylactic  antibiotics. Prior Anticoagulants: The patient has                         taken Eliquis (apixaban), last dose was 4 days prior                         to procedure. ASA Grade Assessment: II - A patient                          with mild systemic disease. After reviewing the risks                         and benefits, the patient was deemed in satisfactory                         condition to undergo the procedure. The anesthesia                         plan was to use monitored anesthesia care (MAC).                         Immediately prior to administration of medications,                         the patient was re-assessed for adequacy to receive                         sedatives. The heart rate, respiratory rate, oxygen                         saturations, blood pressure, adequacy of pulmonary                         ventilation, and response to care were monitored                         throughout the procedure. The physical status of the                         patient was re-assessed after the procedure.                        After obtaining informed consent, the endoscope was                         passed under direct vision. Throughout the procedure,                         the patient's blood pressure, pulse, and oxygen                         saturations were monitored continuously. The Endoscope                         was introduced through the mouth, and advanced to the                         second part of duodenum. The  upper GI endoscopy was                         accomplished without difficulty. The patient tolerated                         the procedure well. Findings:      The esophagus and gastroesophageal junction were examined with white       light and narrow band imaging (NBI). There were esophageal mucosal       changes classified as Barrett's stage C0-M1 per Prague criteria. These       changes involved the mucosa extending to the Z-line. One tongue of       salmon-colored mucosa was present. The maximum longitudinal extent of       these esophageal mucosal changes was 1 cm in length. Mucosa was biopsied       with a cold forceps for histology in 4 quadrants in  the lower third of       the esophagus. One specimen bottle was sent to pathology. Estimated       blood loss was minimal.      The exam of the esophagus was otherwise normal.      The entire examined stomach was normal.      The examined duodenum was normal. Impression:            - Esophageal mucosal changes classified as Barrett's                         stage C0-M1 per Prague criteria. Biopsied.                        - Normal stomach.                        - Normal examined duodenum. Recommendation:        - Perform a colonoscopy today. Procedure Code(s):     --- Professional ---                        [phone], Esophagogastroduodenoscopy, flexible,                         transoral; with biopsy, single or multiple Diagnosis Code(s):     --- Professional ---                        K22.70, Barrett's esophagus without dysplasia                        K21.9, Gastro-esophageal reflux disease without                         esophagitis CPT copyright 2019 American Medical Association. All rights reserved. The codes documented in this report are preliminary and upon coder review may  be revised to meet current compliance requirements. Andrey Farmer MD, MD 04/03/2021 8:11:08 AM Number of Addenda: 0 Note Initiated On: 04/03/2021 7:33 AM Estimated Blood Loss:  Estimated blood loss was minimal.      [facility]
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
## 2021-04-03 NOTE — Interval H&P Note (Signed)
History and Physical Interval Note:  04/03/2021 7:38 AM  [name].  has presented today for surgery, with the diagnosis of HX ADEN POLYP.  The various methods of treatment have been discussed with the patient and family. After consideration of risks, benefits and other options for treatment, the patient has consented to  Procedure(s) with comments: COLONOSCOPY WITH PROPOFOL (N/A) - DM 2 GRM AMPICILLIN ESOPHAGOGASTRODUODENOSCOPY (EGD) WITH PROPOFOL (N/A) as a surgical intervention.  The patient's history has been reviewed, patient examined, no change in status, stable for surgery.  I have reviewed the patient's chart and labs.  Questions were answered to the patient's satisfaction.     Lesly Rubenstein  Ok to proceed with EGD/Colonoscopy

## 2021-04-03 NOTE — H&P (Signed)
Outpatient short stay form Pre-procedure 04/03/2021 7:35 AM Raylene Miyamoto MD, MPH  Primary Physician: Dr. Edwina Barth  Reason for visit:  BE's surveillance and history of polyps  History of present illness:   70 y/o gentleman with history of a. Fib, DM II, and diverticulitis requiring partial colectomy here for EGD for BE's and colonoscopy for surveillance. Had numerous abdominal surgeries after partial colectomy due to hernias. Last took eliquis on Friday. No first degree relatives with GI malignancies.    Current Facility-Administered Medications:  .  0.9 %  sodium chloride infusion, , Intravenous, Continuous, Avery Eustice, Hilton Cork, MD, Last Rate: 20 mL/hr at 04/03/21 0730, New Bag at 04/03/21 0730 .  ampicillin (OMNIPEN) 2 g in sodium chloride 0.9 % 100 mL IVPB, 2 g, Intravenous, Once, Aalijah Lanphere, Hilton Cork, MD  Medications Prior to Admission  Medication Sig Dispense Refill Last Dose  . ascorbic acid (VITAMIN C) 500 MG tablet Take 500 mg by mouth daily.   Past Week at Unknown time  . Cholecalciferol (VITAMIN D) 50 MCG (2000 UT) CAPS Take 2,000 Units by mouth daily.   Past Week at Unknown time  . Coenzyme Q10 200 MG capsule Take 200 mg by mouth daily.    Past Week at Unknown time  . Cyanocobalamin (B-12) 1000 MCG SUBL Place 1,000 mcg under the tongue daily.   Past Week at Unknown time  . diltiazem (CARDIZEM CD) 120 MG 24 hr capsule Take 1 capsule (120 mg total) by mouth daily. 30 capsule 0 04/03/2021 at Unknown time  . diltiazem (CARDIZEM) 30 MG tablet Take 30 mg by mouth 2 (two) times daily.   04/03/2021 at Unknown time  . diphenhydrAMINE (BENADRYL) 25 mg capsule Take 25 mg by mouth every 6 (six) hours as needed.     . fluticasone (FLONASE) 50 MCG/ACT nasal spray Place into both nostrils daily.     Marland Kitchen ibuprofen (ADVIL) 800 MG tablet Take 800 mg by mouth every 8 (eight) hours as needed.     . Iodine Strong, Lugols, (STRONG IODINE) 5 % solution Take 0.2 mLs by mouth 3 (three) times daily.     Marland Kitchen  losartan (COZAAR) 25 MG tablet Take 25 mg by mouth daily.    04/02/2021 at Unknown time  . Magnesium 250 MG TABS Take 250 mg by mouth daily.   Past Week at Unknown time  . magnesium oxide (MAG-OX) 400 MG tablet Take 400 mg by mouth daily.     . metFORMIN (GLUCOPHAGE) 500 MG tablet Take 500 mg by mouth 2 (two) times daily as needed (Depending on blood glucose and intake).   Past Week at Unknown time  . Multiple Vitamin (MULTIVITAMIN) tablet Take 1 tablet by mouth daily.   Past Week at Unknown time  . pantoprazole (PROTONIX) 40 MG tablet Take 40 mg by mouth 2 (two) times daily.   04/02/2021 at Unknown time  . Potassium 99 MG TABS Take 99 mg by mouth daily.   Past Week at Unknown time  . potassium gluconate 595 (99 K) MG TABS tablet Take 595 mg by mouth.     . pregabalin (LYRICA) 50 MG capsule Take 50 mg by mouth in the morning and at bedtime.   04/02/2021 at Unknown time  . Valerian 450 MG CAPS Take 900 mg by mouth at bedtime as needed (Sleep).   04/02/2021 at Unknown time  . apixaban (ELIQUIS) 2.5 MG TABS tablet Take 1 tablet (2.5 mg total) by mouth every 12 (twelve) hours. (Patient taking differently: Take  2.5 mg by mouth daily.) 90 tablet 0 03/30/2021  . celecoxib (CELEBREX) 200 MG capsule Take 1 capsule (200 mg total) by mouth 2 (two) times daily. (Patient not taking: No sig reported) 90 capsule 0   . FIBER PO Take 2 capsules by mouth daily.     . hydrocortisone cream 1 % Apply 1 application topically daily as needed (rash).     Marland Kitchen ketotifen (ZADITOR) 0.025 % ophthalmic solution Place 1 drop into both eyes daily.     . Lecith-Inosi-Chol-B12-Liver (LIVERITE PO) Take 2 tablets by mouth daily.     Marland Kitchen levocetirizine (XYZAL) 5 MG tablet Take 5 mg by mouth at bedtime.      Marland Kitchen OVER THE COUNTER MEDICATION Take 4 capsules by mouth daily. Nutrafol mens hair health     . OVER THE COUNTER MEDICATION Take 1 tablet by mouth daily. Disc-gard+     . OVER THE COUNTER MEDICATION Take 1 capsule by mouth daily. Brain  Health 5 Function Formula     . oxyCODONE (OXY IR/ROXICODONE) 5 MG immediate release tablet Take 1 tablet (5 mg total) by mouth every 4 (four) hours as needed for moderate pain (pain score 4-6). (Patient not taking: No sig reported) 30 tablet 0   . saw palmetto 160 MG capsule Take 160 mg by mouth daily.     . traMADol (ULTRAM) 50 MG tablet Take 1 tablet (50 mg total) by mouth every 4 (four) hours as needed for moderate pain. (Patient not taking: No sig reported) 30 tablet 0      Allergies  Allergen Reactions  . Dolobid [Diflunisal] Other (See Comments)    Muscle spasms Difficulty breathing  . Meloxicam Shortness Of Breath, Anxiety and Other (See Comments)    Insomnia   . Amiodarone Other (See Comments)    hyperthyroidism  . Codeine Nausea Only and Other (See Comments)    headache  . Celebrex [Celecoxib] Rash  . Sulfa Antibiotics Itching and Rash     Past Medical History:  Diagnosis Date  . Arthritis   . Atrial fibrillation ([place])    resolved with 2 abblations in 2017  . Barrett's esophagus   . Cancer ([place])    nonmelanoma skin cancer   . Chronic back pain   . Depression   . Diabetes mellitus without complication ([place])   . Diverticulitis   . Diverticulosis   . Dysrhythmia    controlled by Diltiazem  . GERD (gastroesophageal reflux disease)   . Heart murmur    normal Echo  . History of kidney stones   . Hyperlipidemia   . Hypertension   . Hyperthyroidism   . Hypoglycemia   . IBS (irritable bowel syndrome)   . Irritable bowel syndrome   . Morbid obesity ([place])   . Pneumonia   . Pulmonary embolus [facility])    following [facility] admission July 2013  . Sinusitis   . Thyroid disease   . Thyroid nodule     Review of systems:  Otherwise negative.    Physical Exam  Gen: Alert, oriented. Appears stated age.  HEENT: PERRLA. Lungs: No respiratory distress CV: RRR Abd: soft, benign, no masses Ext: No edema    Planned procedures: Proceed with EGD/colonoscopy. The patient  understands the nature of the planned procedure, indications, risks, alternatives and potential complications including but not limited to bleeding, infection, perforation, damage to internal organs and possible oversedation/side effects from anesthesia. The patient agrees and gives consent to proceed.  Please refer to procedure notes for  findings, recommendations and patient disposition/instructions.     Raylene Miyamoto MD, MPH Gastroenterology 04/03/2021  7:35 AM

## 2021-04-03 NOTE — Transfer of Care (Signed)
Immediate Anesthesia Transfer of Care Note  Patient: [name].  Procedure(s) Performed: COLONOSCOPY WITH PROPOFOL (N/A ) ESOPHAGOGASTRODUODENOSCOPY (EGD) WITH PROPOFOL (N/A )  Patient Location: PACU  Anesthesia Type:General  Level of Consciousness: awake and sedated  Airway & Oxygen Therapy: Patient Spontanous Breathing and Patient connected to face mask oxygen  Post-op Assessment: Report given to RN and Post -op Vital signs reviewed and stable  Post vital signs: Reviewed and stable  Last Vitals:  Vitals Value Taken Time  BP    Temp    Pulse    Resp    SpO2      Last Pain:  Vitals:   04/03/21 0716  TempSrc: Temporal  PainSc: 0-No pain         Complications: No complications documented.

## 2021-04-03 NOTE — Anesthesia Postprocedure Evaluation (Signed)
Anesthesia Post Note  Patient: Neven Fina.  Procedure(s) Performed: COLONOSCOPY WITH PROPOFOL (N/A ) ESOPHAGOGASTRODUODENOSCOPY (EGD) WITH PROPOFOL (N/A )  Patient location during evaluation: PACU Anesthesia Type: General Level of consciousness: awake and alert Pain management: pain level controlled Vital Signs Assessment: post-procedure vital signs reviewed and stable Respiratory status: spontaneous breathing, nonlabored ventilation, respiratory function stable and patient connected to nasal cannula oxygen Cardiovascular status: blood pressure returned to baseline and stable Postop Assessment: no apparent nausea or vomiting Anesthetic complications: no   No complications documented.   Last Vitals:  Vitals:   04/03/21 0716 04/03/21 0810  BP: 138/87 115/73  Pulse: 90 85  Resp: 18 20  Temp: (!) 36.1 C (!) 36.1 C  SpO2: 99%     Last Pain:  Vitals:   04/03/21 0820  TempSrc:   PainSc: 0-No pain                 Molli Barrows

## 2021-04-03 NOTE — Anesthesia Procedure Notes (Addendum)
Performed by: Cook-Martin, Marleen Moret Pre-anesthesia Checklist: Patient identified, Emergency Drugs available, Suction available, Patient being monitored and Timeout performed Patient Re-evaluated:Patient Re-evaluated prior to induction Oxygen Delivery Method: Simple face mask Preoxygenation: Pre-oxygenation with 100% oxygen Induction Type: IV induction Airway Equipment and Method: Bite block Placement Confirmation: CO2 detector and positive ETCO2       

## 2021-04-04 ENCOUNTER — Encounter: Payer: Self-pay | Admitting: Gastroenterology

## 2021-04-04 LAB — SURGICAL PATHOLOGY

## 2022-05-28 ENCOUNTER — Other Ambulatory Visit: Payer: Self-pay | Admitting: Gastroenterology

## 2022-05-31 ENCOUNTER — Other Ambulatory Visit: Payer: Self-pay | Admitting: Gastroenterology

## 2022-05-31 DIAGNOSIS — R1084 Generalized abdominal pain: Secondary | ICD-10-CM

## 2022-05-31 DIAGNOSIS — R160 Hepatomegaly, not elsewhere classified: Secondary | ICD-10-CM

## 2022-05-31 DIAGNOSIS — R198 Other specified symptoms and signs involving the digestive system and abdomen: Secondary | ICD-10-CM

## 2022-05-31 DIAGNOSIS — R7401 Elevation of levels of liver transaminase levels: Secondary | ICD-10-CM

## 2022-06-11 ENCOUNTER — Ambulatory Visit
Admission: RE | Admit: 2022-06-11 | Discharge: 2022-06-11 | Disposition: A | Discharge status: Home / Self Care | Payer: Medicare PPO | Source: Ambulatory Visit | Attending: Gastroenterology | Admitting: Gastroenterology

## 2022-06-11 ENCOUNTER — Ambulatory Visit: Payer: Medicare PPO

## 2022-06-11 DIAGNOSIS — R198 Other specified symptoms and signs involving the digestive system and abdomen: Secondary | ICD-10-CM | POA: Insufficient documentation

## 2022-06-11 DIAGNOSIS — R1084 Generalized abdominal pain: Secondary | ICD-10-CM | POA: Diagnosis present

## 2022-06-11 DIAGNOSIS — R160 Hepatomegaly, not elsewhere classified: Secondary | ICD-10-CM | POA: Insufficient documentation

## 2022-06-11 DIAGNOSIS — R7401 Elevation of levels of liver transaminase levels: Secondary | ICD-10-CM | POA: Insufficient documentation

## 2022-06-11 LAB — POCT I-STAT CREATININE: Creatinine, Ser: 0.7 mg/dL (ref 0.61–1.24)

## 2022-06-11 MED ORDER — IOHEXOL 300 MG/ML  SOLN
100.0000 mL | Freq: Once | INTRAMUSCULAR | Status: AC | PRN
  Administered 2022-06-11: 100 mL via INTRAVENOUS

## 2022-07-17 ENCOUNTER — Other Ambulatory Visit: Payer: Self-pay

## 2022-07-17 ENCOUNTER — Ambulatory Visit: Payer: Self-pay

## 2022-07-17 ENCOUNTER — Emergency Department
Admission: EM | Admit: 2022-07-17 | Discharge: 2022-07-18 | Disposition: A | Discharge status: Home / Self Care | Payer: Medicare PPO | Attending: Emergency Medicine | Admitting: Emergency Medicine

## 2022-07-17 ENCOUNTER — Encounter: Payer: Self-pay | Admitting: Emergency Medicine

## 2022-07-17 DIAGNOSIS — E119 Type 2 diabetes mellitus without complications: Secondary | ICD-10-CM | POA: Insufficient documentation

## 2022-07-17 DIAGNOSIS — F1010 Alcohol abuse, uncomplicated: Secondary | ICD-10-CM | POA: Diagnosis present

## 2022-07-17 DIAGNOSIS — Y906 Blood alcohol level of 120-199 mg/100 ml: Secondary | ICD-10-CM | POA: Diagnosis not present

## 2022-07-17 DIAGNOSIS — F1090 Alcohol use, unspecified, uncomplicated: Secondary | ICD-10-CM

## 2022-07-17 DIAGNOSIS — F1020 Alcohol dependence, uncomplicated: Secondary | ICD-10-CM | POA: Insufficient documentation

## 2022-07-17 DIAGNOSIS — I10 Essential (primary) hypertension: Secondary | ICD-10-CM | POA: Insufficient documentation

## 2022-07-17 LAB — COMPREHENSIVE METABOLIC PANEL
ALT: 37 U/L (ref 0–44)
AST: 41 U/L (ref 15–41)
Albumin: 4.5 g/dL (ref 3.5–5.0)
Alkaline Phosphatase: 37 U/L — ABNORMAL LOW (ref 38–126)
Anion gap: 11 (ref 5–15)
BUN: 12 mg/dL (ref 8–23)
CO2: 20 mmol/L — ABNORMAL LOW (ref 22–32)
Calcium: 9.6 mg/dL (ref 8.9–10.3)
Chloride: 110 mmol/L (ref 98–111)
Creatinine, Ser: 0.85 mg/dL (ref 0.61–1.24)
GFR, Estimated: >60 mL/min (ref >60)
Glucose, Bld: 107 mg/dL — ABNORMAL HIGH (ref 70–99)
Potassium: 3.8 mmol/L (ref 3.5–5.1)
Sodium: 141 mmol/L (ref 135–145)
Total Bilirubin: 0.5 mg/dL (ref 0.3–1.2)
Total Protein: 8.5 g/dL — ABNORMAL HIGH (ref 6.5–8.1)

## 2022-07-17 LAB — CBC
HCT: 37.3 % — ABNORMAL LOW (ref 39.0–52.0)
Hemoglobin: 12.7 g/dL — ABNORMAL LOW (ref 13.0–17.0)
MCH: 31.8 pg (ref 26.0–34.0)
MCHC: 34 g/dL (ref 30.0–36.0)
MCV: 93.5 fL (ref 80.0–100.0)
Platelets: 421 10*3/uL — ABNORMAL HIGH (ref 150–400)
RBC: 3.99 MIL/uL — ABNORMAL LOW (ref 4.22–5.81)
RDW: 15.4 % (ref 11.5–15.5)
WBC: 9.3 10*3/uL (ref 4.0–10.5)
nRBC: 0 % (ref 0.0–0.2)

## 2022-07-17 LAB — URINE DRUG SCREEN, QUALITATIVE (ARMC ONLY)
Amphetamines, Ur Screen: NOT DETECTED
Barbiturates, Ur Screen: NOT DETECTED
Benzodiazepine, Ur Scrn: NOT DETECTED
Cannabinoid 50 Ng, Ur ~~LOC~~: NOT DETECTED
Cocaine Metabolite,Ur ~~LOC~~: NOT DETECTED
MDMA (Ecstasy)Ur Screen: NOT DETECTED
Methadone Scn, Ur: NOT DETECTED
Opiate, Ur Screen: NOT DETECTED
Phencyclidine (PCP) Ur S: NOT DETECTED
Tricyclic, Ur Screen: NOT DETECTED

## 2022-07-17 LAB — ETHANOL: Alcohol, Ethyl (B): 136 mg/dL — ABNORMAL HIGH (ref <10)

## 2022-07-17 LAB — CBG MONITORING, ED: Glucose-Capillary: 88 mg/dL (ref 70–99)

## 2022-07-17 NOTE — ED Notes (Signed)
Red top and urine sent to lab

## 2022-07-17 NOTE — BH Assessment (Signed)
Comprehensive Clinical Assessment (CCA) Note  07/17/2022 [name]. [id number] Recommendations for Services/Supports/Treatments: Pt. requested substance abuse treatment/detox. Pt to be referred out for placement.  [name]. is a 71 year old, English speaking, Black male with a hx of prolonged depression and anxiety. Pt also has a hx of alcohol abuse. Per pt.'s first note: Pt via POV from home. Pt accompanied by his [place] sponsor. Pt here for detox. States last drink was today at 12:30pm unknown how much he drank. Denies any other substances.  Upon assessment, pt. states "I get frustrated that I can no longer do things I used to be able to do which leads to me drinking one and then another, and another." Pt explained that his marriage is strained due to him hiding his drinking from his wife, which leads to worsening feelings of guilt and shame. Pt identified his life transition into retirement, dissatisfaction with life, and boredom as his main stressors. Pt reported that he drinks an unknown amount of alcohol, daily. Pt reported that he feels that he does not metabolize alcohol as well due to having a missing spleen and aging. Pt denies having withdrawal sx. Pt reported symptoms of depression, including hopelessness, which is exacerbated when he drinks alcohol. Pt had a responsive affect and a euthymic mood. Pt was cooperative throughout the interview. Pt had good insight, expressing a motivation to change and stop drinking. Pt was oriented x4. Pt's thoughts and memory were intact. Pt's motor activity was normal and eye contact was good. Pt denied current SI/HI/AV/H. Pt reported that he is deeply spiritual and believes that he sees spiritual entities, even when sober. Pt's UDS/BAL are pending.   Chief Complaint:  Chief Complaint  Patient presents with   Detox   Visit Diagnosis: Alcohol use disorder, moderate    CCA Screening, Triage and Referral (STR)  Patient Reported  Information How did you hear about [place]? Self  Referral name: No data recorded Referral phone number: No data recorded  Whom do you see for routine medical problems? No data recorded Practice/Facility Name: No data recorded Practice/Facility Phone Number: No data recorded Name of Contact: No data recorded Contact Number: No data recorded Contact Fax Number: No data recorded Prescriber Name: No data recorded Prescriber Address (if known): No data recorded  What Is the Reason for Your Visit/Call Today? Pt via POV from home. Pt accompanied by his [place] sponsor. Pt here for detox. States last drink was today at 12:30pm unknown how much he drank.  How Long Has This Been Causing You Problems? > than 6 months  What Do You Feel Would Help You the Most Today? Alcohol or Drug Use Treatment   Have You Recently Been in Any Inpatient Treatment (Hospital/Detox/Crisis Center/28-Day Program)? No data recorded Name/Location of Program/Hospital:No data recorded How Long Were You There? No data recorded When Were You Discharged? No data recorded  Have You Ever Received Services From [facility] Before? No data recorded Who Do You See at [facility]? No data recorded  Have You Recently Had Any Thoughts About Hurting Yourself? Yes  Are You Planning to Commit Suicide/Harm Yourself At This time? No   Have you Recently Had Thoughts About [place]? No  Explanation: No data recorded  Have You Used Any Alcohol or Drugs in the Past 24 Hours? Yes  How Long Ago Did You Use Drugs or Alcohol? No data recorded What Did You Use and How Much? Unknown   Do You Currently Have a  Therapist/Psychiatrist? Yes  Name of Therapist/Psychiatrist: Pt reported, "I see a counselor in [place]"   Have You Been Recently Discharged From Any Office Practice or Programs? No  Explanation of Discharge From Practice/Program: No data recorded    CCA Screening Triage Referral Assessment Type of Contact:  Face-to-Face  Is this Initial or Reassessment? No data recorded Date Telepsych consult ordered in [facility]:  No data recorded Time Telepsych consult ordered in [facility]:  No data recorded  Patient Reported Information Reviewed? No data recorded Patient Left Without Being Seen? No data recorded Reason for Not Completing Assessment: No data recorded  Collateral Involvement: Pt's AA sponsor "John"   Does Patient Have a [place]? No data recorded Name and Contact of Legal Guardian: No data recorded If Minor and Not Living with Parent(s), Who has Custody? n/a  Is CPS involved or ever been involved? Never  Is APS involved or ever been involved? Never   Patient Determined To Be At Risk for Harm To Self or Others Based on Review of Patient Reported Information or Presenting Complaint? No  Method: No data recorded Availability of Means: No data recorded Intent: No data recorded Notification Required: No data recorded Additional Information for Danger to Others Potential: No data recorded Additional Comments for Danger to Others Potential: No data recorded Are There Guns or Other Weapons in Your Home? No data recorded Types of Guns/Weapons: No data recorded Are These Weapons Safely Secured?                            No data recorded Who Could Verify You Are Able To Have These Secured: No data recorded Do You Have any Outstanding Charges, Pending Court Dates, Parole/Probation? No data recorded Contacted To Inform of Risk of Harm To Self or Others: Other: Comment   Location of Assessment: [facility] ED   Does Patient Present under Involuntary Commitment? No  IVC Papers Initial File Date: No data recorded  [place] of Residence: [place]   Patient Currently Receiving the Following Services: Individual Therapy   Determination of Need: Urgent (48 hours)   Options For Referral: Therapeutic Triage Services; ED Referral; ED Visit     CCA Biopsychosocial Intake/Chief  Complaint:  No data recorded Current Symptoms/Problems: No data recorded  Patient Reported Schizophrenia/Schizoaffective Diagnosis in Past: No   Strengths: Pt has good insight, stable housing, a good support network  Preferences: No data recorded Abilities: No data recorded  Type of Services Patient Feels are Needed: No data recorded  Initial Clinical Notes/Concerns: No data recorded  Mental Health Symptoms Depression:   Worthlessness; Tearfulness; Hopelessness   Duration of Depressive symptoms:  Greater than two weeks   Mania:   N/A   Anxiety:    Worrying; Tension; Difficulty concentrating   Psychosis:   None   Duration of Psychotic symptoms: No data recorded  Trauma:   Guilt/shame; Emotional numbing   Obsessions:   Cause anxiety; Recurrent & persistent thoughts/impulses/images; Attempts to suppress/neutralize; Disrupts routine/functioning; Intrusive/time consuming; Good insight   Compulsions:   Good insight; "Driven" to perform behaviors/acts; Intended to reduce stress or prevent another outcome; Disrupts with routine/functioning; Repeated behaviors/mental acts; Intrusive/time consuming   Inattention:   None   Hyperactivity/Impulsivity:   None   Oppositional/Defiant Behaviors:   None   Emotional Irregularity:   Unstable self-image; Intense/unstable relationships; Frantic efforts to avoid abandonment; Mood lability   Other Mood/Personality Symptoms:  No data recorded   Mental Status  Exam Appearance and self-care  Stature:   Average   Weight:   Overweight   Clothing:   Neat/clean   Grooming:   Normal   Cosmetic use:   None   Posture/gait:   Normal   Motor activity:   Not Remarkable   Sensorium  Attention:   Normal   Concentration:   Normal   Orientation:   Situation; Person; Object; Place   Recall/memory:   Normal   Affect and Mood  Affect:   Full Range   Mood:   Euthymic   Relating  Eye contact:   Normal   Facial  expression:   Responsive   Attitude toward examiner:   Cooperative   Thought and Language  Speech flow:  Clear and Coherent   Thought content:   Appropriate to Mood and Circumstances   Preoccupation:   None   Hallucinations:   None   Organization:  No data recorded  [employer] of Knowledge:   Average   Intelligence:   Average   Abstraction:   Normal   Judgement:   Poor   Reality Testing:   Adequate   Insight:   Present; Good   Decision Making:   Impulsive   Social Functioning  Social Maturity:   Responsible   Social Judgement:   Normal   Stress  Stressors:   Transitions   Coping Ability:   Exhausted   Skill Deficits:   Decision making   Supports:   Family; Friends/Service system; Support needed     Religion: Religion/Spirituality Are You A Religious Person?: Yes What is Your Religious Affiliation?:  (Judaism) How Might This Affect Treatment?: Pt reported being grounded in his religious beliefs.  Leisure/Recreation: Leisure / Recreation Do You Have Hobbies?: No  Exercise/Diet: Exercise/Diet Do You Exercise?: No Have You Gained or Lost A Significant Amount of Weight in the Past Six Months?: No Do You Follow a Special Diet?: No Do You Have Any Trouble Sleeping?: Yes Explanation of Sleeping Difficulties: Pt reported that he only sleeps 2 hours at night; often wakes up in night terrors/anxiety attacks.   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, government has Been Impacted by Current Illness: No Has Patient ever Been in Passenger transport manager?: No  Education: Education Is Patient Currently Attending School?: No Last Grade Completed:  (Not assessed) Did Physicist, medical?:  (Not assessed) Did You Have An Individualized Education Program (IIEP): No Did You Have Any Difficulty At School?: No Patient's Education Has Been Impacted by Current Illness:  No   CCA Family/Childhood History Family and Relationship History: Family history Marital status: Married Number of Years Married: 36 What types of issues is patient dealing with in the relationship?: Pt's marriage is strained due to his secrecy about his drinking habits. Additional relationship information: Pt has feelings of extreme guilt and shame when he drinks and hides it from his wife. Does patient have children?:  (UTA)  Childhood History:  Childhood History By whom was/is the patient raised?: Mother Did patient suffer any verbal/emotional/physical/sexual abuse as a child?: No Did patient suffer from severe childhood neglect?: No Has patient ever been sexually abused/assaulted/raped as an adolescent or adult?: No Was the patient ever a victim of a crime or a disaster?: No Witnessed domestic violence?: No Has patient been affected by domestic violence as an adult?: No  Child/Adolescent Assessment:     CCA Substance Use Alcohol/Drug Use: Alcohol / Drug Use Pain Medications: See MAR Prescriptions: See  MAR Over the Counter: See MAR History of alcohol / drug use?: Yes Longest period of sobriety (when/how long): Alcohol Negative Consequences of Use: Personal relationships Withdrawal Symptoms: None Substance #1 Name of Substance 1: Alcohol                       ASAM's:  Six Dimensions of Multidimensional Assessment  Dimension 1:  Acute Intoxication and/or Withdrawal Potential:   Dimension 1:  Description of individual's past and current experiences of substance use and withdrawal: Pt has a hx of chronic acohol abuse  Dimension 2:  Biomedical Conditions and Complications:   Dimension 2:  Description of patient's biomedical conditions and  complications: Pt has a missing spleen  Dimension 3:  Emotional, Behavioral, or Cognitive Conditions and Complications:  Dimension 3:  Description of emotional, behavioral, or cognitive conditions and complications: Pt has a  hx of anxiety and depression  Dimension 4:  Readiness to Change:     Dimension 5:  Relapse, Continued use, or Continued Problem Potential:     Dimension 6:  Recovery/Living Environment:     ASAM Severity Score: ASAM's Severity Rating Score: 18  ASAM Recommended Level of Treatment: ASAM Recommended Level of Treatment: Level III Residential Treatment   Substance use Disorder (SUD) Substance Use Disorder (SUD)  Checklist Symptoms of Substance Use: Continued use despite having a persistent/recurrent physical/psychological problem caused/exacerbated by use, Continued use despite persistent or recurrent social, interpersonal problems, caused or exacerbated by use, Persistent desire or unsuccessful efforts to cut down or control use, Presence of craving or strong urge to use, Substance(s) often taken in larger amounts or over longer times than was intended  Recommendations for Services/Supports/Treatments: Recommendations for Services/Supports/Treatments Recommendations For Services/Supports/Treatments: Detox, Inpatient Hospitalization  DSM5 Diagnoses: Patient Active Problem List   Diagnosis Date Noted   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   Chronic pulmonary embolism ([place]) 09/15/2020   GERD (gastroesophageal reflux disease) 09/14/2020   Hyperlipidemia 09/14/2020   Primary osteoarthritis of left hip 08/24/2020   Insomnia 01/07/2019   Depression, prolonged 01/07/2019   Asplenia 08/11/2017   Heart palpitations 04/01/2017   PVC's (premature ventricular contractions) 02/14/2017   Atrial fibrillation with rapid ventricular response ([place]) 06/26/2016   Chronic insomnia 05/07/2016   Status post catheter ablation of atrial fibrillation 03/21/2016   Chest pain with high risk for cardiac etiology 09/08/2015   Hypokalemia 12/30/2014   Essential hypertension 12/21/2014   Paroxysmal atrial fibrillation ([place]) 12/21/2014   History of cardiac cath 10/13/2014   Glaucoma 08/24/2014   Type  2 diabetes mellitus without complications ([place]) 13/07/6577   Barrett's esophagus determined by biopsy 07/19/2014   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

## 2022-07-17 NOTE — Discharge Instructions (Addendum)
Go to [facility]

## 2022-07-17 NOTE — ED Triage Notes (Signed)
Pt via POV from home. Pt accompanied by his [place] sponsor. Pt here for detox. States last drink was today at 12:30pm unknown how much he drank. Denies any other substances.

## 2022-07-17 NOTE — ED Notes (Signed)
Pt given a cup of water 

## 2022-07-17 NOTE — Telephone Encounter (Signed)
  Chief Complaint: Looking for outpatient alcohol abuse program Symptoms:  Frequency:  Pertinent Negatives: Patient denies  Disposition: '[]'$ ED /'[]'$ Urgent Care (no appt availability in office) / '[]'$ Appointment(In office/virtual)/ '[]'$  [place] Virtual [facility]/ '[x]'$ Home Care/ '[]'$ Refused Recommended Disposition /'[]'$ [place] [facility]/ '[]'$  Follow-up with PCP Additional Notes: Gave pt the number for  Summary: pt needs help find Alcohol Abuse Center. Gave number for behavioral health. Offered to transfer. Pt states he will call himself. PT will call back if unable to find a program with [facility] help.   Pt was given [facility] # by [facility]. Has call [facility] and given this #  [facility] call.  Pt already has a PCP but is trying to reach out to other sources also, states his PCP suggestion yesterday did not work out. Pt is searching for Outpatient Care for Alcohol Abuse. Pls fu, pt is desperate to find a center and states will travel. FU at [phone]      Reason for Disposition  General information question, no triage required and triager able to answer question  Answer Assessment - Initial Assessment Questions 1. REASON FOR CALL or QUESTION: "What is your reason for calling today?" or "How can I best help you?" or "What question do you have that I can help answer?"     Pt is looking for an out pt. Alcohol abuse program.  Protocols used: Information Only Call - No Triage-A-AH

## 2022-07-17 NOTE — ED Notes (Signed)
Pt to ED for detox, pt here with AA sponsor. Pt last drink was this am, pt took 2 1.7 oz bottles of rum. Pt states he usually drinks everyday. Pt recently was sober for 30 days and states "I couldn't make it to 60 days, I was stupid today". Pt denies any recent stressors, pt states he "gets depressed at home and I start drinking".   Pt is A&ox4, calm and cooperative upon assessment.

## 2022-07-17 NOTE — ED Provider Triage Note (Signed)
  Emergency Medicine Provider Triage Evaluation Note  Randell Loop., a 71 y.o. male  was evaluated in triage.  Pt complains of alcohol detox.  He presents with his [place] sponsor with request for detoxification.  He denies any other complaints at this time denies any recent injury, trauma, falls.  Review of Systems  Positive: EtoH use disorder Negative: FCS, CP  Physical Exam  There were no vitals taken for this visit. Gen:   Awake, no distress  NAD Resp:  Normal effort CTA MSK:   Moves extremities without difficulty  ABD:  Soft, nontender  Medical Decision Making  Medically screening exam initiated at 4:28 PM.  Appropriate orders placed.  Bettye Boeck Dineen Kid. was informed that the remainder of the evaluation will be completed by another provider, this initial triage assessment does not replace that evaluation, and the importance of remaining in the ED until their evaluation is complete.  Patient to the ED for evaluation of alcohol detox.  He denies any complaints at this time but he presents with his AA sponsor requesting detoxification.   Melvenia Needles, PA-C 07/17/22 1651

## 2022-07-17 NOTE — ED Provider Notes (Signed)
   [facility] Provider Note    Event Date/Time   First MD Initiated Contact with Patient 07/17/22 1957     (approximate)  History   Chief Complaint: Detox  HPI  [name]. is a 71 y.o. male with a past medical history of alcoholism, diabetes, hypertension, hyperlipidemia, presents emergency department for alcohol detox.  According to the patient who is here with his [place] sponsor.  Patient had been doing well with his sobriety began approximate 30 days plus however over the past month or 2 he has been slipping up and drinking alcohol from time to time.  They attempted to go to [facility] this morning but due to insurance reasons they returned away from [place].  The sponsor states the patient and called him at 12:30 PM and the patient had already been drinking alcohol once again and appeared depressed.  I brought him to the emergency department hoping to get him placed into a detox facility.  No other substances.  No other medical complaints currently.  Physical Exam   Triage Vital Signs: ED Triage Vitals  Enc Vitals Group     BP 07/17/22 1647 (!) 141/82     Pulse Rate 07/17/22 1647 99     Resp 07/17/22 1647 16     Temp 07/17/22 1647 98 F (36.7 C)     Temp Source 07/17/22 1647 Oral     SpO2 07/17/22 1647 96 %     Weight 07/17/22 1645 203 lb (92.1 kg)     Height 07/17/22 1645 '5\' 9"'$  (1.753 m)     Head Circumference --      Peak Flow --      Pain Score 07/17/22 1644 0     Pain Loc --      Pain Edu? --      Excl. in [place]? --     Most recent vital signs: Vitals:   07/17/22 1647  BP: (!) 141/82  Pulse: 99  Resp: 16  Temp: 98 F (36.7 C)  SpO2: 96%    General: Awake, no distress.  CV:  Good peripheral perfusion.  Regular rate and rhythm  Resp:  Normal effort.  Equal breath sounds bilaterally.  Abd:  No distention.  Soft, nontender.  No rebound or guarding.   ED Results / Procedures / Treatments   MEDICATIONS ORDERED IN ED: Medications - No  data to display   IMPRESSION / MDM / [place] / ED COURSE  I reviewed the triage vital signs and the nursing notes.  Patient's presentation is most consistent with exacerbation of chronic illness.  Patient with a past medical history of alcoholism who presents to the emergency department with alcohol relapse and worsening depression and anxiety.  Hoping to get into a detox facility.  Patient is here with his [place] sponsor.  No medical complaints currently.  We will have TTS evaluate the patient.  Reassuring CBC, reassuring chemistry.  I added on ethanol level last drink was approximately 12:30 PM.  CBC and chemistry are reassuring.  TTS is seen the patient and will be looking for a bed for him at a detox facility.  FINAL CLINICAL IMPRESSION(S) / ED DIAGNOSES   Alcohol abuse  Note:  This document was prepared using Dragon voice recognition software and may include unintentional dictation errors.   Harvest Dark, MD 07/17/22 [phone]

## 2022-07-18 LAB — CBG MONITORING, ED: Glucose-Capillary: 153 mg/dL — ABNORMAL HIGH (ref 70–99)

## 2022-07-18 NOTE — ED Provider Notes (Signed)
-----------------------------------------   6:53 AM on 07/18/2022 -----------------------------------------   Blood pressure (!) 147/79, pulse 92, temperature 98.2 F (36.8 C), temperature source Oral, resp. rate 20, height 1.753 m ('5\' 9"'$ ), weight 92.1 kg, SpO2 98 %.  The patient is calm and cooperative at this time.  There have been no acute events since the last update.  Awaiting placement at a local detox facility.   Hinda Kehr, MD 07/18/22 [phone]

## 2022-07-18 NOTE — ED Notes (Signed)
Pt given breakfast tray

## 2022-07-18 NOTE — BH Assessment (Signed)
Referral information for Psychiatric Hospitalization faxed to:  [facility] [phone])  [place] [phone]) [phone] crisis center/detox facility) [phone])  [place] [phone] or [phone])  [facility] [phone] -or- [phone]),   [facility] [phone])  [facility] [phone])    [facility] [phone])

## 2022-07-18 NOTE — ED Notes (Signed)
Pt given carton of milk

## 2022-07-18 NOTE — BH Assessment (Signed)
Patient accepted to [facility]).  ___________________________ [facility] [address]  [place],  [address] [phone]

## 2022-07-18 NOTE — ED Notes (Signed)
Pt allowed to take dose of losartan ('50mg'$ ) and diltiazem ('180mg'$ ) from home bottles- okayed by Dr Jari Pigg prior to administration

## 2022-08-28 ENCOUNTER — Other Ambulatory Visit: Payer: Self-pay

## 2022-08-28 ENCOUNTER — Emergency Department
Admission: EM | Admit: 2022-08-28 | Discharge: 2022-08-29 | Disposition: A | Discharge status: Home / Self Care | Payer: Medicare PPO | Attending: Emergency Medicine | Admitting: Emergency Medicine

## 2022-08-28 DIAGNOSIS — F1010 Alcohol abuse, uncomplicated: Secondary | ICD-10-CM | POA: Diagnosis present

## 2022-08-28 DIAGNOSIS — Z85828 Personal history of other malignant neoplasm of skin: Secondary | ICD-10-CM | POA: Insufficient documentation

## 2022-08-28 DIAGNOSIS — E119 Type 2 diabetes mellitus without complications: Secondary | ICD-10-CM | POA: Diagnosis not present

## 2022-08-28 DIAGNOSIS — I10 Essential (primary) hypertension: Secondary | ICD-10-CM | POA: Insufficient documentation

## 2022-08-28 DIAGNOSIS — Z7901 Long term (current) use of anticoagulants: Secondary | ICD-10-CM | POA: Insufficient documentation

## 2022-08-28 DIAGNOSIS — Z79899 Other long term (current) drug therapy: Secondary | ICD-10-CM | POA: Insufficient documentation

## 2022-08-28 LAB — SALICYLATE LEVEL: Salicylate Lvl: <7 mg/dL — ABNORMAL LOW (ref 7.0–30.0)

## 2022-08-28 LAB — URINE DRUG SCREEN, QUALITATIVE (ARMC ONLY)
Amphetamines, Ur Screen: NOT DETECTED
Barbiturates, Ur Screen: NOT DETECTED
Benzodiazepine, Ur Scrn: POSITIVE — AB
Cannabinoid 50 Ng, Ur ~~LOC~~: NOT DETECTED
Cocaine Metabolite,Ur ~~LOC~~: NOT DETECTED
MDMA (Ecstasy)Ur Screen: NOT DETECTED
Methadone Scn, Ur: NOT DETECTED
Opiate, Ur Screen: NOT DETECTED
Phencyclidine (PCP) Ur S: NOT DETECTED
Tricyclic, Ur Screen: NOT DETECTED

## 2022-08-28 LAB — COMPREHENSIVE METABOLIC PANEL
ALT: 19 U/L (ref 0–44)
AST: 26 U/L (ref 15–41)
Albumin: 4 g/dL (ref 3.5–5.0)
Alkaline Phosphatase: 41 U/L (ref 38–126)
Anion gap: 11 (ref 5–15)
BUN: 11 mg/dL (ref 8–23)
CO2: 21 mmol/L — ABNORMAL LOW (ref 22–32)
Calcium: 8.9 mg/dL (ref 8.9–10.3)
Chloride: 109 mmol/L (ref 98–111)
Creatinine, Ser: 0.86 mg/dL (ref 0.61–1.24)
GFR, Estimated: >60 mL/min (ref >60)
Glucose, Bld: 87 mg/dL (ref 70–99)
Potassium: 3.9 mmol/L (ref 3.5–5.1)
Sodium: 141 mmol/L (ref 135–145)
Total Bilirubin: 0.5 mg/dL (ref 0.3–1.2)
Total Protein: 7.7 g/dL (ref 6.5–8.1)

## 2022-08-28 LAB — CBC
HCT: 33.4 % — ABNORMAL LOW (ref 39.0–52.0)
Hemoglobin: 11.5 g/dL — ABNORMAL LOW (ref 13.0–17.0)
MCH: 31.8 pg (ref 26.0–34.0)
MCHC: 34.4 g/dL (ref 30.0–36.0)
MCV: 92.3 fL (ref 80.0–100.0)
Platelets: 432 10*3/uL — ABNORMAL HIGH (ref 150–400)
RBC: 3.62 MIL/uL — ABNORMAL LOW (ref 4.22–5.81)
RDW: 14.7 % (ref 11.5–15.5)
WBC: 8.3 10*3/uL (ref 4.0–10.5)
nRBC: 0 % (ref 0.0–0.2)

## 2022-08-28 LAB — ACETAMINOPHEN LEVEL: Acetaminophen (Tylenol), Serum: <10 ug/mL — ABNORMAL LOW (ref 10–30)

## 2022-08-28 LAB — ETHANOL: Alcohol, Ethyl (B): 266 mg/dL — ABNORMAL HIGH (ref <10)

## 2022-08-28 MED ORDER — LORAZEPAM 2 MG PO TABS
0.0000 mg | ORAL_TABLET | Freq: Four times a day (QID) | ORAL | Status: DC
  Administered 2022-08-29: 1 mg via ORAL
  Filled 2022-08-28: qty 1

## 2022-08-28 MED ORDER — THIAMINE MONONITRATE 100 MG PO TABS
100.0000 mg | ORAL_TABLET | Freq: Every day | ORAL | Status: DC
  Administered 2022-08-29: 100 mg via ORAL
  Filled 2022-08-28: qty 1

## 2022-08-28 MED ORDER — LORAZEPAM 2 MG/ML IJ SOLN: 0.0000 mg | Freq: Two times a day (BID) | INTRAMUSCULAR | Status: DC

## 2022-08-28 MED ORDER — THIAMINE HCL 100 MG/ML IJ SOLN: 100.0000 mg | Freq: Every day | INTRAMUSCULAR | Status: DC

## 2022-08-28 MED ORDER — LORAZEPAM 2 MG PO TABS: 0.0000 mg | ORAL_TABLET | Freq: Two times a day (BID) | ORAL | Status: DC

## 2022-08-28 MED ORDER — LORAZEPAM 2 MG/ML IJ SOLN: 0.0000 mg | Freq: Four times a day (QID) | INTRAMUSCULAR | Status: DC

## 2022-08-28 NOTE — ED Notes (Signed)
Vol/psych consult ordered/pending

## 2022-08-28 NOTE — ED Notes (Signed)
Spouse requests to be called at [phone]. Patient gives verbal permission to talk to wife on phone for collateral.

## 2022-08-28 NOTE — ED Notes (Addendum)
Pt dressed out with this RN and Shawn NT. Belongings include: Shoes White socks Radiographer, therapeutic boxers  [employer], Water engineer given to wife

## 2022-08-28 NOTE — ED Provider Notes (Signed)
[facility] Provider Note    Event Date/Time   First MD Initiated Contact with Patient 08/28/22 2011     (approximate)   History   Chief Complaint Alcohol Problem   HPI  Keith Schrom. is a 71 y.o. male with a past medical history of hypertension, hyperlipidemia, diabetes, alcohol abuse, atrial fibrillation on Eliquis, and PE who presents to the ED for alcohol abuse.  Patient reports that he has been to detox for alcohol multiple times in the past couple of months, but recently began drinking again.  He states that he drinks most days but not quite every day, typically a about a pint of liquor daily.  He denies any drug use, specifically denies any suicidal or homicidal ideation.  He denies any medical complaints at this time, states he is feeling well and primarily interested in detox.  His wife had reported in triage that he had made suicidal statements at times, but he adamantly denies this.     Physical Exam   Triage Vital Signs: ED Triage Vitals  Enc Vitals Group     BP 08/28/22 1737 106/61     Pulse Rate 08/28/22 1737 78     Resp 08/28/22 1737 16     Temp 08/28/22 1737 98.8 F (37.1 C)     Temp Source 08/28/22 1737 Oral     SpO2 08/28/22 1746 96 %     Weight 08/28/22 1746 215 lb (97.5 kg)     Height 08/28/22 1746 '5\' 9"'$  (1.753 m)     Head Circumference --      Peak Flow --      Pain Score 08/28/22 1746 0     Pain Loc --      Pain Edu? --      Excl. in [place]? --     Most recent vital signs: Vitals:   08/28/22 2010 08/28/22 2128  BP: 128/74 128/74  Pulse: 67 64  Resp: 18   Temp: 98 F (36.7 C)   SpO2: 95%     Constitutional: Alert and oriented. Eyes: Conjunctivae are normal. Head: Atraumatic. Nose: No congestion/rhinnorhea. Mouth/Throat: Mucous membranes are moist.  Cardiovascular: Normal rate, regular rhythm. Grossly normal heart sounds.  2+ radial pulses bilaterally. Respiratory: Normal respiratory effort.  No  retractions. Lungs CTAB. Gastrointestinal: Soft and nontender. No distention. Musculoskeletal: No lower extremity tenderness nor edema.  Neurologic:  Normal speech and language. No gross focal neurologic deficits are appreciated.    ED Results / Procedures / Treatments   Labs (all labs ordered are listed, but only abnormal results are displayed) Labs Reviewed  COMPREHENSIVE METABOLIC PANEL - Abnormal; Notable for the following components:      Result Value   CO2 21 (*)    All other components within normal limits  ETHANOL - Abnormal; Notable for the following components:   Alcohol, Ethyl (B) 266 (*)    All other components within normal limits  SALICYLATE LEVEL - Abnormal; Notable for the following components:   Salicylate Lvl <8.3 (*)    All other components within normal limits  ACETAMINOPHEN LEVEL - Abnormal; Notable for the following components:   Acetaminophen (Tylenol), Serum <10 (*)    All other components within normal limits  CBC - Abnormal; Notable for the following components:   RBC 3.62 (*)    Hemoglobin 11.5 (*)    HCT 33.4 (*)    Platelets 432 (*)    All other components within normal limits  URINE DRUG SCREEN, QUALITATIVE ([facility] ONLY) - Abnormal; Notable for the following components:   Benzodiazepine, Ur Scrn POSITIVE (*)    All other components within normal limits    PROCEDURES:  Critical Care performed: No  Procedures   MEDICATIONS ORDERED IN ED: Medications  LORazepam (ATIVAN) injection 0-4 mg ( Intravenous Not Given 08/28/22 2128)    Or  LORazepam (ATIVAN) tablet 0-4 mg ( Oral See Alternative 08/28/22 2128)  LORazepam (ATIVAN) injection 0-4 mg (has no administration in time range)    Or  LORazepam (ATIVAN) tablet 0-4 mg (has no administration in time range)  thiamine (VITAMIN B1) tablet 100 mg (has no administration in time range)    Or  thiamine (VITAMIN B1) injection 100 mg (has no administration in time range)     IMPRESSION / MDM /  ASSESSMENT AND PLAN / ED COURSE  I reviewed the triage vital signs and the nursing notes.                              71 y.o. male with past medical history of hypertension, hyperlipidemia, diabetes, alcohol abuse, atrial fibrillation on Eliquis, and PE who presents to the ED complaining of regular alcohol abuse, suicidal ideation reported by patient's wife although he denies this.  Patient's presentation is most consistent with acute presentation with potential threat to life or bodily function.  Differential diagnosis includes, but is not limited to, alcohol intoxication, alcohol withdrawal, electrolyte abnormality, AKI, suicidal ideation, psychosis.  Patient nontoxic-appearing and in no acute distress, vital signs are unremarkable.  Labs significant for elevated blood alcohol but no significant electrolyte abnormality or AKI, LFTs are also unremarkable.  Patient without significant anemia or leukocytosis, Tylenol and salicylate levels are undetectable.  He denies any medical complaints and may be medically cleared for psychiatric evaluation.  He adamantly denies any suicidal ideation, he is calm and cooperative here in the ED and we will maintain voluntary status as he is willing to stay for psychiatric evaluation.  The patient has been placed in psychiatric observation due to the need to provide a safe environment for the patient while obtaining psychiatric consultation and evaluation, as well as ongoing medical and medication management to treat the patient's condition.  The patient has not been placed under full IVC at this time.      FINAL CLINICAL IMPRESSION(S) / ED DIAGNOSES   Final diagnoses:  Alcohol abuse     Rx / DC Orders   ED Discharge Orders     None        Note:  This document was prepared using Dragon voice recognition software and may include unintentional dictation errors.   Blake Divine, MD 08/29/22 Laureen Abrahams

## 2022-08-28 NOTE — ED Triage Notes (Signed)
Pt to ED With wife. States he needs help with depression and alcohol. Denies si/hi.Per wife, pt has been telling her he wants to harm himself. Denies plan. Prozac was doubled yesterday. Pt answering orientation questions appropriately, however has confused look on face when being talked to Wife seems very upset and frustrated in triage.

## 2022-08-29 DIAGNOSIS — F1010 Alcohol abuse, uncomplicated: Secondary | ICD-10-CM | POA: Diagnosis present

## 2022-08-29 NOTE — ED Notes (Signed)
Discharged with wife. Wife had all of belongings. Pt left in paper scrubs. Pt allowed RN to review discharge papers with her.  Verified correct patient and correct discharge papers given. Pt alert and oriented X 4, stable for discharge. RR even and unlabored, color WNL. Discussed discharge instructions and follow-up as directed. Discharge medications discussed, when prescribed. Pt had opportunity to ask questions, and RN available to provide patient and/or family education.

## 2022-08-29 NOTE — BH Assessment (Signed)
Comprehensive Clinical Assessment (CCA) Note  08/29/2022 [name] [name] [id number]  Chief Complaint: Patient is a 71 year old male presenting to [facility] ED volountarily. Per triage note Pt to ED With wife. States he needs help with depression and alcohol. Denies si/hi.Per wife, pt has been telling her he wants to harm himself. Denies plan. Prozac was doubled yesterday. Pt answering orientation questions appropriately, however has confused look on face when being talked to. Wife seems very upset and frustrated in triage. During assessment patient appears alert and oriented x4, calm and cooperative. Patient reports "I got a habit of drinking too much once in a while." Patient reports that he does not drink daily but does report at least 3-4 times a week. Patient reports that he was discussing his SI "from a few months ago" that made his wife worried. Patient denies SI/HI/AH/VH.   Per Psyc NP Anette Riedel patient to be observed overnight and discharged in the morning Chief Complaint  Patient presents with   Alcohol Problem   Visit Diagnosis: Alcohol abuse, Subsance induced h    CCA Screening, Triage and Referral (STR)  Patient Reported Information How did you hear about [place]? Self  Referral name: No data recorded Referral phone number: No data recorded  Whom do you see for routine medical problems? No data recorded Practice/Facility Name: No data recorded Practice/Facility Phone Number: No data recorded Name of Contact: No data recorded Contact Number: No data recorded Contact Fax Number: No data recorded Prescriber Name: No data recorded Prescriber Address (if known): No data recorded  What Is the Reason for Your Visit/Call Today? Pt to ED With wife. States he needs help with depression and alcohol. Denies si/hi.Per wife, pt has been telling her he wants to harm himself. Denies plan. Prozac was doubled yesterday. Pt answering orientation questions appropriately, however has  confused look on face when being talked to  Wife seems very upset and frustrated in triage  How Long Has This Been Causing You Problems? > than 6 months  What Do You Feel Would Help You the Most Today? Alcohol or Drug Use Treatment   Have You Recently Been in Any Inpatient Treatment (Hospital/Detox/Crisis Center/28-Day Program)? No data recorded Name/Location of Program/Hospital:No data recorded How Long Were You There? No data recorded When Were You Discharged? No data recorded  Have You Ever Received Services From [facility] Before? No data recorded Who Do You See at [facility]? No data recorded  Have You Recently Had Any Thoughts About Hurting Yourself? No  Are You Planning to Commit Suicide/Harm Yourself At This time? No   Have you Recently Had Thoughts About [place]? No  Explanation: No data recorded  Have You Used Any Alcohol or Drugs in the Past 24 Hours? Yes  How Long Ago Did You Use Drugs or Alcohol? No data recorded What Did You Use and How Much? Alcohol   Do You Currently Have a Therapist/Psychiatrist? No  Name of Therapist/Psychiatrist: Pt reported, "I see a counselor in [place]"   Have You Been Recently Discharged From Any Office Practice or Programs? No  Explanation of Discharge From Practice/Program: No data recorded    CCA Screening Triage Referral Assessment Type of Contact: Face-to-Face  Is this Initial or Reassessment? No data recorded Date Telepsych consult ordered in [facility]:  No data recorded Time Telepsych consult ordered in [facility]:  No data recorded  Patient Reported Information Reviewed? No data recorded Patient Left Without Being Seen? No data recorded Reason  for Not Completing Assessment: No data recorded  Collateral Involvement: Pt's AA sponsor "John"   Does Patient Have a [place]? No data recorded Name and Contact of Legal Guardian: No data recorded If Minor and Not Living with Parent(s), Who has  Custody? n/a  Is CPS involved or ever been involved? Never  Is APS involved or ever been involved? Never   Patient Determined To Be At Risk for Harm To Self or Others Based on Review of Patient Reported Information or Presenting Complaint? No  Method: No data recorded Availability of Means: No data recorded Intent: No data recorded Notification Required: No data recorded Additional Information for Danger to Others Potential: No data recorded Additional Comments for Danger to Others Potential: No data recorded Are There Guns or Other Weapons in Your Home? No data recorded Types of Guns/Weapons: No data recorded Are These Weapons Safely Secured?                            No data recorded Who Could Verify You Are Able To Have These Secured: No data recorded Do You Have any Outstanding Charges, Pending Court Dates, Parole/Probation? No data recorded Contacted To Inform of Risk of Harm To Self or Others: Other: Comment   Location of Assessment: [facility]   Does Patient Present under Involuntary Commitment? No  IVC Papers Initial File Date: No data recorded  [place] of Residence: [place]   Patient Currently Receiving the Following Services: Individual Therapy   Determination of Need: Emergent (2 hours)   Options For Referral: Therapeutic Triage Services; ED Referral; ED Visit     CCA Biopsychosocial Intake/Chief Complaint:  No data recorded Current Symptoms/Problems: No data recorded  Patient Reported Schizophrenia/Schizoaffective Diagnosis in Past: No   Strengths: Pt has good insight, stable housing, a good support network  Preferences: No data recorded Abilities: No data recorded  Type of Services Patient Feels are Needed: No data recorded  Initial Clinical Notes/Concerns: No data recorded  Mental Health Symptoms Depression:   Worthlessness; Tearfulness; Hopelessness   Duration of Depressive symptoms:  Greater than two weeks   Mania:   N/A   Anxiety:     Worrying; Tension; Difficulty concentrating   Psychosis:   None   Duration of Psychotic symptoms: No data recorded  Trauma:   Guilt/shame; Emotional numbing   Obsessions:   Cause anxiety; Recurrent & persistent thoughts/impulses/images; Attempts to suppress/neutralize; Disrupts routine/functioning; Intrusive/time consuming; Good insight   Compulsions:   Good insight; "Driven" to perform behaviors/acts; Intended to reduce stress or prevent another outcome; Disrupts with routine/functioning; Repeated behaviors/mental acts; Intrusive/time consuming   Inattention:   None   Hyperactivity/Impulsivity:   None   Oppositional/Defiant Behaviors:   None   Emotional Irregularity:   Unstable self-image; Intense/unstable relationships; Frantic efforts to avoid abandonment; Mood lability   Other Mood/Personality Symptoms:  No data recorded   Mental Status Exam Appearance and self-care  Stature:   Average   Weight:   Overweight   Clothing:   Neat/clean   Grooming:   Normal   Cosmetic use:   None   Posture/gait:   Normal   Motor activity:   Not Remarkable   Sensorium  Attention:   Normal   Concentration:   Normal   Orientation:   Situation; Person; Object; Place   Recall/memory:   Normal   Affect and Mood  Affect:   Full Range   Mood:   Euthymic  Relating  Eye contact:   Normal   Facial expression:   Responsive   Attitude toward examiner:   Cooperative   Thought and Language  Speech flow:  Clear and Coherent   Thought content:   Appropriate to Mood and Circumstances   Preoccupation:   None   Hallucinations:   None   Organization:  No data recorded  [employer] of Knowledge:   Average   Intelligence:   Average   Abstraction:   Normal   Judgement:   Poor   Reality Testing:   Adequate   Insight:   Present; Good   Decision Making:   Impulsive   Social Functioning  Social Maturity:   Responsible    Social Judgement:   Normal   Stress  Stressors:   Transitions   Coping Ability:   Exhausted   Skill Deficits:   Decision making   Supports:   Family; Friends/Service system; Support needed     Religion: Religion/Spirituality Are You A Religious Person?: Yes What is Your Religious Affiliation?:  (Judaism) How Might This Affect Treatment?: Pt reported being grounded in his religious beliefs.  Leisure/Recreation: Leisure / Recreation Do You Have Hobbies?: No  Exercise/Diet: Exercise/Diet Do You Exercise?: No Have You Gained or Lost A Significant Amount of Weight in the Past Six Months?: No Do You Follow a Special Diet?: No Do You Have Any Trouble Sleeping?: Yes Explanation of Sleeping Difficulties: Pt reported that he only sleeps 2 hours at night; often wakes up in night terrors/anxiety attacks.   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, government has Been Impacted by Current Illness: No Has Patient ever Been in Passenger transport manager?: No  Education: Education Is Patient Currently Attending School?: No Last Grade Completed:  (Not assessed) Did Physicist, medical?:  (Not assessed) Did You Have An Individualized Education Program (IIEP): No Did You Have Any Difficulty At School?: No   CCA Family/Childhood History Family and Relationship History: Family history Marital status: Married Number of Years Married: 22 What types of issues is patient dealing with in the relationship?: Pt's marriage is strained due to his secrecy about his drinking habits. Additional relationship information: Pt has feelings of extreme guilt and shame when he drinks and hides it from his wife. Does patient have children?:  (UTA)  Childhood History:  Childhood History By whom was/is the patient raised?: Mother Did patient suffer any verbal/emotional/physical/sexual abuse as a child?: No Did patient suffer from severe  childhood neglect?: No Has patient ever been sexually abused/assaulted/raped as an adolescent or adult?: No Was the patient ever a victim of a crime or a disaster?: No Witnessed domestic violence?: No Has patient been affected by domestic violence as an adult?: No  Child/Adolescent Assessment:     CCA Substance Use Alcohol/Drug Use: Alcohol / Drug Use Pain Medications: See MAR Prescriptions: See MAR Over the Counter: See MAR History of alcohol / drug use?: Yes Longest period of sobriety (when/how long): Alcohol Negative Consequences of Use: Personal relationships Withdrawal Symptoms: None Substance #1 Name of Substance 1: Alcohol                       ASAM's:  Six Dimensions of Multidimensional Assessment  Dimension 1:  Acute Intoxication and/or Withdrawal Potential:   Dimension 1:  Description of individual's past and current experiences of substance use and withdrawal: Pt has a hx of chronic acohol abuse  Dimension 2:  Biomedical Conditions  and Complications:   Dimension 2:  Description of patient's biomedical conditions and  complications: Pt has a missing spleen  Dimension 3:  Emotional, Behavioral, or Cognitive Conditions and Complications:  Dimension 3:  Description of emotional, behavioral, or cognitive conditions and complications: Pt has a hx of anxiety and depression  Dimension 4:  Readiness to Change:     Dimension 5:  Relapse, Continued use, or Continued Problem Potential:     Dimension 6:  Recovery/Living Environment:     ASAM Severity Score: ASAM's Severity Rating Score: 18  ASAM Recommended Level of Treatment: ASAM Recommended Level of Treatment: Level III Residential Treatment   Substance use Disorder (SUD) Substance Use Disorder (SUD)  Checklist Symptoms of Substance Use: Continued use despite having a persistent/recurrent physical/psychological problem caused/exacerbated by use, Continued use despite persistent or recurrent social, interpersonal  problems, caused or exacerbated by use, Persistent desire or unsuccessful efforts to cut down or control use, Presence of craving or strong urge to use, Substance(s) often taken in larger amounts or over longer times than was intended, Large amounts of time spent to obtain, use or recover from the substance(s)  Recommendations for Services/Supports/Treatments: Recommendations for Services/Supports/Treatments Recommendations For Services/Supports/Treatments: Detox, Inpatient Hospitalization  DSM5 Diagnoses: Patient Active Problem List   Diagnosis Date Noted   Alcohol abuse    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   Chronic pulmonary embolism ([place]) 09/15/2020   GERD (gastroesophageal reflux disease) 09/14/2020   Hyperlipidemia 09/14/2020   Primary osteoarthritis of left hip 08/24/2020   Insomnia 01/07/2019   Depression, prolonged 01/07/2019   Asplenia 08/11/2017   Heart palpitations 04/01/2017   PVC's (premature ventricular contractions) 02/14/2017   Atrial fibrillation with rapid ventricular response ([place]) 06/26/2016   Chronic insomnia 05/07/2016   Status post catheter ablation of atrial fibrillation 03/21/2016   Chest pain with high risk for cardiac etiology 09/08/2015   Hypokalemia 12/30/2014   Essential hypertension 12/21/2014   Paroxysmal atrial fibrillation ([place]) 12/21/2014   History of cardiac cath 10/13/2014   Glaucoma 08/24/2014   Type 2 diabetes mellitus without complications ([place]) 00/92/3300   Barrett's esophagus determined by biopsy 07/19/2014   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   Thyroid nodule 07/12/2014   Weight gain, abnormal 07/12/2014   Incisional hernia without mention of obstruction or gangrene 04/20/2014   Incisional hernia without obstruction or gangrene 03/22/2014    Patient Centered Plan: Patient is on the following Treatment Plan(s):  Impulse Control and Substance Abuse   Referrals to Alternative Service(s): Referred to  Alternative Service(s):   Place:   Date:   Time:    Referred to Alternative Service(s):   Place:   Date:   Time:    Referred to Alternative Service(s):   Place:   Date:   Time:    Referred to Alternative Service(s):   Place:   Date:   Time:      '@[facility]COLLABOFCARE'$ @  [employer], LCAS-A

## 2022-08-29 NOTE — ED Provider Notes (Addendum)
Emergency Medicine Observation Re-evaluation Note  Keith Lesch. is a 71 y.o. male, seen on rounds today.  Pt initially presented to the ED for complaints of Alcohol Problem Currently, the patient is resting, voices no medical complaints.  Physical Exam  BP 128/74   Pulse 64   Temp 98 F (36.7 C)   Resp 18   Ht '5\' 9"'$  (1.753 m)   Wt 97.5 kg   SpO2 95%   BMI 31.75 kg/m  Physical Exam General: Resting in no acute distress Cardiac: No cyanosis Lungs: Equal rise and fall Psych: Not agitated  ED Course / MDM  EKG:   I have reviewed the labs performed to date as well as medications administered while in observation.  Recent changes in the last 24 hours include no events overnight.  Plan  Current plan is for psychiatric disposition.    Paulette Blanch, MD 08/29/22 1829    Paulette Blanch, MD 08/29/22 [phone]

## 2022-08-29 NOTE — ED Notes (Signed)
VOL/ PENDING DISPOSITION

## 2022-08-29 NOTE — ED Notes (Signed)
Lopez, [place] Spouse [phone]   [phone]    States that she will come pick up patient from Emergency Room.

## 2022-08-29 NOTE — ED Notes (Signed)
Pt states that his wife took home all of his belongings. Pt given paper scrubs to change into. Pt continues to deny SI and HI. States that he feels safe at home. Admits to drinking "3-4 little liquor bottles" a day but drank "more than that yesterday". Denies withdrawal symptoms currently.
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
## 2022-10-14 ENCOUNTER — Encounter (INDEPENDENT_AMBULATORY_CARE_PROVIDER_SITE_OTHER): Payer: Self-pay

## 2023-01-16 ENCOUNTER — Ambulatory Visit: Payer: Self-pay | Admitting: Nurse Practitioner

## 2023-09-18 ENCOUNTER — Encounter: Payer: Self-pay | Admitting: Ophthalmology

## 2023-09-22 ENCOUNTER — Encounter: Payer: Self-pay | Admitting: Ophthalmology

## 2023-09-22 NOTE — Anesthesia Preprocedure Evaluation (Addendum)
Anesthesia Evaluation  Patient identified by MRN, date of birth, ID band Patient awake    Reviewed: Allergy & Precautions, H&P , NPO status , Patient's Chart, lab work & pertinent test results  Airway Mallampati: III  TM Distance: <3 FB Neck ROM: Full    Dental no notable dental hx.    Pulmonary shortness of breath, sleep apnea , pneumonia Hx pulmonary embolus   Pulmonary exam normal breath sounds clear to auscultation       Cardiovascular hypertension, Pt. on home beta blockers and Pt. on medications Normal cardiovascular exam+ dysrhythmias + Valvular Problems/Murmurs  Rhythm:Regular Rate:Normal  Atrial fibrillation  08-31-15 Left ventricle: The cavity size was mildly dilated. Systolic    function was normal. The estimated ejection fraction was in the    range of 55% to 65%. Wall motion was normal; there were no    regional wall motion abnormalities.      Neuro/Psych  PSYCHIATRIC DISORDERS  Depression    negative neurological ROS  negative psych ROS   GI/Hepatic Neg liver ROS,GERD  ,,Barretts esophagus    Endo/Other  diabetes Hyperthyroidism   Renal/GU negative Renal ROS  negative genitourinary   Musculoskeletal  (+) Arthritis ,    Abdominal   Peds negative pediatric ROS (+)  Hematology negative hematology ROS (+)   Anesthesia Other Findings   Diabetes mellitus  GERD (gastroesophageal reflux disease) Hyperlipidemia  Hypertension Thyroid disease  Atrial fibrillation (HCC) Chronic back pain  Barrett's esophagus Depression  Diverticulitis Diverticulosis  Pulmonary embolus (HCC) Hyperthyroidism  glaucoma Hypoglycemia Irritable bowel syndrome  History of kidney stones Morbid obesity (HCC) Sinusitis Thyroid nodule  IBS (irritable bowel syndrome) Cancer  Heart murmur Pneumonia  Dysrhythmia Arthritis  Sleep apnea Dyspnea      Reproductive/Obstetrics negative OB ROS                              Anesthesia Physical Anesthesia Plan  ASA: 3  Anesthesia Plan: MAC   Post-op Pain Management:    Induction: Intravenous  PONV Risk Score and Plan:   Airway Management Planned: Natural Airway and Nasal Cannula  Additional Equipment:   Intra-op Plan:   Post-operative Plan:   Informed Consent: I have reviewed the patients History and Physical, chart, labs and discussed the procedure including the risks, benefits and alternatives for the proposed anesthesia with the patient or authorized representative who has indicated his/her understanding and acceptance.     Dental Advisory Given  Plan Discussed with: Anesthesiologist, CRNA and Surgeon  Anesthesia Plan Comments: (Patient consented for risks of anesthesia including but not limited to:  - adverse reactions to medications - damage to eyes, teeth, lips or other oral mucosa - nerve damage due to positioning  - sore throat or hoarseness - Damage to heart, brain, nerves, lungs, other parts of body or loss of life  Patient voiced understanding and assent.)        Anesthesia Quick Evaluation

## 2023-09-26 NOTE — Discharge Instructions (Signed)

## 2023-09-29 ENCOUNTER — Ambulatory Visit
Admission: RE | Admit: 2023-09-29 | Discharge: 2023-09-29 | Disposition: A | Discharge status: Home / Self Care | Payer: Medicare PPO | Attending: Ophthalmology | Admitting: Ophthalmology

## 2023-09-29 ENCOUNTER — Encounter
Admission: RE | Disposition: A | Discharge status: Home / Self Care | Payer: Self-pay | Source: Home / Self Care | Attending: Ophthalmology

## 2023-09-29 ENCOUNTER — Ambulatory Visit: Payer: Medicare PPO | Admitting: Anesthesiology

## 2023-09-29 ENCOUNTER — Encounter: Payer: Self-pay | Admitting: Ophthalmology

## 2023-09-29 ENCOUNTER — Other Ambulatory Visit: Payer: Self-pay

## 2023-09-29 DIAGNOSIS — K589 Irritable bowel syndrome without diarrhea: Secondary | ICD-10-CM | POA: Insufficient documentation

## 2023-09-29 DIAGNOSIS — H2512 Age-related nuclear cataract, left eye: Secondary | ICD-10-CM | POA: Insufficient documentation

## 2023-09-29 DIAGNOSIS — E1136 Type 2 diabetes mellitus with diabetic cataract: Secondary | ICD-10-CM | POA: Diagnosis present

## 2023-09-29 DIAGNOSIS — Z86711 Personal history of pulmonary embolism: Secondary | ICD-10-CM | POA: Diagnosis not present

## 2023-09-29 DIAGNOSIS — G4730 Sleep apnea, unspecified: Secondary | ICD-10-CM | POA: Insufficient documentation

## 2023-09-29 DIAGNOSIS — I10 Essential (primary) hypertension: Secondary | ICD-10-CM | POA: Insufficient documentation

## 2023-09-29 DIAGNOSIS — E785 Hyperlipidemia, unspecified: Secondary | ICD-10-CM | POA: Insufficient documentation

## 2023-09-29 DIAGNOSIS — I4891 Unspecified atrial fibrillation: Secondary | ICD-10-CM | POA: Diagnosis not present

## 2023-09-29 DIAGNOSIS — Z6834 Body mass index (BMI) 34.0-34.9, adult: Secondary | ICD-10-CM | POA: Insufficient documentation

## 2023-09-29 DIAGNOSIS — H401121 Primary open-angle glaucoma, left eye, mild stage: Secondary | ICD-10-CM | POA: Insufficient documentation

## 2023-09-29 DIAGNOSIS — K219 Gastro-esophageal reflux disease without esophagitis: Secondary | ICD-10-CM | POA: Diagnosis not present

## 2023-09-29 HISTORY — PX: CATARACT EXTRACTION W/PHACO: SHX586

## 2023-09-29 HISTORY — DX: Sleep apnea, unspecified: G47.30

## 2023-09-29 HISTORY — DX: Other specified postprocedural states: Z98.890

## 2023-09-29 HISTORY — DX: Dyspnea, unspecified: R06.00

## 2023-09-29 HISTORY — DX: Unspecified glaucoma: H40.9

## 2023-09-29 LAB — GLUCOSE, CAPILLARY: Glucose-Capillary: 97 mg/dL (ref 70–99)

## 2023-09-29 SURGERY — PHACOEMULSIFICATION, CATARACT, WITH IOL INSERTION
Anesthesia: Monitor Anesthesia Care | Laterality: Left

## 2023-09-29 MED ORDER — MIDAZOLAM HCL 2 MG/2ML IJ SOLN
INTRAMUSCULAR | Status: AC
  Filled 2023-09-29: qty 2

## 2023-09-29 MED ORDER — FENTANYL CITRATE (PF) 100 MCG/2ML IJ SOLN
INTRAMUSCULAR | Status: AC
  Filled 2023-09-29: qty 2

## 2023-09-29 MED ORDER — ARMC OPHTHALMIC DILATING DROPS
OPHTHALMIC | Status: AC
  Filled 2023-09-29: qty 0.5

## 2023-09-29 MED ORDER — MIDAZOLAM HCL 2 MG/2ML IJ SOLN
INTRAMUSCULAR | Status: DC | PRN
  Administered 2023-09-29 (×2): 1 mg via INTRAVENOUS

## 2023-09-29 MED ORDER — SIGHTPATH DOSE#1 BSS IO SOLN
INTRAOCULAR | Status: DC | PRN
  Administered 2023-09-29: 89 mL via OPHTHALMIC

## 2023-09-29 MED ORDER — EPHEDRINE 5 MG/ML INJ
INTRAVENOUS | Status: AC
  Filled 2023-09-29: qty 5

## 2023-09-29 MED ORDER — TETRACAINE HCL 0.5 % OP SOLN
OPHTHALMIC | Status: AC
  Filled 2023-09-29: qty 4

## 2023-09-29 MED ORDER — SIGHTPATH DOSE#1 NA HYALUR & NA CHOND-NA HYALUR IO KIT
PACK | INTRAOCULAR | Status: DC | PRN
  Administered 2023-09-29: 1 via OPHTHALMIC

## 2023-09-29 MED ORDER — SODIUM CHLORIDE 0.9 % IV SOLN
INTRAVENOUS | Status: DC | PRN
Start: 2023-09-29 — End: 2023-09-29

## 2023-09-29 MED ORDER — LACTATED RINGERS IV SOLN: INTRAVENOUS | Status: DC

## 2023-09-29 MED ORDER — LIDOCAINE HCL (PF) 2 % IJ SOLN
INTRAOCULAR | Status: DC | PRN
  Administered 2023-09-29: 1 mL via INTRAOCULAR

## 2023-09-29 MED ORDER — PHENYLEPHRINE 80 MCG/ML (10ML) SYRINGE FOR IV PUSH (FOR BLOOD PRESSURE SUPPORT)
PREFILLED_SYRINGE | INTRAVENOUS | Status: AC
  Filled 2023-09-29: qty 10

## 2023-09-29 MED ORDER — SODIUM CHLORIDE 0.9% FLUSH: 10.0000 mL | INTRAVENOUS | Status: DC | PRN

## 2023-09-29 MED ORDER — TETRACAINE HCL 0.5 % OP SOLN
1.0000 [drp] | OPHTHALMIC | Status: DC | PRN
  Administered 2023-09-29 (×3): 1 [drp] via OPHTHALMIC

## 2023-09-29 MED ORDER — SIGHTPATH DOSE#1 BSS IO SOLN
INTRAOCULAR | Status: DC | PRN
  Administered 2023-09-29: 15 mL via INTRAOCULAR

## 2023-09-29 MED ORDER — PHENYLEPHRINE HCL (PRESSORS) 10 MG/ML IV SOLN
INTRAVENOUS | Status: DC | PRN
Start: 2023-09-29 — End: 2023-09-29
  Administered 2023-09-29: 160 ug via INTRAVENOUS
  Administered 2023-09-29 (×4): 80 ug via INTRAVENOUS

## 2023-09-29 MED ORDER — FENTANYL CITRATE (PF) 100 MCG/2ML IJ SOLN
INTRAMUSCULAR | Status: DC | PRN
  Administered 2023-09-29 (×2): 50 ug via INTRAVENOUS

## 2023-09-29 MED ORDER — ARMC OPHTHALMIC DILATING DROPS
1.0000 | OPHTHALMIC | Status: DC | PRN
  Administered 2023-09-29 (×3): 1 via OPHTHALMIC

## 2023-09-29 MED ORDER — EPHEDRINE SULFATE (PRESSORS) 50 MG/ML IJ SOLN
INTRAMUSCULAR | Status: DC | PRN
Start: 2023-09-29 — End: 2023-09-29
  Administered 2023-09-29 (×4): 5 mg via INTRAVENOUS

## 2023-09-29 MED ORDER — MOXIFLOXACIN HCL 0.5 % OP SOLN
OPHTHALMIC | Status: DC | PRN
  Administered 2023-09-29: .2 mL via OPHTHALMIC

## 2023-09-29 SURGICAL SUPPLY — 13 items
CATARACT SUITE SIGHTPATH (MISCELLANEOUS) ×1
DISSECTOR HYDRO NUCLEUS 50X22 (MISCELLANEOUS) ×1 IMPLANT
FEE CATARACT SUITE SIGHTPATH (MISCELLANEOUS) ×1 IMPLANT
GLOVE SURG GAMMEX PI TX LF 7.5 (GLOVE) ×1 IMPLANT
GLOVE SURG SYN 8.5 E (GLOVE) ×1
GLOVE SURG SYN 8.5 PF PI (GLOVE) ×1 IMPLANT
IVANTIS HYDRUS MICROSTENT (Stent) ×1 IMPLANT
LENS IOL TECNIS EYHANCE 21.0 (Intraocular Lens) IMPLANT
MICROSTENT IVANTIS HYDRUS (Stent) IMPLANT
NDL FILTER BLUNT 18X1 1/2 (NEEDLE) ×1 IMPLANT
NEEDLE FILTER BLUNT 18X1 1/2 (NEEDLE) ×1
SYR 3ML LL SCALE MARK (SYRINGE) ×1 IMPLANT
SYR 5ML LL (SYRINGE) ×1 IMPLANT

## 2023-09-29 NOTE — Transfer of Care (Signed)
Immediate Anesthesia Transfer of Care Note  Patient: [name].  Procedure(s) Performed: CATARACT EXTRACTION PHACO AND INTRAOCULAR LENS PLACEMENT (IOC) LEFT HYDRUS MICROSTENT DIABETIC 6.11 00:41.0 (Left)  Patient Location: PACU  Anesthesia Type: MAC  Level of Consciousness: awake, alert  and patient cooperative  Airway and Oxygen Therapy: Patient Spontanous Breathing and Patient connected to supplemental oxygen  Post-op Assessment: Post-op Vital signs reviewed, Patient's Cardiovascular Status Stable, Respiratory Function Stable, Patent Airway and No signs of Nausea or vomiting  Post-op Vital Signs: Reviewed and stable  Complications: No notable events documented.

## 2023-09-29 NOTE — Progress Notes (Signed)
Patient was hypotensive upon arrival to PACU from the OR.. Patient was asymptomatic, CRNA Inda treated pressure, Dr. Juel Burrow gave orders for bolus of 250 cc. Patient did receive a total of 340 cc of fluid. See flowsheets for vital signs.   0845 per Dr. Juel Burrow patient is okay to be discharged home. Has remained asymptomatic and vitals have since stabilized with appropriate treatment.

## 2023-09-29 NOTE — Op Note (Signed)
OPERATIVE NOTE  [name] [id number] 09/29/2023  PREOPERATIVE DIAGNOSIS:   1.  Mild  PRIMARY open angle glaucoma, left eye. W09.8119  2.  Nuclear sclerotic cataract left eye.  H25.12   POSTOPERATIVE DIAGNOSIS:    same.   PROCEDURE:   1.  Placement of trabecular bypass stent (hydrus) and phacoemusification with posterior chamber intraocular lens placement of the left eye  CPT [phone]   LENS: Implant Name Type Inv. Item Serial No. Manufacturer Lot No. LRB No. Used Action  Will Bonnet MICROSTENT - [id number] Stent IVANTIS HYDRUS MICROSTENT  [facility] [id number] Left 1 Implanted  LENS IOL TECNIS EYHANCE 21.0 - [id number] Intraocular Lens LENS IOL TECNIS EYHANCE 21.0 [id number] [employer]  Left 1 Implanted      Procedure(s): CATARACT EXTRACTION PHACO AND INTRAOCULAR LENS PLACEMENT (IOC) LEFT HYDRUS MICROSTENT DIABETIC 6.11 00:41.0 (Left)    SURGEON:  Willey Blade, MD, MPH  ANESTHESIOLOGIST: Anesthesiologist: Marisue Humble, MD CRNA: [place], [place], CRNA   ANESTHESIA:  MAC  and intracameral preservative-free intracameral lidocaine 4%.  ESTIMATED BLOOD LOSS: less than 1 mL.   COMPLICATIONS:  None.   DESCRIPTION OF PROCEDURE:  The patient was identified in the holding room and transported to the operating room.  The patient was placed in the supine position under the operating microscope.  The left eye was prepped and draped in the usual sterile ophthalmic fashion.   A 1.0 millimeter clear-corneal paracentesis was made at the 4:30 position, and a second paracentesis was made at 1:30 for the hydrus. 0.5 ml of preservative-free 1% lidocaine with epinephrine was injected into the anterior chamber.  The anterior chamber was filled with Healon 5 viscoelastic.  A 2.4 millimeter keratome was used to make a near-clear corneal incision at the 2:00 position.   Attention was turned to the hydrus stent.  The patients head was turned to the left and the microscope was tilted to 035  degrees.  Ocular instruments/Glaukos OAL/H2 gonioprism was used with Healon 5 on the cornea to visualize the trabecular meshwork. The hydrus was introduced into the eye and the  meshwork was engaged with the tip of the and the stent was deployed into Schlemm's canal.  The stent was well seated and in good position.  Next, attention was turned to the phacoemulsification A curvilinear capsulorrhexis was made with a cystotome and capsulorrhexis forceps.  Balanced salt solution was used to hydrodissect and hydrodelineate the nucleus.   Phacoemulsification was then used in stop and chop fashion to remove the lens nucleus and epinucleus.  The remaining cortex was then removed using the irrigation and aspiration handpiece. Healon was then placed into the capsular bag to distend it for lens placement.  A lens was then injected into the capsular bag.  The remaining viscoelastic was aspirated.   Wounds were hydrated with balanced salt solution.  The anterior chamber was inflated to a physiologic pressure with balanced salt solution.   Intracameral vigamox 0.1 mL undiluted was injected into the eye and a drop placed onto the ocular surface.  No wound leaks were noted.  Protective glasses were placed on the patient.  The patient was taken to the recovery room in stable condition without complications of anesthesia or surgery   Willey Blade 09/29/2023, 7:57 AM
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
## 2023-09-29 NOTE — Anesthesia Postprocedure Evaluation (Signed)
Anesthesia Post Note  Patient: [name].  Procedure(s) Performed: CATARACT EXTRACTION PHACO AND INTRAOCULAR LENS PLACEMENT (IOC) LEFT HYDRUS MICROSTENT DIABETIC 6.11 00:41.0 (Left)  Patient location during evaluation: PACU Anesthesia Type: MAC Level of consciousness: awake and alert Pain management: pain level controlled Vital Signs Assessment: post-procedure vital signs reviewed and stable Respiratory status: spontaneous breathing, nonlabored ventilation, respiratory function stable and patient connected to nasal cannula oxygen Cardiovascular status: stable and blood pressure returned to baseline Postop Assessment: no apparent nausea or vomiting Anesthetic complications: no   No notable events documented.   Last Vitals:  Vitals:   09/29/23 0840 09/29/23 0842  BP: (!) 105/56 (!) 102/59  Pulse: 69 70  Resp: 15 13  Temp:    SpO2: 96% 96%    Last Pain:  Vitals:   09/29/23 0842  TempSrc:   PainSc: 0-No pain                 Marisue Humble

## 2023-09-29 NOTE — Addendum Note (Signed)
Addendum  created 09/29/23 1033 by Solon Augusta [place], CRNA   Attestation recorded in [place] accepted, Proofreader deleted, Proofreader filed

## 2023-09-30 ENCOUNTER — Encounter: Payer: Self-pay | Admitting: Ophthalmology

## 2023-10-25 NOTE — Anesthesia Preprocedure Evaluation (Addendum)
Anesthesia Evaluation  Patient identified by MRN, date of birth, ID band Patient awake    Reviewed: Allergy & Precautions, H&P , NPO status , Patient's Chart, lab work & pertinent test results  Airway Mallampati: III  TM Distance: <3 FB Neck ROM: Full    Dental no notable dental hx.    Pulmonary shortness of breath, sleep apnea , pneumonia Hx pulmonary embolus   Pulmonary exam normal breath sounds clear to auscultation       Cardiovascular hypertension, Normal cardiovascular exam+ dysrhythmias + Valvular Problems/Murmurs  Rhythm:Regular Rate:Normal  Atrial fibrillation   08-31-15 Left ventricle: The cavity size was mildly dilated. Systolic    function was normal. The estimated ejection fraction was in the    range of 55% to 65%. Wall motion was normal; there were no    regional wall motion abnormalities.      Holter monitor report from 10/10/23 to 10/17/23 one run of V tach, 22 occurrences of SVT, longest episode 16.1  seconds Mx heart rate was 194 bmp on 10/252/24, minimum 45 bmp on 10/14/23, average 69 bpm. No atrial fibrillation.   Neuro/Psych  PSYCHIATRIC DISORDERS  Depression    negative neurological ROS  negative psych ROS   GI/Hepatic Neg liver ROS,GERD  ,,  Endo/Other  diabetes Hyperthyroidism   Renal/GU negative Renal ROS  negative genitourinary   Musculoskeletal  (+) Arthritis ,    Abdominal   Peds negative pediatric ROS (+)  Hematology negative hematology ROS (+)   Anesthesia Other Findings Medical History  Diabetes mellitus  GERD (gastroesophageal reflux disease) Hyperlipidemia  Hypertension Thyroid disease  Atrial fibrillation (HCC) Chronic back pain  Barrett's esophagus Depression  Diverticulitis Diverticulosis  Pulmonary embolus (HCC) Hyperthyroidism  Hypoglycemia Irritable bowel syndrome  History of kidney stones Morbid obesity  Sinusitis Thyroid nodule  IBS (irritable bowel  syndrome) Cancer (HCC) Heart murmur Pneumonia  Dysrhythmia Arthritis  Sleep apnea Dyspnea  Status post catheter ablation of atrial fibrillation Glaucoma   Previous cataract surgery 09-29-23 Reports "BP dropped last time" see old record from September 29, 2023   Reproductive/Obstetrics negative OB ROS                             Anesthesia Physical Anesthesia Plan  ASA: 3  Anesthesia Plan: MAC   Post-op Pain Management:    Induction: Intravenous  PONV Risk Score and Plan:   Airway Management Planned: Natural Airway and Nasal Cannula  Additional Equipment:   Intra-op Plan:   Post-operative Plan:   Informed Consent: I have reviewed the patients History and Physical, chart, labs and discussed the procedure including the risks, benefits and alternatives for the proposed anesthesia with the patient or authorized representative who has indicated his/her understanding and acceptance.     Dental Advisory Given  Plan Discussed with: Anesthesiologist, CRNA and Surgeon  Anesthesia Plan Comments: (Patient consented for risks of anesthesia including but not limited to:  - adverse reactions to medications - damage to eyes, teeth, lips or other oral mucosa - nerve damage due to positioning  - sore throat or hoarseness - Damage to heart, brain, nerves, lungs, other parts of body or loss of life  Patient voiced understanding and assent.)        Anesthesia Quick Evaluation

## 2023-10-27 ENCOUNTER — Encounter: Payer: Self-pay | Admitting: Ophthalmology

## 2023-10-31 NOTE — Discharge Instructions (Signed)

## 2023-11-03 ENCOUNTER — Ambulatory Visit: Payer: Medicare PPO | Admitting: Anesthesiology

## 2023-11-03 ENCOUNTER — Ambulatory Visit
Admission: RE | Admit: 2023-11-03 | Discharge: 2023-11-03 | Disposition: A | Discharge status: Home / Self Care | Payer: Medicare PPO | Attending: Ophthalmology | Admitting: Ophthalmology

## 2023-11-03 ENCOUNTER — Encounter: Payer: Self-pay | Admitting: Ophthalmology

## 2023-11-03 ENCOUNTER — Other Ambulatory Visit: Payer: Self-pay

## 2023-11-03 ENCOUNTER — Encounter
Admission: RE | Disposition: A | Discharge status: Home / Self Care | Payer: Self-pay | Source: Home / Self Care | Attending: Ophthalmology

## 2023-11-03 DIAGNOSIS — Z7901 Long term (current) use of anticoagulants: Secondary | ICD-10-CM | POA: Insufficient documentation

## 2023-11-03 DIAGNOSIS — I10 Essential (primary) hypertension: Secondary | ICD-10-CM | POA: Insufficient documentation

## 2023-11-03 DIAGNOSIS — H2511 Age-related nuclear cataract, right eye: Secondary | ICD-10-CM | POA: Insufficient documentation

## 2023-11-03 DIAGNOSIS — H401111 Primary open-angle glaucoma, right eye, mild stage: Secondary | ICD-10-CM | POA: Insufficient documentation

## 2023-11-03 DIAGNOSIS — I4891 Unspecified atrial fibrillation: Secondary | ICD-10-CM | POA: Diagnosis not present

## 2023-11-03 DIAGNOSIS — Z86711 Personal history of pulmonary embolism: Secondary | ICD-10-CM | POA: Diagnosis not present

## 2023-11-03 DIAGNOSIS — G4730 Sleep apnea, unspecified: Secondary | ICD-10-CM | POA: Insufficient documentation

## 2023-11-03 DIAGNOSIS — K219 Gastro-esophageal reflux disease without esophagitis: Secondary | ICD-10-CM | POA: Diagnosis not present

## 2023-11-03 HISTORY — PX: CATARACT EXTRACTION W/PHACO: SHX586

## 2023-11-03 LAB — GLUCOSE, CAPILLARY: Glucose-Capillary: 89 mg/dL (ref 70–99)

## 2023-11-03 SURGERY — PHACOEMULSIFICATION, CATARACT, WITH IOL INSERTION
Anesthesia: Monitor Anesthesia Care | Site: Eye | Laterality: Right

## 2023-11-03 MED ORDER — MIDAZOLAM HCL 2 MG/2ML IJ SOLN
INTRAMUSCULAR | Status: DC | PRN
  Administered 2023-11-03 (×2): 1 mg via INTRAVENOUS

## 2023-11-03 MED ORDER — LIDOCAINE HCL (PF) 2 % IJ SOLN
INTRAOCULAR | Status: DC | PRN
  Administered 2023-11-03: 4 mL via INTRAOCULAR

## 2023-11-03 MED ORDER — FENTANYL CITRATE (PF) 100 MCG/2ML IJ SOLN
INTRAMUSCULAR | Status: DC | PRN
  Administered 2023-11-03: 50 ug via INTRAVENOUS

## 2023-11-03 MED ORDER — SIGHTPATH DOSE#1 BSS IO SOLN
INTRAOCULAR | Status: DC | PRN
  Administered 2023-11-03: 84 mL via OPHTHALMIC

## 2023-11-03 MED ORDER — SIGHTPATH DOSE#1 BSS IO SOLN
INTRAOCULAR | Status: DC | PRN
  Administered 2023-11-03: 15 mL via INTRAOCULAR

## 2023-11-03 MED ORDER — FENTANYL CITRATE (PF) 100 MCG/2ML IJ SOLN
INTRAMUSCULAR | Status: AC
  Filled 2023-11-03: qty 2

## 2023-11-03 MED ORDER — SIGHTPATH DOSE#1 NA HYALUR & NA CHOND-NA HYALUR IO KIT
PACK | INTRAOCULAR | Status: DC | PRN
  Administered 2023-11-03: 1 via OPHTHALMIC

## 2023-11-03 MED ORDER — ARMC OPHTHALMIC DILATING DROPS
OPHTHALMIC | Status: AC
  Filled 2023-11-03: qty 0.5

## 2023-11-03 MED ORDER — MIDAZOLAM HCL 2 MG/2ML IJ SOLN
INTRAMUSCULAR | Status: AC
Start: 2023-11-03 — End: ?
  Filled 2023-11-03: qty 2

## 2023-11-03 MED ORDER — ARMC OPHTHALMIC DILATING DROPS
1.0000 | OPHTHALMIC | Status: DC | PRN
  Administered 2023-11-03 (×3): 1 via OPHTHALMIC

## 2023-11-03 MED ORDER — TETRACAINE HCL 0.5 % OP SOLN
OPHTHALMIC | Status: AC
Start: 2023-11-03 — End: ?
  Filled 2023-11-03: qty 4

## 2023-11-03 MED ORDER — TETRACAINE HCL 0.5 % OP SOLN
1.0000 [drp] | OPHTHALMIC | Status: DC | PRN
  Administered 2023-11-03 (×3): 1 [drp] via OPHTHALMIC

## 2023-11-03 MED ORDER — MOXIFLOXACIN HCL 0.5 % OP SOLN
OPHTHALMIC | Status: DC | PRN
  Administered 2023-11-03: .2 mL via OPHTHALMIC

## 2023-11-03 SURGICAL SUPPLY — 15 items
CANNULA ANT/CHMB 27G (MISCELLANEOUS) IMPLANT
CANNULA ANT/CHMB 27GA (MISCELLANEOUS)
CATARACT SUITE SIGHTPATH (MISCELLANEOUS) ×1
DISSECTOR HYDRO NUCLEUS 50X22 (MISCELLANEOUS) ×1 IMPLANT
FEE CATARACT SUITE SIGHTPATH (MISCELLANEOUS) ×1 IMPLANT
GLOVE PI ULTRA LF STRL 7.5 (GLOVE) ×1 IMPLANT
GLOVE SURG POLYISOPRENE 8.5 (GLOVE) ×1
GLOVE SURG SYN 8.5 PF PI BL (GLOVE) ×1 IMPLANT
IVANTIS HYDRUS MICROSTENT (Stent) ×1 IMPLANT
LENS IOL TECNIS EYHANCE 20.5 (Intraocular Lens) IMPLANT
MICROSTENT IVANTIS HYDRUS (Stent) IMPLANT
NDL FILTER BLUNT 18X1 1/2 (NEEDLE) ×1 IMPLANT
NEEDLE FILTER BLUNT 18X1 1/2 (NEEDLE) ×1
SYR 3ML LL SCALE MARK (SYRINGE) ×1 IMPLANT
SYR 5ML LL (SYRINGE) ×1 IMPLANT

## 2023-11-03 NOTE — Op Note (Signed)
OPERATIVE NOTE  [name] [id number] 11/03/2023  PREOPERATIVE DIAGNOSIS:   1.  Mild PRIMARY open angle glaucoma, right eye. H40.1111  2.  Nuclear sclerotic cataract right eye.  H25.11   POSTOPERATIVE DIAGNOSIS:    same.   PROCEDURE:   1.  Placement of trabecular bypass stent (hydrus) and phacoemusification with posterior chamber intraocular lens placement of the right eye  CPT [phone]   LENS: Implant Name Type Inv. Item Serial No. Manufacturer Lot No. LRB No. Used Action  Will Bonnet MICROSTENT - [id number] Stent IVANTIS HYDRUS MICROSTENT  SIGHT[employer] [id number] Right 1 Implanted  LENS IOL TECNIS EYHANCE 20.5 - [id number] Intraocular Lens LENS IOL TECNIS EYHANCE 20.5 [id number] SIGHTPATH  Right 1 Implanted      Procedure(s): CATARACT EXTRACTION PHACO AND INTRAOCULAR LENS PLACEMENT (IOC) RIGHT DIABETIC HYDRUS MICROSTENT 5.61 00:37.9 (Right)  SURGEON:  Willey Blade, MD, MPH  ANESTHESIOLOGIST: Anesthesiologist: Marisue Humble, MD CRNA: Barbette Hair, CRNA   ANESTHESIA:  MAC and intracameral preservative-free lidocaine 4%.  ESTIMATED BLOOD LOSS: less than 1 mL.   COMPLICATIONS:  None.   DESCRIPTION OF PROCEDURE:  The patient was identified in the holding room and transported to the operating room.   The patient was placed in the supine position under the operating microscope.  The right eye was prepped and draped in the usual sterile ophthalmic fashion.   A 1.0 millimeter clear-corneal paracentesis was made at the 10:30 position and a second paracentesis at 7:00.  0.5 ml of preservative-free 1% lidocaine with epinephrine was injected into the anterior chamber.  The anterior chamber was filled with viscoelastic.  A 2.4 millimeter keratome was used to make a near-clear corneal incision at the 8:00 position.   Attention was turned to the hydrus.  The patients head was turned to the left and the microscope was tilted to 035 degrees.  Ocular instruments/Glaukos OAL/H2  gonioprism was used coupled with viscoelastic on the cornea was used to visualize the trabecular meshwork. The hydrus was opened and introduced into the eye.  The meshwork was engaged with the tip of the injector and the hydrus stent was deployed into Schlemm's canal at 4:00.  The stent was well seated and in good position.  Next, attention was turned to the phacoemulsification A curvilinear capsulorrhexis was made with a cystotome and capsulorrhexis forceps.  Balanced salt solution was used to hydrodissect and hydrodelineate the nucleus.   Phacoemulsification was then used in stop and chop fashion to remove the lens nucleus and epinucleus.  The remaining cortex was then removed using the irrigation and aspiration handpiece. Viscoelastic was then placed into the capsular bag to distend it for lens placement.  A lens was then injected into the capsular bag.  The remaining viscoelastic was aspirated.   Wounds were hydrated with balanced salt solution.  The anterior chamber was inflated to a physiologic pressure with balanced salt solution.   Intracameral vigamox 0.1 mL undiluted was injected into the eye and a drop placed onto the ocular surface.  No wound leaks were noted. The patient was taken to the recovery room in stable condition without complications of anesthesia or surgery   Willey Blade 11/03/2023, 7:55 AM
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
## 2023-11-03 NOTE — Transfer of Care (Signed)
Immediate Anesthesia Transfer of Care Note  Patient: [name].  Procedure(s) Performed: CATARACT EXTRACTION PHACO AND INTRAOCULAR LENS PLACEMENT (IOC) RIGHT DIABETIC HYDRUS MICROSTENT 5.61 00:37.9 (Right: Eye)  Patient Location: PACU  Anesthesia Type: MAC  Level of Consciousness: awake, alert  and patient cooperative  Airway and Oxygen Therapy: Patient Spontanous Breathing and Patient connected to supplemental oxygen  Post-op Assessment: Post-op Vital signs reviewed, Patient's Cardiovascular Status Stable, Respiratory Function Stable, Patent Airway and No signs of Nausea or vomiting  Post-op Vital Signs: Reviewed and stable  Complications: No notable events documented.

## 2023-11-03 NOTE — Anesthesia Postprocedure Evaluation (Signed)
Anesthesia Post Note  Patient: Heywood Dabish.  Procedure(s) Performed: CATARACT EXTRACTION PHACO AND INTRAOCULAR LENS PLACEMENT (IOC) RIGHT DIABETIC HYDRUS MICROSTENT 5.61 00:37.9 (Right: Eye)  Patient location during evaluation: PACU Anesthesia Type: MAC Level of consciousness: awake and alert Pain management: pain level controlled Vital Signs Assessment: post-procedure vital signs reviewed and stable Respiratory status: spontaneous breathing, nonlabored ventilation, respiratory function stable and patient connected to nasal cannula oxygen Cardiovascular status: stable and blood pressure returned to baseline Postop Assessment: no apparent nausea or vomiting Anesthetic complications: no   No notable events documented.   Last Vitals:  Vitals:   11/03/23 0800 11/03/23 0802  BP:  106/62  Pulse: 65 (!) 57  Resp: 18 (!) 21  Temp:  36.5 C  SpO2: 96% 96%    Last Pain:  Vitals:   11/03/23 0802  TempSrc:   PainSc: 0-No pain                 Atiana Levier C Aniello Christopoulos

## 2023-11-04 ENCOUNTER — Encounter: Payer: Self-pay | Admitting: Ophthalmology

## 2024-11-18 ENCOUNTER — Encounter: Payer: Self-pay | Admitting: Surgery
